# Patient Record
Sex: Female | Born: 1960 | Race: White | Hispanic: No | Marital: Married | Smoking: Former smoker
Health system: Southern US, Community
[De-identification: ages and names within clinical notes are randomized; demographics above are authoritative.]

## PROBLEM LIST (undated history)

## (undated) DIAGNOSIS — F329 Major depressive disorder, single episode, unspecified: Secondary | ICD-10-CM

## (undated) DIAGNOSIS — G43909 Migraine, unspecified, not intractable, without status migrainosus: Secondary | ICD-10-CM

## (undated) DIAGNOSIS — M1990 Unspecified osteoarthritis, unspecified site: Secondary | ICD-10-CM

## (undated) DIAGNOSIS — K219 Gastro-esophageal reflux disease without esophagitis: Secondary | ICD-10-CM

## (undated) DIAGNOSIS — R112 Nausea with vomiting, unspecified: Secondary | ICD-10-CM

## (undated) DIAGNOSIS — N329 Bladder disorder, unspecified: Secondary | ICD-10-CM

## (undated) DIAGNOSIS — R42 Dizziness and giddiness: Secondary | ICD-10-CM

## (undated) DIAGNOSIS — Z9889 Other specified postprocedural states: Secondary | ICD-10-CM

## (undated) DIAGNOSIS — M4802 Spinal stenosis, cervical region: Secondary | ICD-10-CM

## (undated) HISTORY — DX: Major depressive disorder, single episode, unspecified: F32.9

## (undated) HISTORY — DX: Unspecified osteoarthritis, unspecified site: M19.90

## (undated) HISTORY — DX: Gastro-esophageal reflux disease without esophagitis: K21.9

## (undated) HISTORY — PX: COLONOSCOPY: SHX174

## (undated) HISTORY — DX: Migraine, unspecified, not intractable, without status migrainosus: G43.909

---

## 1990-08-08 HISTORY — PX: OTHER SURGICAL HISTORY: SHX169

## 1997-08-08 HISTORY — PX: VAGINAL HYSTERECTOMY: SUR661

## 1998-05-14 ENCOUNTER — Inpatient Hospital Stay (HOSPITAL_COMMUNITY): Admission: RE | Admit: 1998-05-14 | Discharge: 1998-05-16 | Payer: Self-pay | Admitting: Obstetrics and Gynecology

## 1999-08-31 ENCOUNTER — Ambulatory Visit (HOSPITAL_COMMUNITY): Admission: RE | Admit: 1999-08-31 | Discharge: 1999-08-31 | Payer: Self-pay | Admitting: *Deleted

## 1999-08-31 ENCOUNTER — Encounter: Payer: Self-pay | Admitting: *Deleted

## 2000-12-26 ENCOUNTER — Encounter: Payer: Self-pay | Admitting: Family Medicine

## 2000-12-26 ENCOUNTER — Ambulatory Visit (HOSPITAL_COMMUNITY): Admission: RE | Admit: 2000-12-26 | Discharge: 2000-12-26 | Payer: Self-pay | Admitting: Family Medicine

## 2007-05-28 ENCOUNTER — Encounter: Payer: Self-pay | Admitting: Family Medicine

## 2008-06-02 ENCOUNTER — Encounter: Payer: Self-pay | Admitting: Family Medicine

## 2009-08-08 LAB — HM PAP SMEAR

## 2009-09-23 ENCOUNTER — Ambulatory Visit: Payer: Self-pay | Admitting: Family Medicine

## 2009-09-23 DIAGNOSIS — K219 Gastro-esophageal reflux disease without esophagitis: Secondary | ICD-10-CM

## 2009-09-23 DIAGNOSIS — F3289 Other specified depressive episodes: Secondary | ICD-10-CM | POA: Insufficient documentation

## 2009-09-23 DIAGNOSIS — F329 Major depressive disorder, single episode, unspecified: Secondary | ICD-10-CM

## 2009-09-23 LAB — CONVERTED CEMR LAB
Bilirubin Urine: NEGATIVE
Glucose, Urine, Semiquant: NEGATIVE
Ketones, urine, test strip: NEGATIVE
Nitrite: NEGATIVE
Protein, U semiquant: NEGATIVE
Specific Gravity, Urine: 1.025
Urobilinogen, UA: 0.2
pH: 7

## 2009-09-24 LAB — CONVERTED CEMR LAB
ALT: 17 units/L (ref 0–35)
AST: 21 units/L (ref 0–37)
Albumin: 4.3 g/dL (ref 3.5–5.2)
Alkaline Phosphatase: 63 units/L (ref 39–117)
BUN: 16 mg/dL (ref 6–23)
Basophils Absolute: 0 10*3/uL (ref 0.0–0.1)
Basophils Relative: 0.5 % (ref 0.0–3.0)
Bilirubin, Direct: 0 mg/dL (ref 0.0–0.3)
CO2: 33 meq/L — ABNORMAL HIGH (ref 19–32)
Calcium: 9.2 mg/dL (ref 8.4–10.5)
Chloride: 106 meq/L (ref 96–112)
Cholesterol: 202 mg/dL — ABNORMAL HIGH (ref 0–200)
Creatinine, Ser: 0.7 mg/dL (ref 0.4–1.2)
Direct LDL: 103.1 mg/dL
Eosinophils Absolute: 0.1 10*3/uL (ref 0.0–0.7)
Eosinophils Relative: 2.8 % (ref 0.0–5.0)
GFR calc non Af Amer: 95.34 mL/min (ref >60)
Glucose, Bld: 92 mg/dL (ref 70–99)
HCT: 37.3 % (ref 36.0–46.0)
HDL: 96.8 mg/dL (ref >39.00)
Hemoglobin: 12.6 g/dL (ref 12.0–15.0)
Lymphocytes Relative: 41.1 % (ref 12.0–46.0)
Lymphs Abs: 1.9 10*3/uL (ref 0.7–4.0)
MCHC: 33.8 g/dL (ref 30.0–36.0)
MCV: 90.3 fL (ref 78.0–100.0)
Monocytes Absolute: 0.3 10*3/uL (ref 0.1–1.0)
Monocytes Relative: 6.7 % (ref 3.0–12.0)
Neutro Abs: 2.4 10*3/uL (ref 1.4–7.7)
Neutrophils Relative %: 48.9 % (ref 43.0–77.0)
Platelets: 293 10*3/uL (ref 150.0–400.0)
Potassium: 4.2 meq/L (ref 3.5–5.1)
RBC: 4.14 M/uL (ref 3.87–5.11)
RDW: 13 % (ref 11.5–14.6)
Sodium: 143 meq/L (ref 135–145)
TSH: 0.42 microintl units/mL (ref 0.35–5.50)
Total Bilirubin: 0.3 mg/dL (ref 0.3–1.2)
Total CHOL/HDL Ratio: 2
Total Protein: 6.5 g/dL (ref 6.0–8.3)
Triglycerides: 49 mg/dL (ref 0.0–149.0)
VLDL: 9.8 mg/dL (ref 0.0–40.0)
WBC: 4.7 10*3/uL (ref 4.5–10.5)

## 2010-01-14 ENCOUNTER — Telehealth: Payer: Self-pay | Admitting: Family Medicine

## 2010-05-03 ENCOUNTER — Telehealth: Payer: Self-pay | Admitting: Family Medicine

## 2010-08-29 ENCOUNTER — Encounter: Payer: Self-pay | Admitting: Obstetrics and Gynecology

## 2010-09-09 NOTE — Assessment & Plan Note (Signed)
Summary: to be est/pt will come in fasting/njr   Vital Signs:  Patient profile:   50 year old female Menstrual status:  hysterectomy Height:      66.25 inches Weight:      151 pounds BMI:     24.28 Temp:     98.7 degrees F oral Pulse rate:   72 / minute Pulse rhythm:   regular Resp:     12 per minute BP sitting:   110 / 70  (left arm) Cuff size:   regular  Vitals Entered By: Sid Falcon LPN (September 23, 2009 10:22 AM) CC: New to establish, discuss meds     Menstrual Status hysterectomy   History of Present Illness: New patient here for complete physical.  Past medical history reviewed. History of migraine headaches with imipramine for prophylaxis. Recently ran out needs refills. Remote history of anemia. Rare episodes of GERD. Prior history of depressive symptoms but never treated for depression.  Partial hysterectomy 1999 with no history of cervix or uterine cancer. 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 Grandmother with congestive heart failure.  Patient married has 2 children. Works as a Veterinary surgeon. Quit smoking 2 years ago. Occasional alcohol use.  Recent problems with hot flashes. Previously on Premarin. Would like to consider going back on. Has tried black cohosh without much improvement. Occasional loss of hair and decreased sleep. No lab work in several years. No mammogram in 5-6 years.  Preventive Screening-Counseling & Management  Alcohol-Tobacco     Smoking Status: quit > 6 months     Year Quit: 2008  Caffeine-Diet-Exercise     Does Patient Exercise: no  Allergies (verified): 1)  Codeine Sulfate (Codeine Sulfate)  Past History:  Social History: Last updated: 09/23/2009 Occupation: Alcohol use-yes Regular exercise-no Pregnancies 2 Live births 2  Risk Factors: Exercise: no (09/23/2009)  Risk Factors: Smoking Status: quit > 6 months (09/23/2009)  Past Medical  History: Anemia Chicken pox Depression GERD Hay fever, allergies Migraines UTI  Past Surgical History: Left breast biopsy 1992 Partial Hyst 1999  Family History: Family History Hypertension Family History of Prostate CA 1st degree relative <50  Social History: Occupation: Alcohol use-yes Regular exercise-no Pregnancies 2 Live births 2 Smoking Status:  quit > 6 months Occupation:  employed Does Patient Exercise:  no  Review of Systems  The patient denies anorexia, fever, weight loss, weight gain, vision loss, chest pain, syncope, dyspnea on exertion, peripheral edema, prolonged cough, headaches, hemoptysis, abdominal pain, melena, hematochezia, severe indigestion/heartburn, hematuria, incontinence, genital sores, muscle weakness, suspicious skin lesions, depression, abnormal bleeding, enlarged lymph nodes, and breast masses.    Physical Exam  General:  Well-developed,well-nourished,in no acute distress; alert,appropriate and cooperative throughout examination Head:  Normocephalic and atraumatic without obvious abnormalities. No apparent alopecia or balding. Eyes:  No corneal or conjunctival inflammation noted. EOMI. Perrla. Funduscopic exam benign, without hemorrhages, exudates or papilledema. Vision grossly normal. Ears:  External ear exam shows no significant lesions or deformities.  Otoscopic examination reveals clear canals, tympanic membranes are intact bilaterally without bulging, retraction, inflammation or discharge. Hearing is grossly normal bilaterally. Mouth:  Oral mucosa and oropharynx without lesions or exudates.  Teeth in good repair. Neck:  No deformities, masses, or tenderness noted. Breasts:  No mass, nodules, thickening, tenderness, bulging, retraction, inflamation, nipple discharge or skin changes noted.   Lungs:  Normal respiratory effort, chest expands symmetrically. Lungs are clear to auscultation, no crackles or wheezes. Heart:  Normal rate  and regular  rhythm. S1 and S2 normal without gallop, murmur, click, rub or other extra sounds. Abdomen:  Bowel sounds positive,abdomen soft and non-tender without masses, organomegaly or hernias noted. Genitalia:  normal introitus, no external lesions, mucosa pink and moist, and no adnexal masses or tenderness.  No uterus. Msk:  full rom hips.  Minimal tender prox Sartorius near origin and exacerbated with hip flexion. Extremities:  No clubbing, cyanosis, edema, or deformity noted with normal full range of motion of all joints.   Neurologic:  alert & oriented X3, cranial nerves II-XII intact, and strength normal in all extremities.   Skin:  Intact without suspicious lesions or rashes Cervical Nodes:  No lymphadenopathy noted Psych:  normally interactive, good eye contact, not anxious appearing, and not depressed appearing.     Impression & Recommendations:  Problem # 1:  Preventive Health Care (ICD-V70.0) needs mammogram.  If normal will consider going back short term at least on Premarin.  Ca and Vit D supplement discussed. Screening labs ordered.   Exercise.  No indication for pap with prior hysterectomy.  Complete Medication List: 1)  Imipramine Hcl 50 Mg Tabs (Imipramine hcl) .... Once daily 2)  Fish Oil 1200 Mg Caps (Omega-3 fatty acids) .... Once daily 3)  Womens Multivitamin Plus Tabs (Multiple vitamins-minerals) .... Once daily 4)  Vitamin B-12 2500 Mcg Subl (Cyanocobalamin) .... Once daily 5)  [facility] Biotin 5000 Mcg Caps (Biotin) .... Once daily 6)  Calcium + D + K 750-500-40 Mg-unt-mcg Tabs (Calcium-vitamin d-vitamin k) .... Once daily  Other Orders: TLB-Lipid Panel (80061-LIPID) TLB-BMP (Basic Metabolic Panel-BMET) (80048-METABOL) TLB-CBC Platelet - w/Differential (85025-CBCD) TLB-Hepatic/Liver Function Pnl (80076-HEPATIC) TLB-TSH (Thyroid Stimulating Hormone) (84443-TSH) UA Dipstick w/o Micro (manual) (16109) Venipuncture (60454)  Patient Instructions: 1)  It is important that you  exercise reguarly at least 20 minutes 5 times a week. If you develop chest pain, have severe difficulty breathing, or feel very tired, stop exercising immediately and seek medical attention.  2)  Schedule your mammogram.  3)  Take calcium +vitamin D daily.  Prescriptions: IMIPRAMINE HCL 50 MG TABS (IMIPRAMINE HCL) once daily  #90 x 3   Entered and Authorized by:   Evelena Peat MD   Signed by:   Evelena Peat MD on 09/23/2009   Method used:   Electronically to        [facility] [phone]* (retail)       [address]       [place], [place]  [address]       Ph: [phone] or [phone]       Fax: [phone]   RxID:   [id number]   Preventive Care Screening  Last Tetanus Booster:    Date:  08/09/2003    Results:  Historical   Laboratory Results   Urine Tests  Date/Time Recieved: September 23, 2009 12:09 PM  Date/Time Reported: September 23, 2009 12:09 PM   Routine Urinalysis   Color: yellow Appearance: Clear Glucose: negative   (Normal Range: Negative) Bilirubin: negative   (Normal Range: Negative) Ketone: negative   (Normal Range: Negative) Spec. Gravity: 1.025   (Normal Range: 1.003-1.035) Blood: trace-lysed   (Normal Range: Negative) pH: 7.0   (Normal Range: 5.0-8.0) Protein: negative   (Normal Range: Negative) Urobilinogen: 0.2   (Normal Range: 0-1) Nitrite: negative   (Normal Range: Negative) Leukocyte Esterace: 1+   (Normal Range: Negative)    Comments: Wynona Canes, CMA  September 23, 2009 12:09 PM

## 2010-09-09 NOTE — Progress Notes (Signed)
Summary: MED REFILL   Phone Note Refill Request Message from:  Patient  Refills Requested: Medication #1:  PREMARIN 0.625 MG TABS once daily.  Medication #2:  IMIPRAMINE HCL 50 MG TABS once daily [facility] [phone]  Initial call taken by: Heron Sabins,  May 03, 2010 11:50 AM    Prescriptions: PREMARIN 0.625 MG TABS (ESTROGENS CONJUGATED) once daily  #30 Tablet x 5   Entered by:   Sid Falcon LPN   Authorized by:   Evelena Peat MD   Signed by:   Sid Falcon LPN on 11/91/4782   Method used:   Electronically to        [facility] [phone]* (retail)       [address]       [place], [place]  [address]       Ph: [phone] or [phone]       Fax: [phone]   RxID:   [id number] IMIPRAMINE HCL 50 MG TABS (IMIPRAMINE HCL) once daily  #90 x 3   Entered by:   Sid Falcon LPN   Authorized by:   Evelena Peat MD   Signed by:   Sid Falcon LPN on 33/29/5188   Method used:   Electronically to        [facility] [phone]* (retail)       [address]       [place], [place]  [address]       Ph: [phone] or [phone]       Fax: [phone]   RxID:   [id number]

## 2010-09-09 NOTE — Progress Notes (Signed)
Summary: Premarin Rx request  Phone Note Call from Patient   Caller: Patient Call For: Evelena Peat MD Summary of Call: Letter received from pt (on Dr Lucie Leather desk).  Pt had  mammogram at [facility] on 5/27 and they mailed her a letter with results her mammogram showed no breast abnormalities.  Premarin was discussed at our last OV.  If Dr Caryl Never agrees, pt would like Rx for Premarin to go to [facility].  Pt does not remember dose she was on, [facility]. Initial call taken by: Sid Falcon LPN,  January 14, 2129 5:09 PM  Follow-up for Phone Call        Premarin 0.625 mg once daily and office f/u to reassess in 1-2 months. Follow-up by: Evelena Peat MD,  January 14, 2010 5:59 PM  Additional Follow-up for Phone Call Additional follow up Details #1::        Pt informed on home VM Additional Follow-up by: Sid Falcon LPN,  January 15, 2010 9:13 AM    New/Updated Medications: PREMARIN 0.625 MG TABS (ESTROGENS CONJUGATED) once daily Prescriptions: PREMARIN 0.625 MG TABS (ESTROGENS CONJUGATED) once daily  #30 x 2   Entered by:   Sid Falcon LPN   Authorized by:   Evelena Peat MD   Signed by:   Sid Falcon LPN on 86/57/8469   Method used:   Electronically to        [facility] [phone]* (retail)       [address]       [place], [place]  [address]       Ph: [phone] or [phone]       Fax: [phone]   RxID:   [id number]

## 2010-09-14 ENCOUNTER — Telehealth: Payer: Self-pay | Admitting: *Deleted

## 2010-09-14 DIAGNOSIS — E28319 Asymptomatic premature menopause: Secondary | ICD-10-CM

## 2010-09-14 MED ORDER — ESTROGENS CONJUGATED 0.625 MG PO TABS: 0.6250 mg | ORAL_TABLET | Freq: Every day | ORAL | Status: DC

## 2010-09-14 NOTE — Telephone Encounter (Signed)
Pt insurance will no longer cover Premarin.  Will cover Estradiol, Estropipate, Cenestin, Enjuvia. [facility]

## 2010-09-14 NOTE — Telephone Encounter (Signed)
Addended by: Sid Falcon on: 09/14/2010 05:47 PM   Modules accepted: Orders

## 2010-09-14 NOTE — Telephone Encounter (Signed)
Pt informed on home VM needs return annual OV

## 2010-10-06 ENCOUNTER — Encounter: Payer: Self-pay | Admitting: Family Medicine

## 2010-10-06 ENCOUNTER — Ambulatory Visit (INDEPENDENT_AMBULATORY_CARE_PROVIDER_SITE_OTHER): Payer: 59 | Admitting: Family Medicine

## 2010-10-06 DIAGNOSIS — F329 Major depressive disorder, single episode, unspecified: Secondary | ICD-10-CM

## 2010-10-06 DIAGNOSIS — R232 Flushing: Secondary | ICD-10-CM

## 2010-10-06 DIAGNOSIS — N951 Menopausal and female climacteric states: Secondary | ICD-10-CM

## 2010-10-06 MED ORDER — IMIPRAMINE HCL 50 MG PO TABS: 50.0000 mg | ORAL_TABLET | Freq: Every day | ORAL | Status: DC

## 2010-10-06 MED ORDER — ESTRADIOL 1 MG PO TABS: 1.0000 mg | ORAL_TABLET | Freq: Every day | ORAL | Status: DC

## 2010-10-06 NOTE — Patient Instructions (Signed)
Consider complete physical at some point later this year  Recommend continue yearly mammograms

## 2010-10-06 NOTE — Progress Notes (Signed)
  Subjective:    Patient ID: Kelly Gilbert, female    DOB: [birth year], 50 y.o.   MRN: [id number]  HPI  Patient here to discuss some postmenopausal issues. Prior history of partial hysterectomy. Hot flashes for several years. Had been on Premarin but insurance is no longer covering. Would like to explore other sources of estrogen. Mammogram last year normal. No history of liver problems or any clotting disorder. Still smokes but very rarely. Lipids have been favorable the past. No diabetes or hypertension history.  Also history of migraine and takes Imipramine for prevention.  Needs refills.  No frequent migraines.  Review of Systems  Constitutional: Negative for fever, activity change, appetite change and fatigue.  Eyes: Negative for visual disturbance.  Respiratory: Negative for cough, shortness of breath and wheezing.   Cardiovascular: Negative for chest pain, palpitations and leg swelling.  Gastrointestinal: Negative for abdominal pain.  Genitourinary: Negative for dysuria and frequency.  Neurological: Negative for dizziness and syncope.  Hematological: Negative for adenopathy.       Objective:   Physical Exam  patient is alert and healthy in appearance  neck is supple no adenopathy or thyromegaly Chest clear to auscultation Heart regular rhythm and rate with no murmur Extremities no edema Neuro-no focal strength deficits.       Assessment & Plan:  #1  postmenopausal with ongoing hot flashes. Long discussion with patient regarding pros and cons of hormonal therapy. We've strongly recommend yearly mammogram. Changed to estradiol #2 migraine headaches.  Refill Imipramine.

## 2011-09-03 ENCOUNTER — Other Ambulatory Visit: Payer: Self-pay | Admitting: Family Medicine

## 2011-10-05 ENCOUNTER — Other Ambulatory Visit (INDEPENDENT_AMBULATORY_CARE_PROVIDER_SITE_OTHER): Payer: 59

## 2011-10-05 DIAGNOSIS — Z0000 Encounter for general adult medical examination without abnormal findings: Secondary | ICD-10-CM

## 2011-10-05 LAB — POCT URINALYSIS DIPSTICK
Ketones, UA: NEGATIVE
Leukocytes, UA: NEGATIVE
Nitrite, UA: NEGATIVE
Urobilinogen, UA: 0.2
pH, UA: 5.5

## 2011-10-05 LAB — CBC WITH DIFFERENTIAL/PLATELET
Basophils Absolute: 0 10*3/uL (ref 0.0–0.1)
Basophils Relative: 0.6 % (ref 0.0–3.0)
Eosinophils Absolute: 0.2 10*3/uL (ref 0.0–0.7)
HCT: 38.8 % (ref 36.0–46.0)
Hemoglobin: 12.8 g/dL (ref 12.0–15.0)
Lymphs Abs: 1.6 10*3/uL (ref 0.7–4.0)
MCHC: 33.1 g/dL (ref 30.0–36.0)
MCV: 89.7 fl (ref 78.0–100.0)
Neutro Abs: 3 10*3/uL (ref 1.4–7.7)
RBC: 4.32 Mil/uL (ref 3.87–5.11)
RDW: 14.5 % (ref 11.5–14.6)

## 2011-10-05 LAB — BASIC METABOLIC PANEL
CO2: 25 mEq/L (ref 19–32)
Chloride: 106 mEq/L (ref 96–112)
Glucose, Bld: 88 mg/dL (ref 70–99)
Potassium: 4.3 mEq/L (ref 3.5–5.1)
Sodium: 139 mEq/L (ref 135–145)

## 2011-10-05 LAB — TSH: TSH: 0.46 u[IU]/mL (ref 0.35–5.50)

## 2011-10-05 LAB — LIPID PANEL
Cholesterol: 212 mg/dL — ABNORMAL HIGH (ref 0–200)
VLDL: 15.6 mg/dL (ref 0.0–40.0)

## 2011-10-05 LAB — HEPATIC FUNCTION PANEL
ALT: 11 U/L (ref 0–35)
Albumin: 3.9 g/dL (ref 3.5–5.2)
Total Protein: 6.5 g/dL (ref 6.0–8.3)

## 2011-10-06 ENCOUNTER — Other Ambulatory Visit: Payer: Self-pay | Admitting: Family Medicine

## 2011-10-12 ENCOUNTER — Ambulatory Visit (INDEPENDENT_AMBULATORY_CARE_PROVIDER_SITE_OTHER): Payer: 59 | Admitting: Family Medicine

## 2011-10-12 ENCOUNTER — Encounter: Payer: Self-pay | Admitting: Family Medicine

## 2011-10-12 VITALS — BP 130/90 | HR 80 | Temp 97.6°F | Resp 12 | Ht 66.0 in | Wt 160.0 lb

## 2011-10-12 DIAGNOSIS — R319 Hematuria, unspecified: Secondary | ICD-10-CM

## 2011-10-12 DIAGNOSIS — Z8669 Personal history of other diseases of the nervous system and sense organs: Secondary | ICD-10-CM | POA: Insufficient documentation

## 2011-10-12 DIAGNOSIS — Z0000 Encounter for general adult medical examination without abnormal findings: Secondary | ICD-10-CM

## 2011-10-12 LAB — POCT URINALYSIS DIPSTICK
Bilirubin, UA: NEGATIVE
Leukocytes, UA: NEGATIVE
Nitrite, UA: NEGATIVE
Protein, UA: NEGATIVE
pH, UA: 6.5

## 2011-10-12 MED ORDER — IMIPRAMINE HCL 50 MG PO TABS: 50.0000 mg | ORAL_TABLET | Freq: Every day | ORAL | Status: DC

## 2011-10-12 MED ORDER — ESTRADIOL 1 MG PO TABS: 1.0000 mg | ORAL_TABLET | Freq: Every day | ORAL | Status: DC

## 2011-10-12 NOTE — Patient Instructions (Signed)

## 2011-10-12 NOTE — Progress Notes (Signed)
  Subjective:    Patient ID: Kelly Gilbert, female    DOB: [birth year], 51 y.o.   MRN: [id number]  HPI  Complete physical. Patient has history of hysterectomy for benign disease. She has been maintained on estrogen replacement for several years. Has occasional hot flashes. History of migraine headaches prevented with imipramine. Needs refills of these medications.  Tetanus 2005. She has recently resumed smoking. Only smokes a few cigarettes per day.  Issues of difficulty falling asleep. She does have a long history of several years of mild low-grade depression. Previously on Wellbutrin for smoking cessation but had side effects. Has some low motivation. No suicidal thoughts.  Past Medical History  Diagnosis Date  . DEPRESSION 09/23/2009  . GERD 09/23/2009  . Migraine headache    Past Surgical History  Procedure Date  . Breast surgery 1992    biopsy  . Abdominal hysterectomy 1999    partial     reports that she quit smoking about 5 years ago. Her smoking use included Cigarettes. She has a 15 pack-year smoking history. She does not have any smokeless tobacco history on file. Her alcohol and drug histories not on file. family history includes Cancer in her father; Cancer (age of onset:58) in her mother; and Hypertension in her father and maternal grandmother. Allergies  Allergen Reactions  . Codeine Sulfate     REACTION: nausea  . Sulfa Antibiotics     hives      Review of Systems  Constitutional: Negative for fever, activity change, appetite change, fatigue and unexpected weight change.  HENT: Negative for hearing loss, ear pain, sore throat and trouble swallowing.   Eyes: Negative for visual disturbance.  Respiratory: Negative for cough and shortness of breath.   Cardiovascular: Negative for chest pain and palpitations.  Gastrointestinal: Negative for abdominal pain, diarrhea, constipation and blood in stool.  Genitourinary: Negative for dysuria and hematuria.    Musculoskeletal: Negative for myalgias, back pain and arthralgias.  Skin: Negative for rash.  Neurological: Negative for dizziness, syncope and headaches.  Hematological: Negative for adenopathy.  Psychiatric/Behavioral: Positive for sleep disturbance and dysphoric mood. Negative for suicidal ideas and confusion. The patient is not nervous/anxious.        Objective:   Physical Exam  Constitutional: She is oriented to person, place, and time. She appears well-developed and well-nourished. No distress.  HENT:  Right Ear: External ear normal.  Left Ear: External ear normal.  Mouth/Throat: Oropharynx is clear and moist.  Neck: Neck supple. No thyromegaly present.  Cardiovascular: Normal rate and regular rhythm.   Pulmonary/Chest: Effort normal and breath sounds normal. No respiratory distress. She has no wheezes. She has no rales. She exhibits no tenderness.  Genitourinary:       Breasts symmetric with no mass  Lymphadenopathy:    She has no cervical adenopathy.  Neurological: She is alert and oriented to person, place, and time. No cranial nerve deficit.  Skin: No rash noted.  Psychiatric: She has a normal mood and affect. Her behavior is normal.          Assessment & Plan:  #1  CPE Pt needs mammogram.  Colonoscopy by next year.  Smoking cessation discussed #2 Hematuria on urine dipstick .  Repeat today again shows 1+ blood.  Urology referral #3 depressed mood.  ?dysthymia.  Several year hx of low grade depression. No clear indication for additional meds at this time.

## 2011-11-07 ENCOUNTER — Other Ambulatory Visit: Payer: Self-pay | Admitting: Urology

## 2011-12-05 ENCOUNTER — Other Ambulatory Visit: Payer: Self-pay | Admitting: Urology

## 2011-12-19 ENCOUNTER — Encounter (HOSPITAL_BASED_OUTPATIENT_CLINIC_OR_DEPARTMENT_OTHER): Admission: RE | Payer: Self-pay | Source: Ambulatory Visit

## 2011-12-19 ENCOUNTER — Ambulatory Visit (HOSPITAL_BASED_OUTPATIENT_CLINIC_OR_DEPARTMENT_OTHER): Admission: RE | Admit: 2011-12-19 | Payer: 59 | Source: Ambulatory Visit | Admitting: Urology

## 2011-12-19 SURGERY — CYSTOSCOPY, WITH BIOPSY
Anesthesia: General

## 2012-01-09 ENCOUNTER — Encounter (HOSPITAL_BASED_OUTPATIENT_CLINIC_OR_DEPARTMENT_OTHER): Payer: Self-pay | Admitting: *Deleted

## 2012-01-09 NOTE — Progress Notes (Signed)
NPO AFTER MN. ARRIVES AT 1115. NEEDS HG.

## 2012-01-16 ENCOUNTER — Encounter (HOSPITAL_BASED_OUTPATIENT_CLINIC_OR_DEPARTMENT_OTHER)
Admission: RE | Disposition: A | Discharge status: Home / Self Care | Payer: Self-pay | Source: Ambulatory Visit | Attending: Urology

## 2012-01-16 ENCOUNTER — Encounter (HOSPITAL_BASED_OUTPATIENT_CLINIC_OR_DEPARTMENT_OTHER): Payer: Self-pay | Admitting: Anesthesiology

## 2012-01-16 ENCOUNTER — Ambulatory Visit (HOSPITAL_BASED_OUTPATIENT_CLINIC_OR_DEPARTMENT_OTHER): Payer: BC Managed Care – PPO | Admitting: Anesthesiology

## 2012-01-16 ENCOUNTER — Encounter (HOSPITAL_BASED_OUTPATIENT_CLINIC_OR_DEPARTMENT_OTHER): Payer: Self-pay | Admitting: *Deleted

## 2012-01-16 ENCOUNTER — Ambulatory Visit (HOSPITAL_BASED_OUTPATIENT_CLINIC_OR_DEPARTMENT_OTHER)
Admission: RE | Admit: 2012-01-16 | Discharge: 2012-01-16 | Disposition: A | Discharge status: Home / Self Care | Payer: BC Managed Care – PPO | Source: Ambulatory Visit | Attending: Urology | Admitting: Urology

## 2012-01-16 DIAGNOSIS — N329 Bladder disorder, unspecified: Secondary | ICD-10-CM | POA: Insufficient documentation

## 2012-01-16 DIAGNOSIS — R3129 Other microscopic hematuria: Secondary | ICD-10-CM | POA: Insufficient documentation

## 2012-01-16 DIAGNOSIS — F17200 Nicotine dependence, unspecified, uncomplicated: Secondary | ICD-10-CM | POA: Insufficient documentation

## 2012-01-16 HISTORY — DX: Other specified postprocedural states: Z98.890

## 2012-01-16 HISTORY — DX: Bladder disorder, unspecified: N32.9

## 2012-01-16 HISTORY — DX: Other specified postprocedural states: R11.2

## 2012-01-16 HISTORY — PX: CYSTOSCOPY WITH BIOPSY: SHX5122

## 2012-01-16 SURGERY — CYSTOSCOPY, WITH BIOPSY
Anesthesia: General | Site: Bladder | Wound class: Clean Contaminated

## 2012-01-16 MED ORDER — ONDANSETRON HCL 4 MG/2ML IJ SOLN
INTRAMUSCULAR | Status: DC | PRN
  Administered 2012-01-16: 4 mg via INTRAVENOUS

## 2012-01-16 MED ORDER — OXYBUTYNIN CHLORIDE 5 MG PO TABS: 5.0000 mg | ORAL_TABLET | Freq: Four times a day (QID) | ORAL | Status: DC | PRN

## 2012-01-16 MED ORDER — SENNOSIDES-DOCUSATE SODIUM 8.6-50 MG PO TABS: 1.0000 | ORAL_TABLET | Freq: Two times a day (BID) | ORAL | Status: AC

## 2012-01-16 MED ORDER — KETOROLAC TROMETHAMINE 30 MG/ML IJ SOLN
INTRAMUSCULAR | Status: DC | PRN
  Administered 2012-01-16: 30 mg via INTRAVENOUS

## 2012-01-16 MED ORDER — HYOSCYAMINE SULFATE 0.125 MG PO TABS: 0.1250 mg | ORAL_TABLET | ORAL | Status: DC | PRN

## 2012-01-16 MED ORDER — CEPHALEXIN 500 MG PO CAPS: 500.0000 mg | ORAL_CAPSULE | Freq: Three times a day (TID) | ORAL | Status: AC

## 2012-01-16 MED ORDER — FENTANYL CITRATE 0.05 MG/ML IJ SOLN: 25.0000 ug | INTRAMUSCULAR | Status: DC | PRN

## 2012-01-16 MED ORDER — LIDOCAINE HCL (CARDIAC) 20 MG/ML IV SOLN
INTRAVENOUS | Status: DC | PRN
  Administered 2012-01-16: 50 mg via INTRAVENOUS

## 2012-01-16 MED ORDER — LACTATED RINGERS IV SOLN
INTRAVENOUS | Status: DC
  Administered 2012-01-16: 12:00:00 via INTRAVENOUS

## 2012-01-16 MED ORDER — MIDAZOLAM HCL 5 MG/5ML IJ SOLN
INTRAMUSCULAR | Status: DC | PRN
  Administered 2012-01-16: 2 mg via INTRAVENOUS

## 2012-01-16 MED ORDER — PHENAZOPYRIDINE HCL 100 MG PO TABS: 100.0000 mg | ORAL_TABLET | Freq: Three times a day (TID) | ORAL | Status: AC | PRN

## 2012-01-16 MED ORDER — SCOPOLAMINE 1 MG/3DAYS TD PT72
1.0000 | MEDICATED_PATCH | TRANSDERMAL | Status: DC
  Administered 2012-01-16: 1.5 mg via TRANSDERMAL

## 2012-01-16 MED ORDER — STERILE WATER FOR IRRIGATION IR SOLN
Status: DC | PRN
  Administered 2012-01-16: 3000 mL

## 2012-01-16 MED ORDER — HYDROCODONE-ACETAMINOPHEN 5-325 MG PO TABS: 1.0000 | ORAL_TABLET | ORAL | Status: AC | PRN

## 2012-01-16 MED ORDER — FENTANYL CITRATE 0.05 MG/ML IJ SOLN
INTRAMUSCULAR | Status: DC | PRN
  Administered 2012-01-16: 100 ug via INTRAVENOUS

## 2012-01-16 MED ORDER — BELLADONNA ALKALOIDS-OPIUM 16.2-60 MG RE SUPP
RECTAL | Status: DC | PRN
  Administered 2012-01-16: 1 via RECTAL

## 2012-01-16 MED ORDER — PROPOFOL 10 MG/ML IV EMUL
INTRAVENOUS | Status: DC | PRN
  Administered 2012-01-16: 200 mg via INTRAVENOUS

## 2012-01-16 MED ORDER — CEFAZOLIN SODIUM 1-5 GM-% IV SOLN
1.0000 g | INTRAVENOUS | Status: AC
  Administered 2012-01-16: 1 g via INTRAVENOUS

## 2012-01-16 MED ORDER — CEPHALEXIN 500 MG PO CAPS: 500.0000 mg | ORAL_CAPSULE | Freq: Three times a day (TID) | ORAL | Status: DC

## 2012-01-16 MED ORDER — DEXAMETHASONE SODIUM PHOSPHATE 4 MG/ML IJ SOLN
INTRAMUSCULAR | Status: DC | PRN
  Administered 2012-01-16: 10 mg via INTRAVENOUS

## 2012-01-16 SURGICAL SUPPLY — 18 items
BAG DRAIN URO-CYSTO SKYTR STRL (DRAIN) ×2 IMPLANT
BAG DRN ANRFLXCHMBR STRAP LEK (BAG) ×1
BAG DRN UROCATH (DRAIN) ×1
BAG URINE LEG 19OZ MD ST LTX (BAG) ×1 IMPLANT
CANISTER SUCT LVC 12 LTR MEDI- (MISCELLANEOUS) ×1 IMPLANT
CATH FOLEY 2WAY SLVR  5CC 18FR (CATHETERS) ×1
CATH FOLEY 2WAY SLVR 5CC 18FR (CATHETERS) IMPLANT
CLOTH BEACON ORANGE TIMEOUT ST (SAFETY) ×2 IMPLANT
DRAPE CAMERA CLOSED 9X96 (DRAPES) ×2 IMPLANT
ELECT REM PT RETURN 9FT ADLT (ELECTROSURGICAL) ×2
ELECTRODE REM PT RTRN 9FT ADLT (ELECTROSURGICAL) ×1 IMPLANT
GLOVE ECLIPSE 7.0 STRL STRAW (GLOVE) ×2 IMPLANT
GLOVE INDICATOR 7.5 STRL GRN (GLOVE) ×2 IMPLANT
GOWN STRL REIN XL XLG (GOWN DISPOSABLE) ×2 IMPLANT
NEEDLE HYPO 22GX1.5 SAFETY (NEEDLE) IMPLANT
NS IRRIG 500ML POUR BTL (IV SOLUTION) IMPLANT
PACK CYSTOSCOPY (CUSTOM PROCEDURE TRAY) ×2 IMPLANT
WATER STERILE IRR 3000ML UROMA (IV SOLUTION) ×2 IMPLANT

## 2012-01-16 NOTE — Anesthesia Postprocedure Evaluation (Signed)
  Anesthesia Post-op Note  Patient: Kelly Gilbert  Procedure(s) Performed: Procedure(s) (LRB): CYSTOSCOPY WITH BIOPSY (N/A)  Patient Location: PACU  Anesthesia Type: General  Level of Consciousness: awake and alert   Airway and Oxygen Therapy: Patient Spontanous Breathing  Post-op Pain: mild  Post-op Assessment: Post-op Vital signs reviewed, Patient's Cardiovascular Status Stable, Respiratory Function Stable, Patent Airway and No signs of Nausea or vomiting  Post-op Vital Signs: stable  Complications: No apparent anesthesia complications

## 2012-01-16 NOTE — Brief Op Note (Signed)
01/16/2012  1:04 PM  PATIENT:  Alexandria Lodge  51 y.o. female  PRE-OPERATIVE DIAGNOSIS:  Bladder Lesion  POST-OPERATIVE DIAGNOSIS:  Bladder Lesion  PROCEDURE:  Procedure(s) (LRB): CYSTOSCOPY  Bladder biopsy  SURGEON:  Surgeon(s) and Role:    * Milford Cage, MD - Primary  PHYSICIAN ASSISTANT:   ASSISTANTS: none   ANESTHESIA:   general  EBL: None  Total I/O In: 100 [I.V.:100] Out: -   BLOOD ADMINISTERED:none  DRAINS: Urinary Catheter (Foley)   LOCAL MEDICATIONS USED:  NONE  SPECIMEN:  Source of Specimen:  bladder.  DISPOSITION OF SPECIMEN:  PATHOLOGY  COUNTS:  YES  TOURNIQUET:  * No tourniquets in log *  DICTATION: .Other Dictation: Dictation Number [id number]  PLAN OF CARE: Discharge to home after PACU  PATIENT DISPOSITION:  PACU - hemodynamically stable.   Delay start of Pharmacological VTE agent (>24hrs) due to surgical blood loss or risk of bleeding: yes

## 2012-01-16 NOTE — Anesthesia Procedure Notes (Signed)
Procedure Name: LMA Insertion Date/Time: 01/16/2012 12:40 PM Performed by: Maris Berger T Pre-anesthesia Checklist: Patient identified, Emergency Drugs available, Suction available and Patient being monitored Patient Re-evaluated:Patient Re-evaluated prior to inductionOxygen Delivery Method: Circle System Utilized Preoxygenation: Pre-oxygenation with 100% oxygen Intubation Type: IV induction Ventilation: Mask ventilation without difficulty LMA: LMA inserted LMA Size: 4.0 Number of attempts: 1 Airway Equipment and Method: bite block Placement Confirmation: positive ETCO2 Tube secured with: Tape Dental Injury: Teeth and Oropharynx as per pre-operative assessment

## 2012-01-16 NOTE — Discharge Instructions (Signed)
DISCHARGE INSTRUCTIONS FOR TRANSURETHRAL SURGERY OF BLADDER ° °MEDICATIONS:  ° °1. DO NOT RESUME YOUR ANY BLOOD THINNERS FOR ONE WEEK. ° °2. Resume all your other meds from home. ° ° ° °ACTIVITY °1. No heavy lifting >10 pounds for 2 weeks °2. No sexual activity for 2 weeks °3. No strenuous activity for 2 weeks °4. No driving while on narcotic pain medications °5. Drink plenty of water °6. Continue to walk at home - you can still get blood clots when you are at  °home, so keep active, but don't over do it. °7. Your urine may have some blood in it - make sure you drink plenty of water,  °call or come to the ER immediately if your catheter stops draining ° °FOLEY CATHETER  (If you go home with a catheter in place.) °1. Make sure your catheter is attached to your leg at all times - do not let  °anything pull on it °2. If the urine is your tube starts looking dark red or if it stops draining,  °call us immediately or come to the ER °3. Drink plenty of water, if you do notice your urine looking darking, sit down,  °relax and drink lots of water °4. You will be given a leg bag as well as an overnight bag for your catheter -  °MAKE SURE ATTACHED TO YOUR LEG AT ALL TIMES WITH TAPE OR LEG STRAP ° °BATHING °1. You can shower.  You may take a bath unless you have a Foley catheter in place. ° °SIGNS/SYMPTOMS TO CALL: °1. Please call us if you have a fever greater than 101.5, uncontrolled  °nausea/vomiting, uncontrolled pain, dizziness, unable to urinate, chest pain, shortness of breath, leg swelling, leg pain, or any other concerns  °or questions. ° °You can reach us at [phone]. ° ° °Post Anesthesia Home Care Instructions ° °Activity: °Get plenty of rest for the remainder of the day. A responsible adult should stay with you for 24 hours following the procedure.  °For the next 24 hours, DO NOT: °-Drive a car °-Operate machinery °-Drink alcoholic beverages °-Take any medication unless instructed by your physician °-Make any  legal decisions or sign important papers. ° °Meals: °Start with liquid foods such as gelatin or soup. Progress to regular foods as tolerated. Avoid greasy, spicy, heavy foods. If nausea and/or vomiting occur, drink only clear liquids until the nausea and/or vomiting subsides. Call your physician if vomiting continues. ° °Special Instructions/Symptoms: °Your throat may feel dry or sore from the anesthesia or the breathing tube placed in your throat during surgery. If this causes discomfort, gargle with warm salt water. The discomfort should disappear within 24 hours. ° °

## 2012-01-16 NOTE — Anesthesia Preprocedure Evaluation (Signed)
Anesthesia Evaluation  Patient identified by MRN, date of birth, ID band Patient awake    Reviewed: Allergy & Precautions, H&P , NPO status , Patient's Chart, lab work & pertinent test results, reviewed documented beta blocker date and time   History of Anesthesia Complications (+) PONV  Airway Mallampati: II TM Distance: >3 FB Neck ROM: Full    Dental  (+) Dental Advisory Given   Pulmonary Current Smoker,  breath sounds clear to auscultation        Cardiovascular Rhythm:Regular Rate:Normal  Denies cardiac symptoms   Neuro/Psych negative neurological ROS  negative psych ROS   GI/Hepatic negative GI ROS, Neg liver ROS,   Endo/Other  negative endocrine ROS  Renal/GU negative Renal ROS   Bladder lesion    Musculoskeletal negative musculoskeletal ROS (+)   Abdominal   Peds negative pediatric ROS (+)  Hematology negative hematology ROS (+)   Anesthesia Other Findings Upper front veneers  Reproductive/Obstetrics negative OB ROS                           Anesthesia Physical Anesthesia Plan  ASA: II  Anesthesia Plan: General   Post-op Pain Management:    Induction: Intravenous  Airway Management Planned: LMA  Additional Equipment:   Intra-op Plan:   Post-operative Plan: Extubation in OR  Informed Consent: I have reviewed the patients History and Physical, chart, labs and discussed the procedure including the risks, benefits and alternatives for the proposed anesthesia with the patient or authorized representative who has indicated his/her understanding and acceptance.   Dental advisory given  Plan Discussed with: CRNA and Surgeon  Anesthesia Plan Comments:         Anesthesia Quick Evaluation

## 2012-01-16 NOTE — H&P (Signed)
Urology History and Physical Exam  CC: Bladder lesion, microscopic hematuria.  HPI:  51 year old female presents for cystoscopy and bladder biopsy. She was found to have an erythematous lesion in her bladder during workup for microscopic hematuria. She has a significant past medical history of smoking as well as dysuria. The erythematous lesion in her bladder was noted on the posterior wall of the bladder to an office cystoscopy. She had evaluation of her upper urinary tract with a CT of abdomen and pelvis, hematuria protocol, on November 03, 2011 which was negative for concerning findings other than some renal cysts.  We have reviewed the risks, benefits, alternatives, and likelihood of achieving goals.     PMH: Past Medical History  Diagnosis Date  . DEPRESSION 09/23/2009  . Migraine headache   . Lesion of bladder   . PONV (postoperative nausea and vomiting)     PSH: Past Surgical History  Procedure Date  . Benign left breaset bx 1992  . Vaginal hysterectomy 1999    EPIDURAL ANES.    Allergies: Allergies  Allergen Reactions  . Codeine Sulfate Nausea And Vomiting  . Sulfa Antibiotics Hives and Swelling    Medications: No prescriptions prior to admission     Social History: History   Social History  . Marital Status: Married    Spouse Name: N/A    Number of Children: N/A  . Years of Education: N/A   Occupational History  . Not on file.   Social History Main Topics  . Smoking status: Current Everyday Smoker -- 0.2 packs/day for 35 years    Types: Cigarettes  . Smokeless tobacco: Not on file   Comment: QUIT SMOKING 2008--  RECENTLY STARTED BACK 7-8 CIG. PER DAY  . Alcohol Use: 2.4 oz/week    4 Glasses of wine per week  . Drug Use: Not on file  . Sexually Active: Not on file   Other Topics Concern  . Not on file   Social History Narrative  . No narrative on file    Family History: Family History  Problem Relation Age of Onset  . Hypertension Father   .  Cancer Father     prostate  . Hypertension Maternal Grandmother   . Cancer Mother 68    renal cell cancer    Review of Systems: Positive: None. Negative: Chest pain, SOB, gross hematuria.  A further 10 point review of systems was negative except what is listed in the HPI.  Physical Exam:  General: No acute distress.  Awake. Head:  Normocephalic.  Atraumatic. ENT:  EOMI.  Mucous membranes moist Neck:  Supple.  No lymphadenopathy. CV:  S1 present. S2 present. Regular rate. Pulmonary: Equal effort bilaterally.  Clear to auscultation bilaterally. Abdomen: Soft.  Non- tender to palpation. Skin:  Normal turgor.  No visible rash. Extremity: No gross deformity of bilateral upper extremities.  No gross deformity of    bilateral lower extremities. Neurologic: Alert. Appropriate mood.   Studies:  No results found for this basename: HGB:2,WBC:2,PLT:2 in the last 72 hours  No results found for this basename: NA:2,K:2,CL:2,CO2:2,BUN:2,CREATININE:2,CALCIUM:2,MAGNESIUM:2,GFRNONAA:2,GFRAA:2 in the last 72 hours   No results found for this basename: PT:2,INR:2,APTT:2 in the last 72 hours   No components found with this basename: ABG:2    Assessment:  Bladder lesion; microscopic hematuria.  Plan: -To OR for cystoscopy and bladder biopsy.

## 2012-01-16 NOTE — Transfer of Care (Signed)
Immediate Anesthesia Transfer of Care Note  Patient: Kelly Gilbert  Procedure(s) Performed: Procedure(s) (LRB): CYSTOSCOPY WITH BIOPSY (N/A)  Patient Location: PACU  Anesthesia Type: General  Level of Consciousness: awake and oriented  Airway & Oxygen Therapy: Patient Spontanous Breathing and Patient connected to nasal cannula oxygen  Post-op Assessment: Report given to PACU RN  Post vital signs: Reviewed and stable  Complications: No apparent anesthesia complications

## 2012-01-17 ENCOUNTER — Encounter (HOSPITAL_BASED_OUTPATIENT_CLINIC_OR_DEPARTMENT_OTHER): Payer: Self-pay | Admitting: Urology

## 2012-01-17 NOTE — Op Note (Signed)
NAMEMarland Kitchen  Kelly, Gilbert NO.:  [id number]  MEDICAL RECORD NO.:  [id number]  LOCATION:                               FACILITY:  [facility]  PHYSICIAN:  Natalia Leatherwood, MD         DATE OF BIRTH:  DATE OF PROCEDURE:  01/16/2012 DATE OF DISCHARGE:  01/16/2012                              OPERATIVE REPORT   SURGEON:  Natalia Leatherwood, MD  ASSISTANT:  None.  PREOPERATIVE DIAGNOSES:  Microscopic hematuria and bladder lesion.  POSTOPERATIVE DIAGNOSES:  Microscopic hematuria and bladder lesion.  PROCEDURES PERFORMED:    1. Cystoscopy. 2. Panendoscopy. 3. Bladder biopsy.  FINDINGS:  Erythematous areas on posterior bladder wall and white area at the trigone.  ESTIMATED BLOOD LOSS:  None.  COMPLICATIONS:  None.  DRAINS:  Foley catheter.  SPECIMEN:  Bladder biopsy sent for permanent pathology.  HISTORY OF PRESENT ILLNESS:  This is a patient who presented with microscopic hematuria.  On workup for this, she had an erythematous bladder area in her posterior bladder on office cystoscopy.  Because of this, I recommended a bladder biopsy.  She had a CT scan of the abdomen and pelvis, which showed no upper tract areas that were concerning.  She presents today for that biopsy.  PROCEDURE:  Informed consent was obtained.  The patient was taken to the operating room.  She was placed in a supine position.  IV antibiotics were infused and general anesthesia was induced.  She had SCD's that were in place and turned on before induction of anesthesia.  As mentioned, she was placed in dorsal lithotomy position making sure to pad all pertinent neurovascular pressure points.  Her genitals were then prepped and draped in the usual sterile fashion.  A time-out was performed, which the correct patient, surgical site, and procedure were all identified and agreed upon by the team.  Following this, a cystoscope was advanced through the urethra and into the bladder.  The bladder was  evaluated in a systematic fashion with a 12 degree and 70- degree lens.  I was able to visualize the entirety of the bladder mucosa.  There is noted to be posterior midline areas of bladder erythema in 2 different spots as well as white area in her bladder trigone.  There was clear efflux coming from both ureters. Next, a cold cup biopsy forceps was used to biopsy the 2 different areas in the posterior bladder wall and in the bladder trigone.  Bugbee electrode was used to fulgurate these areas to maintain hemostasis.  It was noted that the bladder was very thin on the posterior bladder wall, and because of this, I elected to leave the catheter.  There was no bladder perforation noted, but I felt because her bladder be thinner, it would be best to leave the catheter in place.  After the 18-French Foley catheter was placed and 10 cc of sterile water were placed into the balloon, Belladonna and opioid suppository was placed into her rectum. This completed the procedure.  She was placed back into supine position. Anesthesia was reversed.  She was taken to the PACU in stable condition.  ______________________________ Natalia Leatherwood, MD     DW/MEDQ  D:  01/16/2012  T:  01/17/2012  Job:  [id number]

## 2012-04-04 ENCOUNTER — Encounter: Payer: Self-pay | Admitting: Family Medicine

## 2012-04-04 ENCOUNTER — Ambulatory Visit (INDEPENDENT_AMBULATORY_CARE_PROVIDER_SITE_OTHER): Payer: BC Managed Care – PPO | Admitting: Family Medicine

## 2012-04-04 VITALS — BP 130/80 | Temp 98.6°F | Wt 157.0 lb

## 2012-04-04 DIAGNOSIS — R1013 Epigastric pain: Secondary | ICD-10-CM

## 2012-04-04 MED ORDER — IMIPRAMINE HCL 50 MG PO TABS: 50.0000 mg | ORAL_TABLET | Freq: Every day | ORAL | Status: DC

## 2012-04-04 MED ORDER — PANTOPRAZOLE SODIUM 40 MG PO TBEC: 40.0000 mg | DELAYED_RELEASE_TABLET | Freq: Every day | ORAL | Status: DC

## 2012-04-04 NOTE — Progress Notes (Signed)
  Subjective:    Patient ID: Kelly Gilbert, female    DOB: September 27, 1960, 51 y.o.   MRN: [id number]  HPI  Patient is seen for followup from recent abdominal pain. About one week ago she developed some epigastric abdominal pain.  achy pain relatively constant in mid substernal area. Sore to touch. No rash. Went to urgent care yesterday. Ultrasound no gallstones or other abnormality. Pain is actually improved today. No appetite or weight change. No vomiting. No nausea. No exertional symptoms. Patient does take fairly frequent aspirin for intermittent headaches. No recent melena. No history of peptic disease. She's not tried any antacids. No recent stool changes.   Review of Systems  Respiratory: Negative for cough and shortness of breath.   Cardiovascular: Negative for chest pain.  Gastrointestinal: Positive for abdominal pain. Negative for nausea, vomiting, diarrhea and blood in stool.  Neurological: Negative for dizziness.       Objective:   Physical Exam  Constitutional: She appears well-developed and well-nourished.  Neck: Neck supple.  Cardiovascular: Normal rate and regular rhythm.   Pulmonary/Chest: Effort normal and breath sounds normal. No respiratory distress. She has no wheezes. She has no rales.  Abdominal: Soft. Bowel sounds are normal. She exhibits no distension and no mass. There is tenderness. There is no rebound and no guarding.       Mild tenderness epigastric region  Musculoskeletal: She exhibits no edema.          Assessment & Plan:  Epigastric abdominal pain. Recent ultrasound unremarkable. Question gastritis from aspirin. Hold all nonsteroidals. Protonix 40 mg once daily for 3-4 weeks. Touch base 2 weeks if symptoms not fully resolved

## 2012-04-04 NOTE — Patient Instructions (Addendum)
Try to avoid non steroidals for the next several days.

## 2012-09-13 ENCOUNTER — Other Ambulatory Visit (HOSPITAL_COMMUNITY): Payer: Self-pay | Admitting: Urology

## 2012-09-13 DIAGNOSIS — N281 Cyst of kidney, acquired: Secondary | ICD-10-CM

## 2012-10-04 ENCOUNTER — Ambulatory Visit (HOSPITAL_COMMUNITY): Admission: RE | Admit: 2012-10-04 | Payer: BC Managed Care – PPO | Source: Ambulatory Visit

## 2012-10-26 ENCOUNTER — Other Ambulatory Visit: Payer: Self-pay | Admitting: Family Medicine

## 2012-11-01 ENCOUNTER — Ambulatory Visit (HOSPITAL_COMMUNITY): Payer: BC Managed Care – PPO

## 2012-12-24 ENCOUNTER — Other Ambulatory Visit: Payer: Self-pay | Admitting: *Deleted

## 2012-12-24 MED ORDER — ESTRADIOL 1 MG PO TABS: ORAL_TABLET | ORAL | Status: DC

## 2013-02-04 ENCOUNTER — Ambulatory Visit (INDEPENDENT_AMBULATORY_CARE_PROVIDER_SITE_OTHER): Payer: BC Managed Care – PPO | Admitting: Family Medicine

## 2013-02-04 ENCOUNTER — Encounter: Payer: Self-pay | Admitting: Family Medicine

## 2013-02-04 VITALS — BP 120/80 | HR 72 | Temp 97.7°F | Ht 66.0 in | Wt 163.0 lb

## 2013-02-04 DIAGNOSIS — Z8669 Personal history of other diseases of the nervous system and sense organs: Secondary | ICD-10-CM

## 2013-02-04 DIAGNOSIS — Z780 Asymptomatic menopausal state: Secondary | ICD-10-CM

## 2013-02-04 DIAGNOSIS — K219 Gastro-esophageal reflux disease without esophagitis: Secondary | ICD-10-CM

## 2013-02-04 MED ORDER — IMIPRAMINE HCL 50 MG PO TABS: 50.0000 mg | ORAL_TABLET | Freq: Every day | ORAL | Status: DC

## 2013-02-04 MED ORDER — ESTRADIOL 1 MG PO TABS: ORAL_TABLET | ORAL | Status: DC

## 2013-02-04 NOTE — Patient Instructions (Addendum)
Set up mammogram and consider complete physical this year.

## 2013-02-04 NOTE — Progress Notes (Signed)
  Subjective:    Patient ID: Kelly Gilbert, female    DOB: 1961-01-18, 52 y.o.   MRN: [id number]  HPI Patient here for med check. She has history of migraine headaches and takes imipramine for prophylaxis She had hysterectomy for benign disease 1999. She has been on estrogen replacement since that time. She's tried tapering off several times but has had severe hot flushes. She is overdue for mammogram and plans to schedule physical soon.  She is still smoking about half a pack cigarettes per day. Low motivation to quit. No consistent exercise.  Migraines are very infrequent. She has another type of headache which she attributes to sinuses and occurs almost daily. Usually relieved with decongestants.  Past Medical History  Diagnosis Date  . DEPRESSION 09/23/2009  . Migraine headache   . Lesion of bladder   . PONV (postoperative nausea and vomiting)    Past Surgical History  Procedure Laterality Date  . Benign left breaset bx  1992  . Vaginal hysterectomy  1999    EPIDURAL ANES.  Marland Kitchen Cystoscopy with biopsy  01/16/2012    Procedure: CYSTOSCOPY WITH BIOPSY;  Surgeon: Milford Cage, MD;  Location: [facility];  Service: Urology;  Laterality: N/A;  1 hour requested for this case  [facility]      reports that she has been smoking Cigarettes.  She has a 8.75 pack-year smoking history. She does not have any smokeless tobacco history on file. She reports that she drinks about 2.4 ounces of alcohol per week. Her drug history is not on file. family history includes Cancer in her father; Cancer (age of onset: 50) in her mother; and Hypertension in her father and maternal grandmother. Allergies  Allergen Reactions  . Codeine Sulfate Nausea And Vomiting  . Sulfa Antibiotics Hives and Swelling      Review of Systems  Constitutional: Negative for appetite change, fatigue and unexpected weight change.  Eyes: Negative for visual disturbance.  Respiratory: Negative  for cough, chest tightness, shortness of breath and wheezing.   Cardiovascular: Negative for chest pain, palpitations and leg swelling.  Neurological: Negative for dizziness, seizures, syncope, weakness, light-headedness and headaches.       Objective:   Physical Exam  Constitutional: She appears well-developed and well-nourished.  HENT:  Mouth/Throat: Oropharynx is clear and moist.  Neck: Neck supple. No thyromegaly present.  Cardiovascular: Normal rate and regular rhythm.   No murmur heard. Pulmonary/Chest: Effort normal and breath sounds normal. No respiratory distress. She has no wheezes. She has no rales.  Musculoskeletal: She exhibits no edema.          Assessment & Plan:  #1 history of migraine headaches. Very infrequent. Refill imipramine #2 history of GERD stable -currently not on any regular medication #3 postmenopausal. Refill Estrace. She'll consider tapering in near future. We strongly advocating discontinuing smoking. She'll schedule complete physical. She needs repeat mammogram and also needs colonoscopy

## 2013-03-12 ENCOUNTER — Other Ambulatory Visit (INDEPENDENT_AMBULATORY_CARE_PROVIDER_SITE_OTHER): Payer: BC Managed Care – PPO

## 2013-03-12 DIAGNOSIS — R7989 Other specified abnormal findings of blood chemistry: Secondary | ICD-10-CM

## 2013-03-12 DIAGNOSIS — Z0000 Encounter for general adult medical examination without abnormal findings: Secondary | ICD-10-CM

## 2013-03-12 LAB — CBC WITH DIFFERENTIAL/PLATELET
Basophils Absolute: 0 10*3/uL (ref 0.0–0.1)
Basophils Relative: 0.4 % (ref 0.0–3.0)
Eosinophils Absolute: 0.2 10*3/uL (ref 0.0–0.7)
Eosinophils Relative: 3.5 % (ref 0.0–5.0)
HCT: 37 % (ref 36.0–46.0)
Hemoglobin: 12.4 g/dL (ref 12.0–15.0)
Lymphocytes Relative: 33.2 % (ref 12.0–46.0)
Lymphs Abs: 1.8 10*3/uL (ref 0.7–4.0)
MCHC: 33.4 g/dL (ref 30.0–36.0)
MCV: 89.3 fl (ref 78.0–100.0)
Monocytes Absolute: 0.4 10*3/uL (ref 0.1–1.0)
Monocytes Relative: 7.9 % (ref 3.0–12.0)
Neutro Abs: 3 10*3/uL (ref 1.4–7.7)
Neutrophils Relative %: 55 % (ref 43.0–77.0)
Platelets: 298 10*3/uL (ref 150.0–400.0)
RBC: 4.14 Mil/uL (ref 3.87–5.11)
RDW: 14.1 % (ref 11.5–14.6)
WBC: 5.4 10*3/uL (ref 4.5–10.5)

## 2013-03-12 LAB — HEPATIC FUNCTION PANEL
ALT: 13 U/L (ref 0–35)
AST: 20 U/L (ref 0–37)
Albumin: 4.2 g/dL (ref 3.5–5.2)
Alkaline Phosphatase: 50 U/L (ref 39–117)
Bilirubin, Direct: 0.1 mg/dL (ref 0.0–0.3)
Total Bilirubin: 0.6 mg/dL (ref 0.3–1.2)
Total Protein: 6.8 g/dL (ref 6.0–8.3)

## 2013-03-12 LAB — POCT URINALYSIS DIPSTICK
Protein, UA: NEGATIVE
Spec Grav, UA: >=1.03
Urobilinogen, UA: 0.2

## 2013-03-12 LAB — BASIC METABOLIC PANEL
CO2: 26 mEq/L (ref 19–32)
Calcium: 9.5 mg/dL (ref 8.4–10.5)
Sodium: 145 mEq/L (ref 135–145)

## 2013-03-12 LAB — LIPID PANEL
Total CHOL/HDL Ratio: 3
VLDL: 11.4 mg/dL (ref 0.0–40.0)

## 2013-03-12 LAB — TSH: TSH: 0.77 u[IU]/mL (ref 0.35–5.50)

## 2013-03-13 LAB — LDL CHOLESTEROL, DIRECT: Direct LDL: 146.3 mg/dL

## 2013-03-18 ENCOUNTER — Encounter: Payer: Self-pay | Admitting: Family Medicine

## 2013-03-18 ENCOUNTER — Ambulatory Visit (INDEPENDENT_AMBULATORY_CARE_PROVIDER_SITE_OTHER): Payer: BC Managed Care – PPO | Admitting: Family Medicine

## 2013-03-18 VITALS — BP 128/80 | HR 74 | Temp 97.7°F | Ht 66.0 in | Wt 162.0 lb

## 2013-03-18 DIAGNOSIS — R319 Hematuria, unspecified: Secondary | ICD-10-CM | POA: Insufficient documentation

## 2013-03-18 DIAGNOSIS — F988 Other specified behavioral and emotional disorders with onset usually occurring in childhood and adolescence: Secondary | ICD-10-CM | POA: Insufficient documentation

## 2013-03-18 DIAGNOSIS — Z0000 Encounter for general adult medical examination without abnormal findings: Secondary | ICD-10-CM

## 2013-03-18 MED ORDER — AMPHETAMINE-DEXTROAMPHETAMINE 10 MG PO TABS: 10.0000 mg | ORAL_TABLET | Freq: Two times a day (BID) | ORAL | Status: DC

## 2013-03-18 NOTE — Progress Notes (Signed)
Subjective:    Patient ID: Kelly Gilbert, female    DOB: May 02, 1961, 52 y.o.   MRN: [id number]  HPI Patient here for complete physical She has history of GERD and migraine headaches. Headaches are stable. Does have history of mild depression currently stable. Ongoing nicotine use less than one half pack cigarettes per day. She's had previous hysterectomy for benign disease. No mammogram a couple of years. Just turned 52 and no history of screening colonoscopy  She has concern for possible attention deficit disorder. Both of her daughters have been diagnosed. Patient has a friend who is a Warden/ranger who recently administered Manson Passey ADD scale and this was highly suggestive of her having attention deficit disorder. Patient states she's had these traits since early childhood. She struggled through school at times. She has problems with disorganization mostly and sometimes completing tasks. Easy distractibility. This has caused great distress at times at work. Other coworkers have noted as well. She has noticed easy distraction in multiple environments  Past Medical History  Diagnosis Date  . DEPRESSION 09/23/2009  . Migraine headache   . Lesion of bladder   . PONV (postoperative nausea and vomiting)    Past Surgical History  Procedure Laterality Date  . Benign left breaset bx  1992  . Vaginal hysterectomy  1999    EPIDURAL ANES.  Marland Kitchen Cystoscopy with biopsy  01/16/2012    Procedure: CYSTOSCOPY WITH BIOPSY;  Surgeon: Milford Cage, MD;  Location: [facility];  Service: Urology;  Laterality: N/A;  1 hour requested for this case  C[facility]      reports that she has been smoking Cigarettes.  She has a 8.75 pack-year smoking history. She does not have any smokeless tobacco history on file. She reports that she drinks about 2.4 ounces of alcohol per week. Her drug history is not on file. family history includes Cancer in her father; Cancer (age of onset: 47) in her  mother; and Hypertension in her father and maternal grandmother. Allergies  Allergen Reactions  . Codeine Sulfate Nausea And Vomiting  . Sulfa Antibiotics Hives and Swelling      Review of Systems  Constitutional: Negative for fever, activity change, appetite change, fatigue and unexpected weight change.  HENT: Negative for hearing loss, ear pain, sore throat and trouble swallowing.   Eyes: Negative for visual disturbance.  Respiratory: Negative for cough and shortness of breath.   Cardiovascular: Negative for chest pain and palpitations.  Gastrointestinal: Negative for abdominal pain, diarrhea, constipation and blood in stool.  Endocrine: Negative for polydipsia and polyuria.  Genitourinary: Negative for dysuria and hematuria.  Musculoskeletal: Negative for myalgias, back pain and arthralgias.  Skin: Negative for rash.  Neurological: Negative for dizziness, syncope and headaches.  Hematological: Negative for adenopathy.  Psychiatric/Behavioral: Positive for decreased concentration. Negative for confusion, dysphoric mood and agitation.       Objective:   Physical Exam  Constitutional: She is oriented to person, place, and time. She appears well-developed and well-nourished.  HENT:  Head: Normocephalic and atraumatic.  Eyes: EOM are normal. Pupils are equal, round, and reactive to light.  Neck: Normal range of motion. Neck supple. No thyromegaly present.  Cardiovascular: Normal rate, regular rhythm and normal heart sounds.   No murmur heard. Pulmonary/Chest: Breath sounds normal. No respiratory distress. She has no wheezes. She has no rales.  Abdominal: Soft. Bowel sounds are normal. She exhibits no distension and no mass. There is no tenderness. There is no rebound and no guarding.  Genitourinary:  Breasts are symmetric with no mass. Pelvic is deferred as she has had hysterectomy for benign disease  Musculoskeletal: Normal range of motion. She exhibits no edema.   Lymphadenopathy:    She has no cervical adenopathy.  Neurological: She is alert and oriented to person, place, and time. She displays normal reflexes. No cranial nerve deficit.  Skin: No rash noted.  Psychiatric: She has a normal mood and affect. Her behavior is normal. Judgment and thought content normal.           Assessment & Plan:  #1 health maintenance. She we'll schedule mammography. Referral for colonoscopy. Smoking cessation discussed and motivation is fairly low. Labs reviewed with patient #2 probable attention deficit disorder. Recent evaluation as above highly suggestive. Trial Adderall 10 mg twice a day as needed. Return in one to 2 months to reassess #3 history of hematuria. Previous workup per urology. She again has 2+ blood on dipstick today. No recent gross hematuria. Repeat urine dipstick at followup and send urine micro at that time if still positive

## 2013-03-18 NOTE — Patient Instructions (Addendum)
Schedule mammogram. We will call you regarding screening colonoscopy  Smoking Cessation Quitting smoking is important to your health and has many advantages. However, it is not always easy to quit since nicotine is a very addictive drug. Often times, people try 3 times or more before being able to quit. This document explains the best ways for you to prepare to quit smoking. Quitting takes hard work and a lot of effort, but you can do it. ADVANTAGES OF QUITTING SMOKING  You will live longer, feel better, and live better.  Your body will feel the impact of quitting smoking almost immediately.  Within 20 minutes, blood pressure decreases. Your pulse returns to its normal level.  After 8 hours, carbon monoxide levels in the blood return to normal. Your oxygen level increases.  After 24 hours, the chance of having a heart attack starts to decrease. Your breath, hair, and body stop smelling like smoke.  After 48 hours, damaged nerve endings begin to recover. Your sense of taste and smell improve.  After 72 hours, the body is virtually free of nicotine. Your bronchial tubes relax and breathing becomes easier.  After 2 to 12 weeks, lungs can hold more air. Exercise becomes easier and circulation improves.  The risk of having a heart attack, stroke, cancer, or lung disease is greatly reduced.  After 1 year, the risk of coronary heart disease is cut in half.  After 5 years, the risk of stroke falls to the same as a nonsmoker.  After 10 years, the risk of lung cancer is cut in half and the risk of other cancers decreases significantly.  After 15 years, the risk of coronary heart disease drops, usually to the level of a nonsmoker.  If you are pregnant, quitting smoking will improve your chances of having a healthy baby.  The people you live with, especially any children, will be healthier.  You will have extra money to spend on things other than cigarettes. QUESTIONS TO THINK ABOUT BEFORE  ATTEMPTING TO QUIT You may want to talk about your answers with your caregiver.  Why do you want to quit?  If you tried to quit in the past, what helped and what did not?  What will be the most difficult situations for you after you quit? How will you plan to handle them?  Who can help you through the tough times? Your family? Friends? A caregiver?  What pleasures do you get from smoking? What ways can you still get pleasure if you quit? Here are some questions to ask your caregiver:  How can you help me to be successful at quitting?  What medicine do you think would be best for me and how should I take it?  What should I do if I need more help?  What is smoking withdrawal like? How can I get information on withdrawal? GET READY  Set a quit date.  Change your environment by getting rid of all cigarettes, ashtrays, matches, and lighters in your home, car, or work. Do not let people smoke in your home.  Review your past attempts to quit. Think about what worked and what did not. GET SUPPORT AND ENCOURAGEMENT You have a better chance of being successful if you have help. You can get support in many ways.  Tell your family, friends, and co-workers that you are going to quit and need their support. Ask them not to smoke around you.  Get individual, group, or telephone counseling and support. Programs are available at local  hospitals and health centers. Call your local health department for information about programs in your area.  Spiritual beliefs and practices may help some smokers quit.  Download a "quit meter" on your computer to keep track of quit statistics, such as how long you have gone without smoking, cigarettes not smoked, and money saved.  Get a self-help book about quitting smoking and staying off of tobacco. [place] yourself from urges to smoke. Talk to someone, go for a walk, or occupy your time with a task.  Change your normal  routine. Take a different route to work. Drink tea instead of coffee. Eat breakfast in a different place.  Reduce your stress. Take a hot bath, exercise, or read a book.  Plan something enjoyable to do every day. Reward yourself for not smoking.  Explore interactive web-based programs that specialize in helping you quit. GET MEDICINE AND USE IT CORRECTLY Medicines can help you stop smoking and decrease the urge to smoke. Combining medicine with the above behavioral methods and support can greatly increase your chances of successfully quitting smoking.  Nicotine replacement therapy helps deliver nicotine to your body without the negative effects and risks of smoking. Nicotine replacement therapy includes nicotine gum, lozenges, inhalers, nasal sprays, and skin patches. Some may be available over-the-counter and others require a prescription.  Antidepressant medicine helps people abstain from smoking, but how this works is unknown. This medicine is available by prescription.  Nicotinic receptor partial agonist medicine simulates the effect of nicotine in your brain. This medicine is available by prescription. Ask your caregiver for advice about which medicines to use and how to use them based on your health history. Your caregiver will tell you what side effects to look out for if you choose to be on a medicine or therapy. Carefully read the information on the package. Do not use any other product containing nicotine while using a nicotine replacement product.  RELAPSE OR DIFFICULT SITUATIONS Most relapses occur within the first 3 months after quitting. Do not be discouraged if you start smoking again. Remember, most people try several times before finally quitting. You may have symptoms of withdrawal because your body is used to nicotine. You may crave cigarettes, be irritable, feel very hungry, cough often, get headaches, or have difficulty concentrating. The withdrawal symptoms are only temporary.  They are strongest when you first quit, but they will go away within 10 14 days. To reduce the chances of relapse, try to:  Avoid drinking alcohol. Drinking lowers your chances of successfully quitting.  Reduce the amount of caffeine you consume. Once you quit smoking, the amount of caffeine in your body increases and can give you symptoms, such as a rapid heartbeat, sweating, and anxiety.  Avoid smokers because they can make you want to smoke.  Do not let weight gain distract you. Many smokers will gain weight when they quit, usually less than 10 pounds. Eat a healthy diet and stay active. You can always lose the weight gained after you quit.  Find ways to improve your mood other than smoking. FOR MORE INFORMATION  www.smokefree.gov  Document Released: 07/19/2001 Document Revised: 01/24/2012 Document Reviewed: 11/03/2011 [facility] Patient Information 2014 [place], [place].

## 2013-03-27 ENCOUNTER — Telehealth: Payer: Self-pay | Admitting: Family Medicine

## 2013-03-27 ENCOUNTER — Encounter: Payer: Self-pay | Admitting: Family Medicine

## 2013-03-27 ENCOUNTER — Encounter: Payer: Self-pay | Admitting: Internal Medicine

## 2013-03-27 ENCOUNTER — Ambulatory Visit (INDEPENDENT_AMBULATORY_CARE_PROVIDER_SITE_OTHER): Payer: BC Managed Care – PPO | Admitting: Family Medicine

## 2013-03-27 VITALS — BP 126/74 | HR 75 | Temp 97.9°F | Wt 161.0 lb

## 2013-03-27 DIAGNOSIS — S335XXA Sprain of ligaments of lumbar spine, initial encounter: Secondary | ICD-10-CM

## 2013-03-27 DIAGNOSIS — M549 Dorsalgia, unspecified: Secondary | ICD-10-CM

## 2013-03-27 DIAGNOSIS — S39012A Strain of muscle, fascia and tendon of lower back, initial encounter: Secondary | ICD-10-CM

## 2013-03-27 LAB — POCT URINALYSIS DIPSTICK
Glucose, UA: NEGATIVE
Nitrite, UA: NEGATIVE
Protein, UA: NEGATIVE
Urobilinogen, UA: 0.2

## 2013-03-27 MED ORDER — CYCLOBENZAPRINE HCL 10 MG PO TABS: 10.0000 mg | ORAL_TABLET | Freq: Three times a day (TID) | ORAL | Status: DC | PRN

## 2013-03-27 NOTE — Telephone Encounter (Signed)
Patient Information:  Caller Name: Kelly Gilbert  Phone: [phone]  Patient: Kelly Gilbert, Kelly Gilbert  Gender: Female  DOB: 1960/10/29  Age: 52 Years  PCP: Evelena Peat [facility])  Pregnant: No  Office Follow Up:  Does the office need to follow up with this patient?: No  Instructions For The Office: N/A  RN Note:  Hysterectomy.  Constant low back pain since 03/23/13 that now is more in right flank area. Current pain rated 6-7/10. Took 800 mg Ibuprofen at 1330. Spasms of pain at times when sitting still.  Occasionally pain radiates into groin, but not present now.  Hematuria noted  at 03/12/13 visit. Urine appears dark yellow. Instructed to decrease Ibuprofen to 600 mg every  8-12 hours unless MD orders it differently.  Symptoms  Reason For Call & Symptoms: History of hematuria at last office visit 03/18/13; called concerning new onset of  low back pain that now is right flank pain.  Reviewed Health History In EMR: Yes  Reviewed Medications In EMR: Yes  Reviewed Allergies In EMR: Yes  Reviewed Surgeries / Procedures: Yes  Date of Onset of Symptoms: 03/23/2013  Treatments Tried: 800 mg Ibuprofen q 6 hours, ice pack, otc topical pain relief  Treatments Tried Worked: No OB / GYN:  LMP: Unknown  Guideline(s) Used:  Flank Pain  Disposition Per Guideline:   See Today in Office  Reason For Disposition Reached:   Moderate pain (e.g., interferes with normal activities or awakens from sleep)  Advice Given:  Cold or Heat:  Heat Pack: If pain lasts over 2 days, apply heat to the sore area. Use a heat pack, heating pad, or warm wet washcloth. Do this for 10 minutes, then as needed.  Pain Medicines:  Ibuprofen (e.g., Motrin, Advil):  Another choice is to take 600 mg (three 200 mg pills) by mouth every 8 hours.  The most you should take each day is 1,200 mg (six 200 mg pills), unless your doctor has told you to take more.  Call Back If:  Fever over 100.5 F (38.1 C)  Burning with urination  or blood in urine  You become worse.  Patient Will Follow Care Advice:  YES  Appointment Scheduled:  03/27/2013 15:45:00 Appointment Scheduled Provider:  Evelena Peat [facility])

## 2013-03-27 NOTE — Progress Notes (Signed)
  Subjective:    Patient ID: Kelly Gilbert, female    DOB: [birth year], 52 y.o.   MRN: [id number]  HPI Acute visit for low back pain. Onset of this past Saturday while shopping. No specific injury. Initially was midline back and now more right lumbar area. She describes sharp pain which occasionally radiates toward right anterior hip. Worse with movement such as bending or twisting. 6/10 severity. She's taking ibuprofen up to 800 mg per dose with minimal improvement. No urinary symptoms. No fevers or chills. No radiculopathy symptoms. No numbness or weakness. No history of back difficulties. She has had some sleep difficulties secondary to pain  She has history of chronic microscopic hematuria and has had full urologic workup in the past. No recent gross hematuria. No dysuria.  Past Medical History  Diagnosis Date  . DEPRESSION 09/23/2009  . Migraine headache   . Lesion of bladder   . PONV (postoperative nausea and vomiting)    Past Surgical History  Procedure Laterality Date  . Benign left breaset bx  1992  . Vaginal hysterectomy  1999    EPIDURAL ANES.  Marland Kitchen Cystoscopy with biopsy  01/16/2012    Procedure: CYSTOSCOPY WITH BIOPSY;  Surgeon: Milford Cage, MD;  Location: [facility];  Service: Urology;  Laterality: N/A;  1 hour requested for this case  [facility]      reports that she has been smoking Cigarettes.  She has a 8.75 pack-year smoking history. She does not have any smokeless tobacco history on file. She reports that she drinks about 2.4 ounces of alcohol per week. Her drug history is not on file. family history includes Cancer in her father; Cancer (age of onset: 71) in her mother; Hypertension in her father and maternal grandmother. Allergies  Allergen Reactions  . Codeine Sulfate Nausea And Vomiting  . Sulfa Antibiotics Hives and Swelling      Review of Systems  Constitutional: Negative for fever, chills, appetite change and unexpected  weight change.  Gastrointestinal: Negative for abdominal pain.  Genitourinary: Negative for dysuria.  Musculoskeletal: Positive for back pain.  Neurological: Negative for weakness and numbness.       Objective:   Physical Exam  Constitutional: She appears well-developed and well-nourished.  Cardiovascular: Normal rate and regular rhythm.   Pulmonary/Chest: Effort normal and breath sounds normal. No respiratory distress. She has no wheezes. She has no rales.  Musculoskeletal:  No peripheral edema. Straight leg raise is negative. She has some minimal tenderness right lower lumbar region.  Neurological:  Full-strength lower extremities with symmetric reflexes.          Assessment & Plan:  Acute right lumbar back strain. Continue ibuprofen. Stretches reviewed. Flexeril 10 mg each bedtime. Consider physical therapy if not improving next couple weeks

## 2013-03-27 NOTE — Patient Instructions (Addendum)

## 2013-04-15 ENCOUNTER — Telehealth: Payer: Self-pay | Admitting: Family Medicine

## 2013-04-15 DIAGNOSIS — M545 Low back pain: Secondary | ICD-10-CM

## 2013-04-15 NOTE — Telephone Encounter (Signed)
Pt called and stated that she is still experiencing pain in her legs. She would like a referral to see a physical therapist, because this is what the chiropractor recommended. Please assist.

## 2013-04-15 NOTE — Telephone Encounter (Signed)
Let pt know I have made referral 

## 2013-04-16 NOTE — Telephone Encounter (Signed)
Pt informed

## 2013-04-18 ENCOUNTER — Ambulatory Visit (INDEPENDENT_AMBULATORY_CARE_PROVIDER_SITE_OTHER): Payer: BC Managed Care – PPO | Admitting: Family Medicine

## 2013-04-18 ENCOUNTER — Encounter: Payer: Self-pay | Admitting: Family Medicine

## 2013-04-18 VITALS — BP 128/82 | HR 83 | Temp 97.5°F | Ht 66.0 in | Wt 159.0 lb

## 2013-04-18 DIAGNOSIS — F988 Other specified behavioral and emotional disorders with onset usually occurring in childhood and adolescence: Secondary | ICD-10-CM

## 2013-04-18 DIAGNOSIS — Z23 Encounter for immunization: Secondary | ICD-10-CM

## 2013-04-18 DIAGNOSIS — Z5189 Encounter for other specified aftercare: Secondary | ICD-10-CM

## 2013-04-18 DIAGNOSIS — S39012D Strain of muscle, fascia and tendon of lower back, subsequent encounter: Secondary | ICD-10-CM

## 2013-04-18 MED ORDER — AMPHETAMINE-DEXTROAMPHETAMINE 15 MG PO TABS: 15.0000 mg | ORAL_TABLET | Freq: Two times a day (BID) | ORAL | Status: DC

## 2013-04-18 NOTE — Progress Notes (Signed)
  Subjective:    Patient ID: Kelly Gilbert, female    DOB: [birth year], 52 y.o.   MRN: [id number]  HPI Patient seen regarding followup attention deficit disorder Started Adderall 10 mg twice a day and she is seeing great improvement in terms of her ability to focus. She's not any headaches. No major appetite suppression. She seemed to be losing some effect over the past few days and titrated this up to 15 mg on her own which seemed to help. No side effects.  Recent low back pain unchanged. She went to chiropractor had a few treatments which did not seem to help. They suggested physical therapy and she is in process of getting set up for this.  Past Medical History  Diagnosis Date  . DEPRESSION 09/23/2009  . Migraine headache   . Lesion of bladder   . PONV (postoperative nausea and vomiting)    Past Surgical History  Procedure Laterality Date  . Benign left breaset bx  1992  . Vaginal hysterectomy  1999    EPIDURAL ANES.  Marland Kitchen Cystoscopy with biopsy  01/16/2012    Procedure: CYSTOSCOPY WITH BIOPSY;  Surgeon: Milford Cage, MD;  Location: [facility];  Service: Urology;  Laterality: N/A;  1 hour requested for this case  [facility]      reports that she has been smoking Cigarettes.  She has a 8.75 pack-year smoking history. She does not have any smokeless tobacco history on file. She reports that she drinks about 2.4 ounces of alcohol per week. Her drug history is not on file. family history includes Cancer in her father; Cancer (age of onset: 39) in her mother; Hypertension in her father and maternal grandmother. Allergies  Allergen Reactions  . Codeine Sulfate Nausea And Vomiting  . Sulfa Antibiotics Hives and Swelling     Review of Systems  Constitutional: Negative for fatigue.  Eyes: Negative for visual disturbance.  Respiratory: Negative for cough, chest tightness, shortness of breath and wheezing.   Cardiovascular: Negative for chest pain,  palpitations and leg swelling.  Neurological: Negative for dizziness, seizures, syncope, weakness, light-headedness and headaches.       Objective:   Physical Exam  Constitutional: She appears well-developed and well-nourished.  Cardiovascular: Normal rate and regular rhythm.   Pulmonary/Chest: Effort normal and breath sounds normal. No respiratory distress. She has no wheezes. She has no rales.  Musculoskeletal: She exhibits no edema.          Assessment & Plan:  Attention deficit disorder. Improved. Titrate Adderall to 15 mg twice daily as needed. Prescription written

## 2013-06-03 ENCOUNTER — Other Ambulatory Visit: Payer: Self-pay | Admitting: Family Medicine

## 2013-06-04 ENCOUNTER — Other Ambulatory Visit: Payer: Self-pay

## 2013-06-04 MED ORDER — AMPHETAMINE-DEXTROAMPHETAMINE 15 MG PO TABS: 15.0000 mg | ORAL_TABLET | Freq: Two times a day (BID) | ORAL | Status: DC

## 2013-06-10 ENCOUNTER — Ambulatory Visit (AMBULATORY_SURGERY_CENTER): Payer: BC Managed Care – PPO

## 2013-06-10 VITALS — Ht 66.5 in | Wt 154.0 lb

## 2013-06-10 DIAGNOSIS — Z1211 Encounter for screening for malignant neoplasm of colon: Secondary | ICD-10-CM

## 2013-06-10 MED ORDER — MOVIPREP 100 G PO SOLR: 1.0000 | Freq: Once | ORAL | Status: DC

## 2013-06-13 ENCOUNTER — Encounter: Payer: Self-pay | Admitting: Internal Medicine

## 2013-06-20 ENCOUNTER — Telehealth: Payer: Self-pay | Admitting: *Deleted

## 2013-06-20 NOTE — Telephone Encounter (Signed)
Patients daughter was just informed that she will be inducted into the honor society 06/24/13. Pt scheduled for procedure that day and she is worried about not being able to attend. Reassured patient that she would be able to attend induction if she had a driver to take her but she was still uneasy. Offered to reschedule and patient accepted, rescheduled to 06/27/13 1100. Reviewed prep instructions and times with patient stating full understanding.

## 2013-06-24 ENCOUNTER — Encounter: Payer: BC Managed Care – PPO | Admitting: Internal Medicine

## 2013-06-27 ENCOUNTER — Ambulatory Visit (AMBULATORY_SURGERY_CENTER): Payer: BC Managed Care – PPO | Admitting: Internal Medicine

## 2013-06-27 ENCOUNTER — Encounter: Payer: Self-pay | Admitting: Internal Medicine

## 2013-06-27 VITALS — BP 137/82 | HR 68 | Temp 96.6°F | Resp 17 | Ht 66.5 in | Wt 154.0 lb

## 2013-06-27 DIAGNOSIS — K635 Polyp of colon: Secondary | ICD-10-CM

## 2013-06-27 DIAGNOSIS — Z1211 Encounter for screening for malignant neoplasm of colon: Secondary | ICD-10-CM

## 2013-06-27 DIAGNOSIS — D126 Benign neoplasm of colon, unspecified: Secondary | ICD-10-CM

## 2013-06-27 MED ORDER — SODIUM CHLORIDE 0.9 % IV SOLN: 500.0000 mL | INTRAVENOUS | Status: DC

## 2013-06-27 NOTE — Progress Notes (Signed)
Called to room to assist during endoscopic procedure.  Patient ID and intended procedure confirmed with present staff. Received instructions for my participation in the procedure from the performing physician.  

## 2013-06-27 NOTE — Progress Notes (Signed)
Patient did not experience any of the following events: a burn prior to discharge; a fall within the facility; wrong site/side/patient/procedure/implant event; or a hospital transfer or hospital admission upon discharge from the facility. (G8907) Patient did not have preoperative order for IV antibiotic SSI prophylaxis. (G8918)  

## 2013-06-27 NOTE — Progress Notes (Signed)
A/ox3 pleased with MAC, report to Robbin RN 

## 2013-06-27 NOTE — Patient Instructions (Signed)
Colon polyps removed today, see handout. Hold aspirin,aspirin products, and anti-inflammatory medications for 2 weeks. Call us with any questions or concerns. Thank you!!  YOU HAD AN ENDOSCOPIC PROCEDURE TODAY AT THE [facility]: Refer to the procedure report that was given to you for any specific questions about what was found during the examination.  If the procedure report does not answer your questions, please call your gastroenterologist to clarify.  If you requested that your care partner not be given the details of your procedure findings, then the procedure report has been included in a sealed envelope for you to review at your convenience later.  YOU SHOULD EXPECT: Some feelings of bloating in the abdomen. Passage of more gas than usual.  Walking can help get rid of the air that was put into your GI tract during the procedure and reduce the bloating. If you had a lower endoscopy (such as a colonoscopy or flexible sigmoidoscopy) you may notice spotting of blood in your stool or on the toilet paper. If you underwent a bowel prep for your procedure, then you may not have a normal bowel movement for a few days.  DIET: Your first meal following the procedure should be a light meal and then it is ok to progress to your normal diet.  A half-sandwich or bowl of soup is an example of a good first meal.  Heavy or fried foods are harder to digest and may make you feel nauseous or bloated.  Likewise meals heavy in dairy and vegetables can cause extra gas to form and this can also increase the bloating.  Drink plenty of fluids but you should avoid alcoholic beverages for 24 hours.  ACTIVITY: Your care partner should take you home directly after the procedure.  You should plan to take it easy, moving slowly for the rest of the day.  You can resume normal activity the day after the procedure however you should NOT DRIVE or use heavy machinery for 24 hours (because of the sedation medicines used  during the test).    SYMPTOMS TO REPORT IMMEDIATELY: A gastroenterologist can be reached at any hour.  During normal business hours, 8:30 AM to 5:00 PM Monday through Friday, call [phone].  After hours and on weekends, please call the GI answering service at [phone] who will take a message and have the physician on call contact you.   Following lower endoscopy (colonoscopy or flexible sigmoidoscopy):  Excessive amounts of blood in the stool  Significant tenderness or worsening of abdominal pains  Swelling of the abdomen that is new, acute  Fever of 100F or higher  Following upper endoscopy (EGD)  Vomiting of blood or coffee ground material  New chest pain or pain under the shoulder blades  Painful or persistently difficult swallowing  New shortness of breath  Fever of 100F or higher  Black, tarry-looking stools  FOLLOW UP: If any biopsies were taken you will be contacted by phone or by letter within the next 1-3 weeks.  Call your gastroenterologist if you have not heard about the biopsies in 3 weeks.  Our staff will call the home number listed on your records the next business day following your procedure to check on you and address any questions or concerns that you may have at that time regarding the information given to you following your procedure. This is a courtesy call and so if there is no answer at the home number and we have not heard from you through  the emergency physician on call, we will assume that you have returned to your regular daily activities without incident.  SIGNATURES/CONFIDENTIALITY: You and/or your care partner have signed paperwork which will be entered into your electronic medical record.  These signatures attest to the fact that that the information above on your After Visit Summary has been reviewed and is understood.  Full responsibility of the confidentiality of this discharge information lies with you and/or your care-partner.

## 2013-06-27 NOTE — Op Note (Signed)
[facility] [address] [place], [address]   COLONOSCOPY PROCEDURE REPORT  PATIENT: Kelly Gilbert, Kelly Gilbert  MR#: [id number] BIRTHDATE: 1961-04-28 , 52  yrs. old GENDER: Female ENDOSCOPIST: Beverley Fiedler, MD REFERRED TK:ZSWFU Caryl Never, M.D. PROCEDURE DATE:  06/27/2013 PROCEDURE:   Colonoscopy with snare polypectomy First Screening Colonoscopy - Avg.  risk and is 50 yrs.  old or older Yes.  Prior Negative Screening - Now for repeat screening. N/A  History of Adenoma - Now for follow-up colonoscopy & has been > or = to 3 yrs.  N/A  Polyps Removed Today? Yes. ASA CLASS:   Class II INDICATIONS:average risk screening and first colonoscopy. MEDICATIONS: MAC sedation, administered by CRNA and propofol (Diprivan) 300mg  IV  DESCRIPTION OF PROCEDURE:   After the risks benefits and alternatives of the procedure were thoroughly explained, informed consent was obtained.  A digital rectal exam revealed no rectal mass.   The LB [id number]  endoscope was introduced through the anus and advanced to the terminal ileum which was intubated for a short distance. No adverse events experienced.   The quality of the prep was good, using MoviPrep  The instrument was then slowly withdrawn as the colon was fully examined.   COLON FINDINGS: The mucosa appeared normal in the terminal ileum. Three sessile polyps ranging between 3-53mm in size were found at the splenic flexure (1) and in the descending colon (2). Polypectomy was performed using cold snare.  All resections were complete and all polyp tissue was completely retrieved.   A sessile polyp measuring 7 mm in size was found in the descending colon.  A polypectomy was performed using snare cautery.  The resection was complete and the polyp tissue was completely retrieved. Retroflexed views revealed internal hemorrhoids. The time to cecum=3 minutes 30 seconds.  Withdrawal time=16 minutes 01 seconds. The scope was withdrawn and the  procedure completed.  COMPLICATIONS: There were no complications.    ENDOSCOPIC IMPRESSION: 1.   Normal mucosa in the terminal ileum 2.   Three sessile polyps ranging between 3-43mm in size were found at the splenic flexure and in the descending colon; Polypectomy was performed using cold snare 3.   Sessile polyp measuring 7 mm in size was found in the descending colon; polypectomy was performed using snare cautery  RECOMMENDATIONS: 1.  Await pathology results 2.  Hold aspirin, aspirin products, and anti-inflammatory medication for 2 weeks. 3.  Timing of repeat colonoscopy will be determined by pathology findings. 4.  You will receive a letter within 1-2 weeks with the results of your biopsy as well as final recommendations.  Please call my office if you have not received a letter after 3 weeks.   eSigned:  Beverley Fiedler, MD 06/27/2013 11:11 AM   cc: The Patient and Evelena Peat, MD   PATIENT NAME:  Kelly Gilbert, Kelly Gilbert MR#: [id number]

## 2013-06-28 ENCOUNTER — Telehealth: Payer: Self-pay

## 2013-06-28 NOTE — Telephone Encounter (Signed)
  Follow up Call-  Call back number 06/27/2013  Post procedure Call Back phone  # [phone]  Permission to leave phone message Yes     Patient questions:  Do you have a fever, pain , or abdominal swelling? no Pain Score  0 *  Have you tolerated food without any problems? yes  Have you been able to return to your normal activities? yes  Do you have any questions about your discharge instructions: Diet   no Medications  no Follow up visit  no  Do you have questions or concerns about your Care? no  Actions: * If pain score is 4 or above: No action needed, pain <4.

## 2013-07-01 ENCOUNTER — Telehealth: Payer: Self-pay | Admitting: Internal Medicine

## 2013-07-01 ENCOUNTER — Ambulatory Visit (INDEPENDENT_AMBULATORY_CARE_PROVIDER_SITE_OTHER): Payer: BC Managed Care – PPO | Admitting: Nurse Practitioner

## 2013-07-01 ENCOUNTER — Encounter: Payer: Self-pay | Admitting: Nurse Practitioner

## 2013-07-01 VITALS — BP 100/60 | HR 82 | Ht 66.5 in | Wt 152.0 lb

## 2013-07-01 DIAGNOSIS — R202 Paresthesia of skin: Secondary | ICD-10-CM

## 2013-07-01 DIAGNOSIS — R209 Unspecified disturbances of skin sensation: Secondary | ICD-10-CM

## 2013-07-01 NOTE — Telephone Encounter (Signed)
Patient states yesterday, her right hand was numb along her thumb where the IV site was during her procedure.. Stayed numb about 3 hours. Hand was slightly swollen also. It is better today. Scheduled OV with Willette Cluster, NP today at 3:30 PM to evaluate IV site.

## 2013-07-01 NOTE — Patient Instructions (Signed)
Call back if your symptoms get worse.

## 2013-07-02 ENCOUNTER — Encounter: Payer: Self-pay | Admitting: Nurse Practitioner

## 2013-07-02 DIAGNOSIS — R202 Paresthesia of skin: Secondary | ICD-10-CM | POA: Insufficient documentation

## 2013-07-02 NOTE — Progress Notes (Addendum)
     History of Present Illness:   Patient is a 52 year old female who underwent colonoscopy with polypectomy by Dr. Rhea Belton 06/27/13. She is worked in today for evaluation of swelling at IV site. Patient didn't notice any problems until fingers "fell asleep" yesterday. She then noticed swelling at previous IV site near right wrist. Swelling better today, numbness in fingers has resolved.   Current Medications, Allergies, Past Medical History, Past Surgical History, Family History and Social History were reviewed in [employer] record.  Physical Exam: General: Pleasant, well developed , white female in no acute distress Right Hand: just below base of thumb and extending into wrist there is brown discoloration of skin, possibly resolving bruise. No significant swelling of hand or wrist. Good ROM of thumb and wrist. All five left fingers with good sensation to light touch.     Assessment and Recommendations:  69.  52 year old female with swelling / numbness at previous  IV site on right hand ( ?cephalic vein). Today numbness has resolved and there is no obvious swelling. Exam doesn't suggest phlebitis or nerve damage. She has no pain in the area. No intervention needed unless she has recurrent symptoms.   2. colon polyps. Pathology still pending.    Addendum: Reviewed and agree with initial management. Beverley Fiedler, MD

## 2013-07-03 ENCOUNTER — Encounter: Payer: Self-pay | Admitting: Internal Medicine

## 2013-07-09 ENCOUNTER — Telehealth: Payer: Self-pay | Admitting: Internal Medicine

## 2013-07-10 NOTE — Telephone Encounter (Signed)
No new recommendations, she should call if she has any worsening pain near the IV site

## 2013-07-10 NOTE — Telephone Encounter (Signed)
Pt has been informed to call for worsening at IV site.

## 2013-07-10 NOTE — Telephone Encounter (Signed)
Informed pt of path results and read her the path letter which was mailed yesterday. She stated understanding with the letter and we discussed her IV site. She states her right hand, IV site hurts occasionally, but denies fever or redness at the site. Informed her I would inform Dr Rhea Belton and call her if he has any add'l suggestions. Any orders? Thanks.

## 2013-08-16 ENCOUNTER — Telehealth: Payer: Self-pay | Admitting: Family Medicine

## 2013-08-16 NOTE — Telephone Encounter (Signed)
Called to clarify with patient on the lost script. But pt was given script on 06/04/13 #60 0 refill

## 2013-08-16 NOTE — Telephone Encounter (Signed)
Patient is requesting another prescription. She believes she lost the one that she was given foramphetamine-dextroamphetamine (ADDERALL) 15 MG tablet. A quantity of 60 (2 per day), but she wants a 90 day supply. Please call her when it's ready. Thanks!!

## 2013-08-19 MED ORDER — AMPHETAMINE-DEXTROAMPHETAMINE 15 MG PO TABS: 15.0000 mg | ORAL_TABLET | Freq: Two times a day (BID) | ORAL | Status: DC

## 2013-08-19 NOTE — Telephone Encounter (Signed)
OK to provide 3 months supply

## 2013-08-19 NOTE — Telephone Encounter (Signed)
Left message that RX is ready for pick up.

## 2014-01-15 ENCOUNTER — Other Ambulatory Visit: Payer: Self-pay | Admitting: Family Medicine

## 2014-03-27 ENCOUNTER — Telehealth: Payer: Self-pay | Admitting: Family Medicine

## 2014-03-27 NOTE — Telephone Encounter (Signed)
Pt needs new rx generic adderall 15 mg °

## 2014-03-27 NOTE — Telephone Encounter (Signed)
Last visit 04/18/13 Last refill 08/19/13 #60 2 refills

## 2014-03-28 ENCOUNTER — Other Ambulatory Visit (INDEPENDENT_AMBULATORY_CARE_PROVIDER_SITE_OTHER): Payer: BC Managed Care – PPO

## 2014-03-28 DIAGNOSIS — Z0000 Encounter for general adult medical examination without abnormal findings: Secondary | ICD-10-CM

## 2014-03-28 LAB — POCT URINALYSIS DIPSTICK
BILIRUBIN UA: NEGATIVE
Glucose, UA: NEGATIVE
KETONES UA: NEGATIVE
LEUKOCYTES UA: NEGATIVE
Nitrite, UA: NEGATIVE
PROTEIN UA: NEGATIVE
Urobilinogen, UA: 0.2
pH, UA: 5

## 2014-03-28 MED ORDER — AMPHETAMINE-DEXTROAMPHETAMINE 15 MG PO TABS: 15.0000 mg | ORAL_TABLET | Freq: Two times a day (BID) | ORAL | Status: DC

## 2014-03-28 NOTE — Telephone Encounter (Signed)
Needs office follow up by September.  Refill once.

## 2014-03-28 NOTE — Telephone Encounter (Signed)
Pt is aware that RX is ready for pick up. 

## 2014-03-29 LAB — CBC WITH DIFFERENTIAL/PLATELET
Basophils Absolute: 0 10*3/uL (ref 0.0–0.1)
Basophils Relative: 0.5 % (ref 0.0–3.0)
EOS PCT: 2.1 % (ref 0.0–5.0)
Eosinophils Absolute: 0.1 10*3/uL (ref 0.0–0.7)
HEMATOCRIT: 39.3 % (ref 36.0–46.0)
Hemoglobin: 12.9 g/dL (ref 12.0–15.0)
Lymphocytes Relative: 29.4 % (ref 12.0–46.0)
Lymphs Abs: 1.6 10*3/uL (ref 0.7–4.0)
MCHC: 32.9 g/dL (ref 30.0–36.0)
MCV: 89.5 fl (ref 78.0–100.0)
MONOS PCT: 5.9 % (ref 3.0–12.0)
Monocytes Absolute: 0.3 10*3/uL (ref 0.1–1.0)
NEUTROS PCT: 62.1 % (ref 43.0–77.0)
Neutro Abs: 3.3 10*3/uL (ref 1.4–7.7)
PLATELETS: 285 10*3/uL (ref 150.0–400.0)
RBC: 4.38 Mil/uL (ref 3.87–5.11)
RDW: 14.5 % (ref 11.5–15.5)
WBC: 5.3 10*3/uL (ref 4.0–10.5)

## 2014-03-29 LAB — BASIC METABOLIC PANEL
BUN: 16 mg/dL (ref 6–23)
CHLORIDE: 103 meq/L (ref 96–112)
CO2: 28 mEq/L (ref 19–32)
CREATININE: 0.7 mg/dL (ref 0.4–1.2)
Calcium: 9.1 mg/dL (ref 8.4–10.5)
GFR: 93.58 mL/min (ref >60.00)
Glucose, Bld: 83 mg/dL (ref 70–99)
POTASSIUM: 4.7 meq/L (ref 3.5–5.1)
Sodium: 139 mEq/L (ref 135–145)

## 2014-03-29 LAB — LIPID PANEL
CHOL/HDL RATIO: 2
Cholesterol: 223 mg/dL — ABNORMAL HIGH (ref 0–200)
HDL: 91.1 mg/dL (ref >39.00)
LDL CALC: 120 mg/dL — AB (ref 0–99)
NONHDL: 131.9
Triglycerides: 60 mg/dL (ref 0.0–149.0)
VLDL: 12 mg/dL (ref 0.0–40.0)

## 2014-03-29 LAB — TSH: TSH: 0.69 u[IU]/mL (ref 0.35–4.50)

## 2014-03-29 LAB — HEPATIC FUNCTION PANEL
ALT: 16 U/L (ref 0–35)
AST: 22 U/L (ref 0–37)
Albumin: 4.1 g/dL (ref 3.5–5.2)
Alkaline Phosphatase: 48 U/L (ref 39–117)
BILIRUBIN DIRECT: 0 mg/dL (ref 0.0–0.3)
BILIRUBIN TOTAL: 0.5 mg/dL (ref 0.2–1.2)
Total Protein: 6.6 g/dL (ref 6.0–8.3)

## 2014-04-04 ENCOUNTER — Encounter: Payer: Self-pay | Admitting: Family Medicine

## 2014-04-04 ENCOUNTER — Ambulatory Visit (INDEPENDENT_AMBULATORY_CARE_PROVIDER_SITE_OTHER): Payer: BC Managed Care – PPO | Admitting: Family Medicine

## 2014-04-04 VITALS — BP 126/80 | HR 96 | Temp 98.3°F | Ht 66.0 in | Wt 159.0 lb

## 2014-04-04 DIAGNOSIS — Z0000 Encounter for general adult medical examination without abnormal findings: Secondary | ICD-10-CM

## 2014-04-04 DIAGNOSIS — Z23 Encounter for immunization: Secondary | ICD-10-CM

## 2014-04-04 DIAGNOSIS — R319 Hematuria, unspecified: Secondary | ICD-10-CM

## 2014-04-04 LAB — POCT URINALYSIS DIPSTICK
BILIRUBIN UA: NEGATIVE
Glucose, UA: NEGATIVE
Ketones, UA: NEGATIVE
Leukocytes, UA: NEGATIVE
NITRITE UA: NEGATIVE
PH UA: 5.5
PROTEIN UA: NEGATIVE
Spec Grav, UA: <=1.005
Urobilinogen, UA: 0.2

## 2014-04-04 LAB — URINALYSIS, MICROSCOPIC ONLY: WBC, UA: NONE SEEN (ref 0–?)

## 2014-04-04 MED ORDER — VARENICLINE TARTRATE 1 MG PO TABS: 1.0000 mg | ORAL_TABLET | Freq: Two times a day (BID) | ORAL | Status: DC

## 2014-04-04 MED ORDER — ESTRADIOL 1 MG PO TABS: 1.0000 mg | ORAL_TABLET | Freq: Every day | ORAL | Status: DC

## 2014-04-04 MED ORDER — IMIPRAMINE HCL 50 MG PO TABS: 50.0000 mg | ORAL_TABLET | Freq: Every day | ORAL | Status: DC

## 2014-04-04 MED ORDER — VARENICLINE TARTRATE 0.5 MG X 11 & 1 MG X 42 PO MISC: ORAL | Status: DC

## 2014-04-04 NOTE — Progress Notes (Signed)
Pre visit review using our clinic review tool, if applicable. No additional management support is needed unless otherwise documented below in the visit note. 

## 2014-04-04 NOTE — Patient Instructions (Signed)
Smoking Cessation Quitting smoking is important to your health and has many advantages. However, it is not always easy to quit since nicotine is a very addictive drug. Oftentimes, people try 3 times or more before being able to quit. This document explains the best ways for you to prepare to quit smoking. Quitting takes hard work and a lot of effort, but you can do it. ADVANTAGES OF QUITTING SMOKING  You will live longer, feel better, and live better.  Your body will feel the impact of quitting smoking almost immediately.  Within 20 minutes, blood pressure decreases. Your pulse returns to its normal level.  After 8 hours, carbon monoxide levels in the blood return to normal. Your oxygen level increases.  After 24 hours, the chance of having a heart attack starts to decrease. Your breath, hair, and body stop smelling like smoke.  After 48 hours, damaged nerve endings begin to recover. Your sense of taste and smell improve.  After 72 hours, the body is virtually free of nicotine. Your bronchial tubes relax and breathing becomes easier.  After 2 to 12 weeks, lungs can hold more air. Exercise becomes easier and circulation improves.  The risk of having a heart attack, stroke, cancer, or lung disease is greatly reduced.  After 1 year, the risk of coronary heart disease is cut in half.  After 5 years, the risk of stroke falls to the same as a nonsmoker.  After 10 years, the risk of lung cancer is cut in half and the risk of other cancers decreases significantly.  After 15 years, the risk of coronary heart disease drops, usually to the level of a nonsmoker.  If you are pregnant, quitting smoking will improve your chances of having a healthy baby.  The people you live with, especially any children, will be healthier.  You will have extra money to spend on things other than cigarettes. QUESTIONS TO THINK ABOUT BEFORE ATTEMPTING TO QUIT You may want to talk about your answers with your  health care provider.  Why do you want to quit?  If you tried to quit in the past, what helped and what did not?  What will be the most difficult situations for you after you quit? How will you plan to handle them?  Who can help you through the tough times? Your family? Friends? A health care provider?  What pleasures do you get from smoking? What ways can you still get pleasure if you quit? Here are some questions to ask your health care provider:  How can you help me to be successful at quitting?  What medicine do you think would be best for me and how should I take it?  What should I do if I need more help?  What is smoking withdrawal like? How can I get information on withdrawal? GET READY  Set a quit date.  Change your environment by getting rid of all cigarettes, ashtrays, matches, and lighters in your home, car, or work. Do not let people smoke in your home.  Review your past attempts to quit. Think about what worked and what did not. GET SUPPORT AND ENCOURAGEMENT You have a better chance of being successful if you have help. You can get support in many ways.  Tell your family, friends, and coworkers that you are going to quit and need their support. Ask them not to smoke around you.  Get individual, group, or telephone counseling and support. Programs are available at local hospitals and health centers. Call   your local health department for information about programs in your area.  Spiritual beliefs and practices may help some smokers quit.  Download a "quit meter" on your computer to keep track of quit statistics, such as how long you have gone without smoking, cigarettes not smoked, and money saved.  Get a self-help book about quitting smoking and staying off tobacco. [place]upora yourself from urges to smoke. Talk to someone, go for a walk, or occupy your time with a task.  Change your normal routine. Take a different route to work.  Drink tea instead of coffee. Eat breakfast in a different place.  Reduce your stress. Take a hot bath, exercise, or read a book.  Plan something enjoyable to do every day. Reward yourself for not smoking.  Explore interactive web-based programs that specialize in helping you quit. GET MEDICINE AND USE IT CORRECTLY Medicines can help you stop smoking and decrease the urge to smoke. Combining medicine with the above behavioral methods and support can greatly increase your chances of successfully quitting smoking.  Nicotine replacement therapy helps deliver nicotine to your body without the negative effects and risks of smoking. Nicotine replacement therapy includes nicotine gum, lozenges, inhalers, nasal sprays, and skin patches. Some may be available over-the-counter and others require a prescription.  Antidepressant medicine helps people abstain from smoking, but how this works is unknown. This medicine is available by prescription.  Nicotinic receptor partial agonist medicine simulates the effect of nicotine in your brain. This medicine is available by prescription. Ask your health care provider for advice about which medicines to use and how to use them based on your health history. Your health care provider will tell you what side effects to look out for if you choose to be on a medicine or therapy. Carefully read the information on the package. Do not use any other product containing nicotine while using a nicotine replacement product.  RELAPSE OR DIFFICULT SITUATIONS Most relapses occur within the first 3 months after quitting. Do not be discouraged if you start smoking again. Remember, most people try several times before finally quitting. You may have symptoms of withdrawal because your body is used to nicotine. You may crave cigarettes, be irritable, feel very hungry, cough often, get headaches, or have difficulty concentrating. The withdrawal symptoms are only temporary. They are strongest  when you first quit, but they will go away within 10-14 days. To reduce the chances of relapse, try to:  Avoid drinking alcohol. Drinking lowers your chances of successfully quitting.  Reduce the amount of caffeine you consume. Once you quit smoking, the amount of caffeine in your body increases and can give you symptoms, such as a rapid heartbeat, sweating, and anxiety.  Avoid smokers because they can make you want to smoke.  Do not let weight gain distract you. Many smokers will gain weight when they quit, usually less than 10 pounds. Eat a healthy diet and stay active. You can always lose the weight gained after you quit.  Find ways to improve your mood other than smoking. FOR MORE INFORMATION  www.smokefree.gov  Document Released: 07/19/2001 Document Revised: 12/09/2013 Document Reviewed: 11/03/2011 [facility] Patient Information 2015 [place], [place]. This information is not intended to replace advice given to you by your health care provider. Make sure you discuss any questions you have with your health care provider.  Set up repeat mammogram Calcium 1200 mg daily and vitamin D [phone] international units daily

## 2014-04-04 NOTE — Progress Notes (Signed)
Subjective:    Patient ID: Kelly Gilbert, female    DOB: 15-May-1961, 53 y.o.   MRN: [id number]  HPI  Patient seen for complete physical.  Had colonoscopy last year. She is overdue for mammogram. She's had previous hysterectomy. She is still smoking and desires to try Chantix which worked for her once before. Generally feels well. She takes imipramine for migraine prophylaxis. Requesting refills.  Past Medical History  Diagnosis Date  . DEPRESSION 09/23/2009  . Migraine headache   . Lesion of bladder   . PONV (postoperative nausea and vomiting)    Past Surgical History  Procedure Laterality Date  . Benign left breaset bx  1992  . Cystoscopy with biopsy  01/16/2012    Procedure: CYSTOSCOPY WITH BIOPSY;  Surgeon: Molli Hazard, MD;  Location: [facility];  Service: Urology;  Laterality: N/A;  1 hour requested for this case  [facility]    . Vaginal hysterectomy  1999    EPIDURAL ANES.    reports that she has been smoking Cigarettes.  She has a 8.75 pack-year smoking history. She has never used smokeless tobacco. She reports that she drinks about 1.2 ounces of alcohol per week. Her drug history is not on file. family history includes Cancer in her father; Cancer (age of onset: 61) in her mother; Hypertension in her father and maternal grandmother. There is no history of Colon cancer, Pancreatic cancer, or Stomach cancer. Allergies  Allergen Reactions  . Codeine Sulfate Nausea And Vomiting  . Sulfa Antibiotics Hives and Swelling      Review of Systems  Constitutional: Negative for fever, activity change, appetite change, fatigue and unexpected weight change.  HENT: Negative for ear pain, hearing loss, sore throat and trouble swallowing.   Eyes: Negative for visual disturbance.  Respiratory: Negative for cough and shortness of breath.   Cardiovascular: Negative for chest pain and palpitations.  Gastrointestinal: Negative for abdominal pain, diarrhea,  constipation and blood in stool.  Genitourinary: Negative for dysuria and hematuria.  Musculoskeletal: Negative for arthralgias, back pain and myalgias.  Skin: Negative for rash.  Neurological: Negative for dizziness, syncope and headaches.  Hematological: Negative for adenopathy.  Psychiatric/Behavioral: Negative for confusion and dysphoric mood.       Objective:   Physical Exam  Constitutional: She is oriented to person, place, and time. She appears well-developed and well-nourished.  HENT:  Head: Normocephalic and atraumatic.  Eyes: EOM are normal. Pupils are equal, round, and reactive to light.  Neck: Normal range of motion. Neck supple. No thyromegaly present.  Cardiovascular: Normal rate, regular rhythm and normal heart sounds.   No murmur heard. Pulmonary/Chest: Breath sounds normal. No respiratory distress. She has no wheezes. She has no rales.  Abdominal: Soft. Bowel sounds are normal. She exhibits no distension and no mass. There is no tenderness. There is no rebound and no guarding.  Musculoskeletal: Normal range of motion. She exhibits no edema.  Lymphadenopathy:    She has no cervical adenopathy.  Neurological: She is alert and oriented to person, place, and time. She displays normal reflexes. No cranial nerve deficit.  Skin: No rash noted.  Psychiatric: She has a normal mood and affect. Her behavior is normal. Judgment and thought content normal.          Assessment & Plan:  Complete physical. We've recommended regular calcium and vitamin D consumption. Set up mammogram. Tetanus and flu vaccines given. Schedule mammogram. No Pap smear since she's had hysterectomy for benign disease.  Colonoscopy is up-to-date  Smoking cessation discussed. She desires to start Chantix and prescriptions written.  > 3 minutes and < 10 minutes spent discussing smoking cessation and possible meds.

## 2014-04-07 ENCOUNTER — Other Ambulatory Visit: Payer: Self-pay

## 2014-04-30 ENCOUNTER — Telehealth: Payer: Self-pay | Admitting: Family Medicine

## 2014-04-30 NOTE — Telephone Encounter (Signed)
Pt request refill amphetamine-dextroamphetamine (ADDERALL) 15 MG tablet 90 day

## 2014-04-30 NOTE — Telephone Encounter (Signed)
Pt was given on #60 03-28-14 no refills Last visit 04/04/14

## 2014-05-01 MED ORDER — AMPHETAMINE-DEXTROAMPHETAMINE 15 MG PO TABS: 15.0000 mg | ORAL_TABLET | Freq: Two times a day (BID) | ORAL | Status: DC

## 2014-05-01 NOTE — Telephone Encounter (Signed)
Refill OK for 3 months. 

## 2014-05-01 NOTE — Telephone Encounter (Signed)
Pt aware that RX is ready for pick up  

## 2014-05-02 ENCOUNTER — Other Ambulatory Visit: Payer: Self-pay

## 2014-05-02 DIAGNOSIS — Z1231 Encounter for screening mammogram for malignant neoplasm of breast: Secondary | ICD-10-CM

## 2014-05-13 ENCOUNTER — Ambulatory Visit
Admission: RE | Admit: 2014-05-13 | Discharge: 2014-05-13 | Disposition: A | Discharge status: Home / Self Care | Payer: 59 | Source: Ambulatory Visit

## 2014-05-13 DIAGNOSIS — Z1231 Encounter for screening mammogram for malignant neoplasm of breast: Secondary | ICD-10-CM

## 2014-05-13 IMAGING — MG MM DIGITAL SCREENING BILAT
4 series · 4 of 4 positions shown · non-contrast
Comparison: Previous exam(s).

CLINICAL DATA: Screening.

EXAM:
DIGITAL SCREENING BILATERAL MAMMOGRAM WITH CAD

[R MLO]
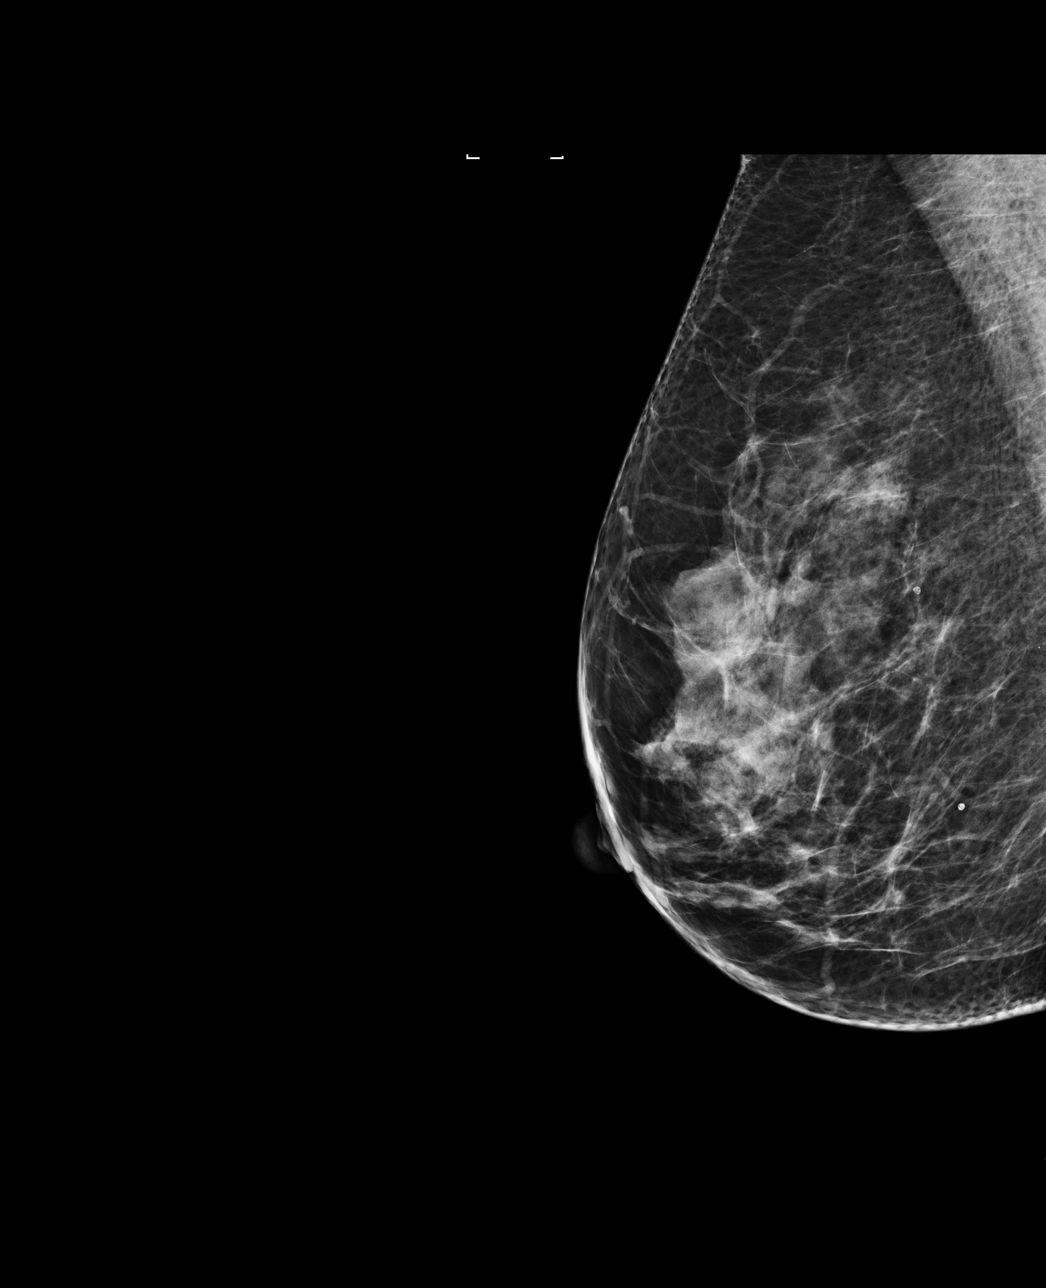

[R CC]
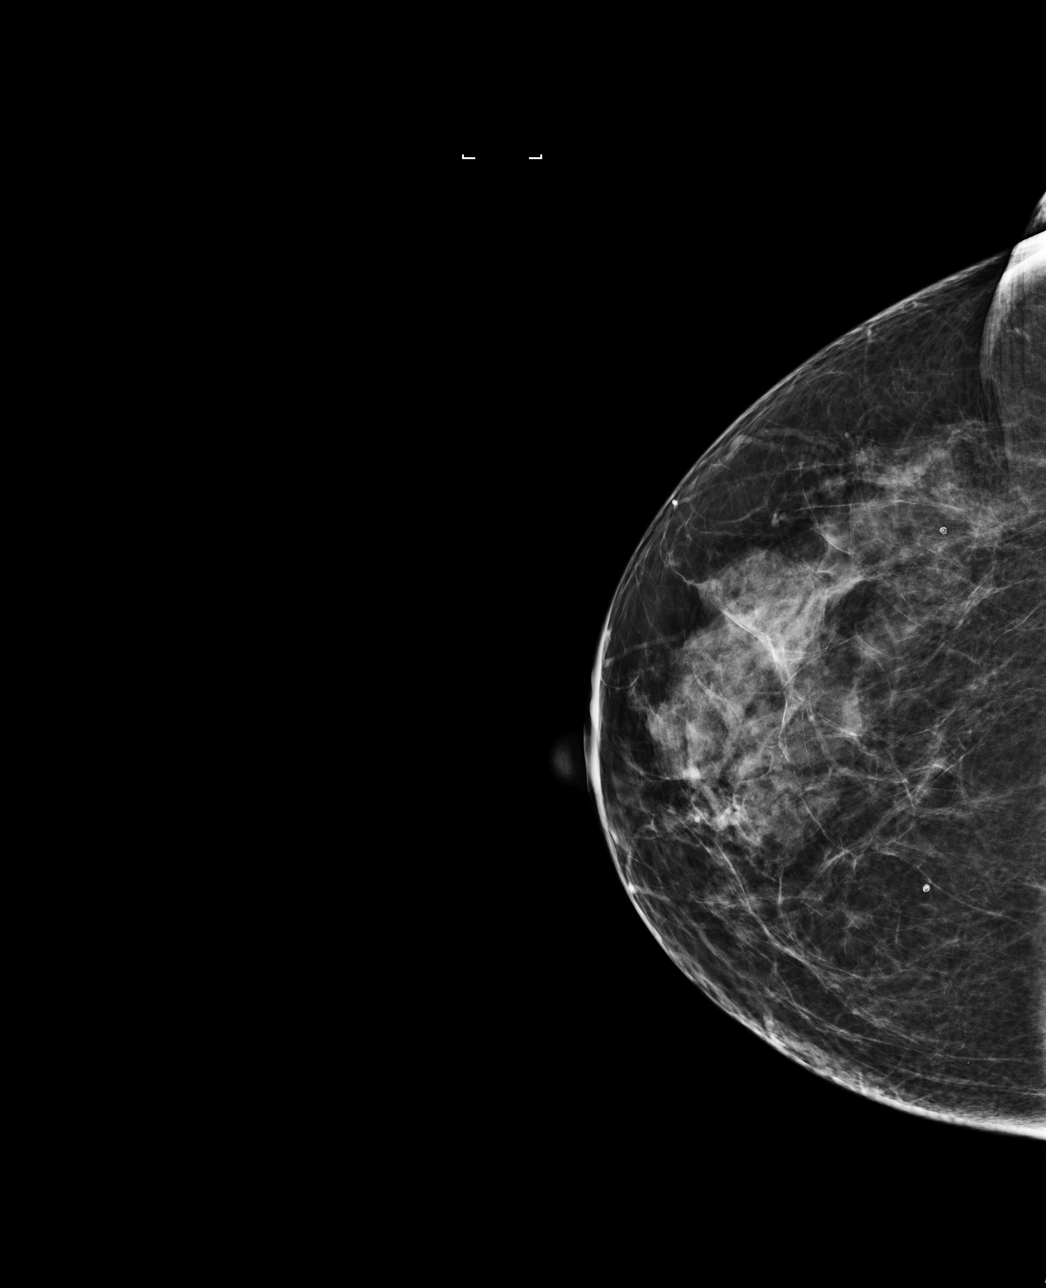

[L MLO]
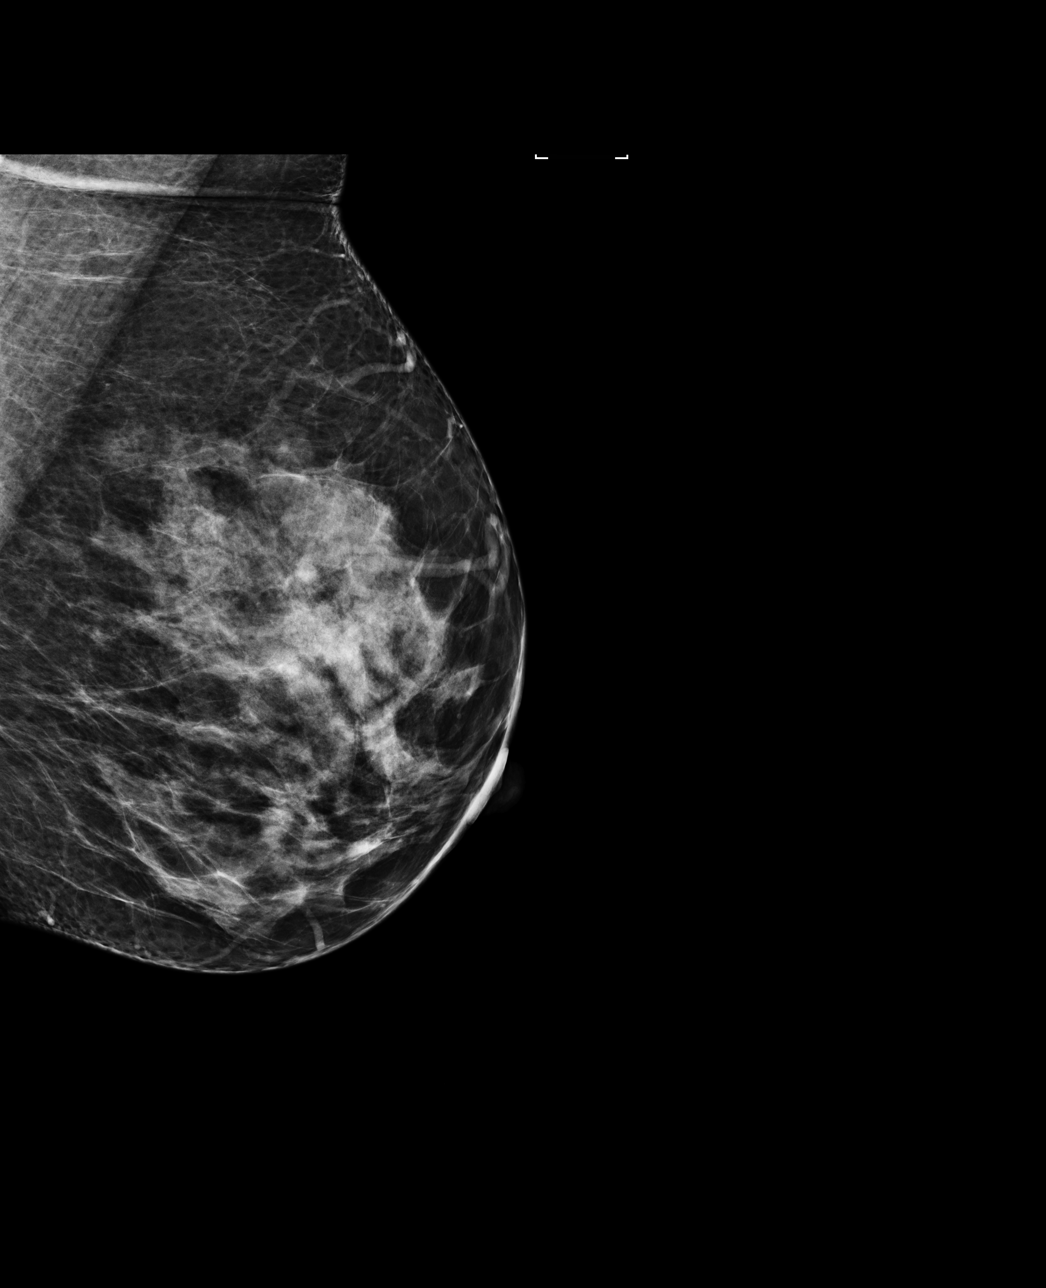

[L CC]
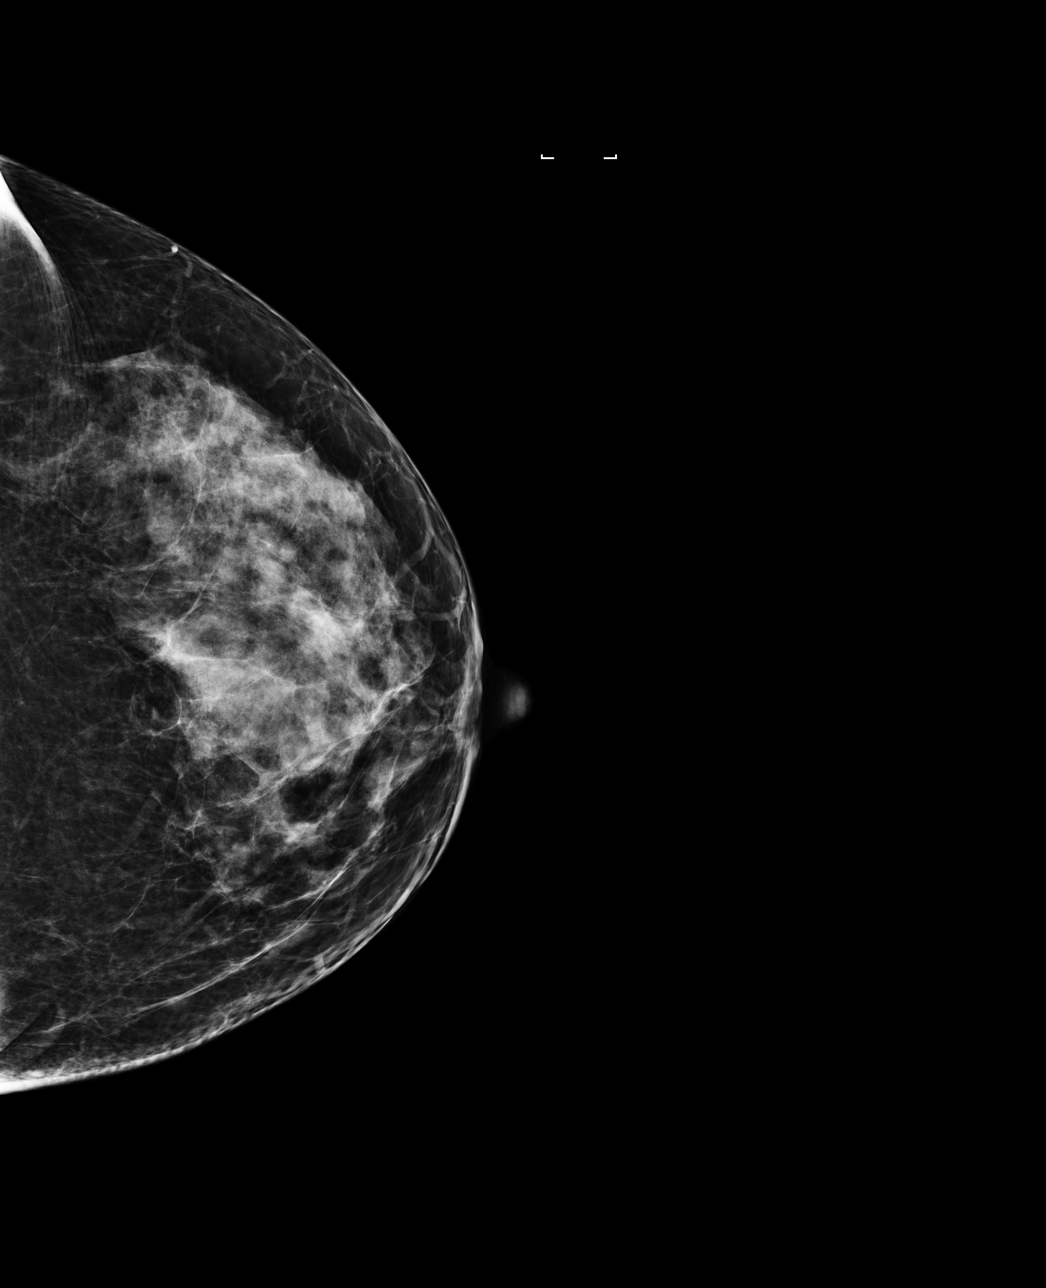

[4 of 4 positions shown; findings below may reference images not displayed]

ACR Breast Density Category c: The breast tissue is heterogeneously
dense, which may obscure small masses.
FINDINGS: There are no findings suspicious for malignancy. Images were
processed with CAD.
IMPRESSION: No mammographic evidence of malignancy. A result letter of this
screening mammogram will be mailed directly to the patient.

RECOMMENDATION:
Screening mammogram in one year. (Code:[0J])

BI-RADS CATEGORY  1: Negative.

## 2014-06-04 ENCOUNTER — Telehealth: Payer: Self-pay | Admitting: Family Medicine

## 2014-06-04 NOTE — Telephone Encounter (Signed)
Let patient know.

## 2014-06-04 NOTE — Telephone Encounter (Signed)
[employer] denied [place] request for Chantix.  All of the following criteria must be met:  - pt has received smoking cessation counseling - pt has a history of failure, contraindication, or intolerance to one of the following:   Habitrol OTC, Nicoderm CQ OTC, Nicorette gum OTC, Nicorette lozenge OTC, Nicorette mini-lozenge OTC, Thrive gum OTC, Thrive lozenge OTC - pt is not currently taking nicotine replacement therapy - pt is not currently on bupropion

## 2014-06-04 NOTE — Telephone Encounter (Signed)
Pt informed

## 2015-03-09 ENCOUNTER — Telehealth: Payer: Self-pay | Admitting: Family Medicine

## 2015-03-09 NOTE — Telephone Encounter (Signed)
See below

## 2015-03-09 NOTE — Telephone Encounter (Signed)
Noted. Pt has appointment 03/10/15

## 2015-03-09 NOTE — Telephone Encounter (Signed)
[facility] - Client [place] [facility]     Patient Name: Kelly Gilbert Initial Comment Caller states she was walking some property and when she got home she found nine ticks on her. She is not sure any of them got imbedded. She has had a headache and an upset stomach.   DOB: 05-20-1961      Nurse Assessment  Nurse: Luther Parody, RN, Malachy Mood Date/Time (Eastern Time): 03/09/2015 3:57:58 PM  Confirm and document reason for call. If symptomatic, describe symptoms. ---Caller states that she has had a h/a since last Friday along with nausea. She was concerned because approx 3-4 wks ago she removed 8 ticks from her body after being in the woods. State that 8 of the ticks were alive and only one very tiny on had embedded but she was able to remove it. She is concerned that she should have a blood test for lyme's disease.  Has the patient traveled out of the country within the last 30 days? ---Not Applicable  Does the patient require triage? ---Yes  Related visit to physician within the last 2 weeks? ---No  Does the PT have any chronic conditions? (i.e. diabetes, asthma, etc.) ---Yes  List chronic conditions. ---hot flashes, migraine  Did the patient indicate they were pregnant? ---No    Guidelines     Guideline Title Affirmed Question Affirmed Notes   Tick Bite Tick bite with no complications (all triage questions negative)    Headache [1] MODERATE headache (e.g., interferes with normal activities) AND [2] present > 24 hours AND [3] unexplained (Exceptions: analgesics not tried, typical migraine, or headache part of viral illness)    Final Disposition User   See Physician within [place], RN, [employer]     Referrals   REFERRED TO PCP OFFICE   Disagree/Comply: Comply      Disagree/Comply: Comply

## 2015-03-10 ENCOUNTER — Encounter: Payer: Self-pay | Admitting: Family Medicine

## 2015-03-10 ENCOUNTER — Ambulatory Visit (INDEPENDENT_AMBULATORY_CARE_PROVIDER_SITE_OTHER): Payer: BLUE CROSS/BLUE SHIELD | Admitting: Family Medicine

## 2015-03-10 VITALS — BP 130/80 | HR 79 | Temp 97.8°F | Wt 171.0 lb

## 2015-03-10 DIAGNOSIS — T148 Other injury of unspecified body region: Secondary | ICD-10-CM

## 2015-03-10 DIAGNOSIS — W57XXXA Bitten or stung by nonvenomous insect and other nonvenomous arthropods, initial encounter: Secondary | ICD-10-CM

## 2015-03-10 MED ORDER — DOXYCYCLINE HYCLATE 100 MG PO CAPS: 100.0000 mg | ORAL_CAPSULE | Freq: Two times a day (BID) | ORAL | Status: DC

## 2015-03-10 NOTE — Progress Notes (Signed)
Pre visit review using our clinic review tool, if applicable. No additional management support is needed unless otherwise documented below in the visit note. 

## 2015-03-10 NOTE — Progress Notes (Signed)
   Subjective:    Patient ID: Kelly Gilbert, female    DOB: [birth year], 54 y.o.   MRN: [id number]  HPI Patient relates about 3-4 weeks ago she pulled off 9 very small deer ticks and most were crawling but a couple had bitten in. Over last weekend she developed some nonspecific malaise and headache. No known fevers or chills. She had some neck soreness increased above baseline but no other arthralgias. No skin rash. Someone who been walking with her few weeks ago was diagnosed with [facility] spotted fever. Patient is not had any petechial rash. She was concerned about possible Lyme disease.  Past Medical History  Diagnosis Date  . DEPRESSION 09/23/2009  . Migraine headache   . Lesion of bladder   . PONV (postoperative nausea and vomiting)    Past Surgical History  Procedure Laterality Date  . Benign left breaset bx  1992  . Cystoscopy with biopsy  01/16/2012    Procedure: CYSTOSCOPY WITH BIOPSY;  Surgeon: Molli Hazard, MD;  Location: [facility];  Service: Urology;  Laterality: N/A;  1 hour requested for this case  [facility]    . Vaginal hysterectomy  1999    EPIDURAL ANES.    reports that she has been smoking Cigarettes.  She has a 8.75 pack-year smoking history. She has never used smokeless tobacco. She reports that she drinks about 1.2 oz of alcohol per week. Her drug history is not on file. family history includes Cancer in her father; Cancer (age of onset: 73) in her mother; Hypertension in her father and maternal grandmother. There is no history of Colon cancer, Pancreatic cancer, or Stomach cancer. Allergies  Allergen Reactions  . Codeine Sulfate Nausea And Vomiting  . Sulfa Antibiotics Hives and Swelling      Review of Systems  Constitutional: Positive for fatigue. Negative for fever and chills.  Respiratory: Negative for shortness of breath.   Gastrointestinal: Positive for nausea. Negative for vomiting and abdominal pain.    Musculoskeletal: Positive for neck pain. Negative for arthralgias.  Skin: Negative for rash.  Neurological: Positive for headaches.       Objective:   Physical Exam  Constitutional: She appears well-developed and well-nourished.  HENT:  Mouth/Throat: Oropharynx is clear and moist.  Neck: Neck supple.  Cardiovascular: Normal rate and regular rhythm.   Pulmonary/Chest: Effort normal and breath sounds normal. No respiratory distress. She has no wheezes. She has no rales.  Lymphadenopathy:    She has no cervical adenopathy.  Skin: No rash noted.          Assessment & Plan:  Recent multiple tick bites. She does not have any rash to suggest erythema migrans. Suspect clinically fairly low risk of Lyme disease. She is several weeks out and we discussed limitations of antibody testing for Lyme disease. Patient would like to go ahead and test nevertheless discussing pros and cons. If she has any fever or progressive arthralgias consider coverage with doxycycline 100 mg twice daily pending results above

## 2015-03-10 NOTE — Patient Instructions (Signed)
Tick Bite Information Ticks are insects that attach themselves to the skin and draw blood for food. There are various types of ticks. Common types include wood ticks and deer ticks. Most ticks live in shrubs and grassy areas. Ticks can climb onto your body when you make contact with leaves or grass where the tick is waiting. The most common places on the body for ticks to attach themselves are the scalp, neck, armpits, waist, and groin. Most tick bites are harmless, but sometimes ticks carry germs that cause diseases. These germs can be spread to a person during the tick's feeding process. The chance of a disease spreading through a tick bite depends on:   The type of tick.  Time of year.   How long the tick is attached.   Geographic location.  HOW CAN YOU PREVENT TICK BITES? Take these steps to help prevent tick bites when you are outdoors:  Wear protective clothing. Long sleeves and long pants are best.   Wear white clothes so you can see ticks more easily.  Tuck your pant legs into your socks.   If walking on a trail, stay in the middle of the trail to avoid brushing against bushes.  Avoid walking through areas with long grass.  Put insect repellent on all exposed skin and along boot tops, pant legs, and sleeve cuffs.   Check clothing, hair, and skin repeatedly and before going inside.   Brush off any ticks that are not attached.  Take a shower or bath as soon as possible after being outdoors.  WHAT IS THE PROPER WAY TO REMOVE A TICK? Ticks should be removed as soon as possible to help prevent diseases caused by tick bites. 1. If latex gloves are available, put them on before trying to remove a tick.  2. Using fine-point tweezers, grasp the tick as close to the skin as possible. You may also use curved forceps or a tick removal tool. Grasp the tick as close to its head as possible. Avoid grasping the tick on its body. 3. Pull gently with steady upward pressure until  the tick lets go. Do not twist the tick or jerk it suddenly. This may break off the tick's head or mouth parts. 4. Do not squeeze or crush the tick's body. This could force disease-carrying fluids from the tick into your body.  5. After the tick is removed, wash the bite area and your hands with soap and water or other disinfectant such as alcohol. 6. Apply a small amount of antiseptic cream or ointment to the bite site.  7. Wash and disinfect any instruments that were used.  Do not try to remove a tick by applying a hot match, petroleum jelly, or fingernail polish to the tick. These methods do not work and may increase the chances of disease being spread from the tick bite.  WHEN SHOULD YOU SEEK MEDICAL CARE? Contact your health care provider if you are unable to remove a tick from your skin or if a part of the tick breaks off and is stuck in the skin.  After a tick bite, you need to be aware of signs and symptoms that could be related to diseases spread by ticks. Contact your health care provider if you develop any of the following in the days or weeks after the tick bite:  Unexplained fever.  Rash. A circular rash that appears days or weeks after the tick bite may indicate the possibility of Lyme disease. The rash may resemble   a target with a bull's-eye and may occur at a different part of your body than the tick bite.  Redness and swelling in the area of the tick bite.   Tender, swollen lymph glands.   Diarrhea.   Weight loss.   Cough.   Fatigue.   Muscle, joint, or bone pain.   Abdominal pain.   Headache.   Lethargy or a change in your level of consciousness.  Difficulty walking or moving your legs.   Numbness in the legs.   Paralysis.  Shortness of breath.   Confusion.   Repeated vomiting.  Document Released: 07/22/2000 Document Revised: 05/15/2013 Document Reviewed: 01/02/2013 [employer] Patient Information 2015 [employer]. This information is  not intended to replace advice given to you by your health care provider. Make sure you discuss any questions you have with your health care provider.  

## 2015-03-11 LAB — B. BURGDORFI ANTIBODIES: B burgdorferi Ab IgG+IgM: 0.52 {ISR}

## 2015-04-05 ENCOUNTER — Other Ambulatory Visit: Payer: Self-pay | Admitting: Family Medicine

## 2015-07-29 ENCOUNTER — Telehealth: Payer: Self-pay | Admitting: Family Medicine

## 2015-07-29 NOTE — Telephone Encounter (Signed)
Pt has been sch

## 2015-07-29 NOTE — Telephone Encounter (Signed)
Please schedule appointment per Dr. Erick Blinks recommendation

## 2015-07-29 NOTE — Telephone Encounter (Signed)
Needs an appt for antibiotics. Thanks.

## 2015-07-29 NOTE — Telephone Encounter (Signed)
Really needs to be assessed. Also, Zithromax is showing increased resistance from H influenza

## 2015-07-29 NOTE — Telephone Encounter (Signed)
Pt is 99% sure she has sinus infection. Pt is having cough, brownish phlegm,ha and congestion. Pt would like zpak call into [facility]. Pt does not have time to come in

## 2015-07-29 NOTE — Telephone Encounter (Signed)
Pt would like [place] to ask anyway if she can get antibiotic w/o being seen?

## 2015-07-30 ENCOUNTER — Ambulatory Visit (INDEPENDENT_AMBULATORY_CARE_PROVIDER_SITE_OTHER): Payer: BLUE CROSS/BLUE SHIELD | Admitting: Family Medicine

## 2015-07-30 VITALS — BP 90/70 | HR 90 | Temp 98.2°F | Ht 66.0 in | Wt 174.9 lb

## 2015-07-30 DIAGNOSIS — J019 Acute sinusitis, unspecified: Secondary | ICD-10-CM

## 2015-07-30 MED ORDER — AMOXICILLIN-POT CLAVULANATE 875-125 MG PO TABS: 1.0000 | ORAL_TABLET | Freq: Two times a day (BID) | ORAL | Status: DC

## 2015-07-30 NOTE — Progress Notes (Signed)
Pre visit review using our clinic review tool, if applicable. No additional management support is needed unless otherwise documented below in the visit note. 

## 2015-07-30 NOTE — Progress Notes (Signed)
   Subjective:    Patient ID: Kelly Gilbert, female    DOB: January 19, 1961, 54 y.o.   MRN: [id number]  HPI Acute visit Patient seen with several week history of sinus congestion. She takes antihistamines regularly. She has almost daily headaches, pansinusitis pressure, yellow-green nasal discharge, increased malaise. Denies fever or chills. Rare cough. Occasional sore throat. No laryngitis symptoms.  Past Medical History  Diagnosis Date  . DEPRESSION 09/23/2009  . Migraine headache   . Lesion of bladder   . PONV (postoperative nausea and vomiting)    Past Surgical History  Procedure Laterality Date  . Benign left breaset bx  1992  . Cystoscopy with biopsy  01/16/2012    Procedure: CYSTOSCOPY WITH BIOPSY;  Surgeon: Molli Hazard, MD;  Location: [facility];  Service: Urology;  Laterality: N/A;  1 hour requested for this case  [facility]    . Vaginal hysterectomy  1999    EPIDURAL ANES.    reports that she has been smoking Cigarettes.  She has a 8.75 pack-year smoking history. She has never used smokeless tobacco. She reports that she drinks about 1.2 oz of alcohol per week. Her drug history is not on file. family history includes Cancer in her father; Cancer (age of onset: 20) in her mother; Hypertension in her father and maternal grandmother. There is no history of Colon cancer, Pancreatic cancer, or Stomach cancer. Allergies  Allergen Reactions  . Codeine Sulfate Nausea And Vomiting  . Sulfa Antibiotics Hives and Swelling      Review of Systems  Constitutional: Negative for fever and chills.  HENT: Positive for congestion and sinus pressure.   Respiratory: Positive for cough.   Neurological: Positive for headaches.       Objective:   Physical Exam  Constitutional: She appears well-developed and well-nourished.  HENT:  Right Ear: External ear normal.  Left Ear: External ear normal.  Mouth/Throat: Oropharynx is clear and moist.  Neck: Neck  supple.  Cardiovascular: Normal rate and regular rhythm.   Pulmonary/Chest: Effort normal and breath sounds normal. No respiratory distress. She has no wheezes. She has no rales.  Lymphadenopathy:    She has no cervical adenopathy.          Assessment & Plan:  Probable acute pansinusitis. Given duration of symptoms start Augmentin 875 mg twice daily for 10 days

## 2015-07-30 NOTE — Patient Instructions (Signed)

## 2015-09-09 ENCOUNTER — Telehealth: Payer: Self-pay | Admitting: Family Medicine

## 2015-09-09 MED ORDER — AMPHETAMINE-DEXTROAMPHETAMINE 15 MG PO TABS: 15.0000 mg | ORAL_TABLET | Freq: Two times a day (BID) | ORAL | Status: DC

## 2015-09-09 NOTE — Telephone Encounter (Signed)
Refill once 

## 2015-09-09 NOTE — Telephone Encounter (Signed)
Printed for signature

## 2015-09-09 NOTE — Telephone Encounter (Signed)
Patient would like a refill on her ADDERALL medication.

## 2015-09-09 NOTE — Telephone Encounter (Signed)
Pt recently seen for an acute visit on 07/30/15 No pending appt scheduled Last refill x3 was 05/01/2014* Does not seem like she takes this regularly okay for one Rx?

## 2015-09-10 NOTE — Telephone Encounter (Signed)
Medication is up front for pick up and pt is aware.

## 2015-11-03 ENCOUNTER — Other Ambulatory Visit: Payer: Self-pay | Admitting: Family Medicine

## 2015-11-03 MED ORDER — IMIPRAMINE HCL 50 MG PO TABS: ORAL_TABLET | ORAL | Status: DC

## 2015-12-18 ENCOUNTER — Other Ambulatory Visit (INDEPENDENT_AMBULATORY_CARE_PROVIDER_SITE_OTHER): Payer: BLUE CROSS/BLUE SHIELD

## 2015-12-18 DIAGNOSIS — Z0000 Encounter for general adult medical examination without abnormal findings: Secondary | ICD-10-CM | POA: Diagnosis not present

## 2015-12-18 LAB — HEPATIC FUNCTION PANEL
ALT: 11 U/L (ref 0–35)
AST: 12 U/L (ref 0–37)
Albumin: 4.2 g/dL (ref 3.5–5.2)
Alkaline Phosphatase: 60 U/L (ref 39–117)
BILIRUBIN DIRECT: 0.1 mg/dL (ref 0.0–0.3)
BILIRUBIN TOTAL: 0.2 mg/dL (ref 0.2–1.2)
Total Protein: 6.4 g/dL (ref 6.0–8.3)

## 2015-12-18 LAB — CBC WITH DIFFERENTIAL/PLATELET
BASOS PCT: 0.7 % (ref 0.0–3.0)
Basophils Absolute: 0 10*3/uL (ref 0.0–0.1)
EOS ABS: 0.1 10*3/uL (ref 0.0–0.7)
EOS PCT: 1.6 % (ref 0.0–5.0)
HCT: 36.9 % (ref 36.0–46.0)
HEMOGLOBIN: 12.3 g/dL (ref 12.0–15.0)
LYMPHS ABS: 2 10*3/uL (ref 0.7–4.0)
Lymphocytes Relative: 32.4 % (ref 12.0–46.0)
MCHC: 33.2 g/dL (ref 30.0–36.0)
MCV: 87.8 fl (ref 78.0–100.0)
MONO ABS: 0.4 10*3/uL (ref 0.1–1.0)
Monocytes Relative: 6.3 % (ref 3.0–12.0)
NEUTROS ABS: 3.7 10*3/uL (ref 1.4–7.7)
NEUTROS PCT: 59 % (ref 43.0–77.0)
PLATELETS: 302 10*3/uL (ref 150.0–400.0)
RBC: 4.2 Mil/uL (ref 3.87–5.11)
RDW: 15 % (ref 11.5–15.5)
WBC: 6.3 10*3/uL (ref 4.0–10.5)

## 2015-12-18 LAB — BASIC METABOLIC PANEL
BUN: 16 mg/dL (ref 6–23)
CO2: 28 meq/L (ref 19–32)
CREATININE: 0.69 mg/dL (ref 0.40–1.20)
Calcium: 8.8 mg/dL (ref 8.4–10.5)
Chloride: 104 mEq/L (ref 96–112)
GFR: 94.52 mL/min (ref >60.00)
Glucose, Bld: 96 mg/dL (ref 70–99)
Potassium: 3.7 mEq/L (ref 3.5–5.1)
SODIUM: 139 meq/L (ref 135–145)

## 2015-12-18 LAB — LIPID PANEL
CHOLESTEROL: 206 mg/dL — AB (ref 0–200)
HDL: 62.3 mg/dL (ref >39.00)
LDL CALC: 125 mg/dL — AB (ref 0–99)
NonHDL: 143.55
TRIGLYCERIDES: 94 mg/dL (ref 0.0–149.0)
Total CHOL/HDL Ratio: 3
VLDL: 18.8 mg/dL (ref 0.0–40.0)

## 2015-12-18 LAB — TSH: TSH: 0.93 u[IU]/mL (ref 0.35–4.50)

## 2015-12-25 ENCOUNTER — Ambulatory Visit (INDEPENDENT_AMBULATORY_CARE_PROVIDER_SITE_OTHER): Payer: BLUE CROSS/BLUE SHIELD | Admitting: Family Medicine

## 2015-12-25 VITALS — BP 120/80 | HR 90 | Temp 97.9°F | Ht 66.0 in | Wt 172.1 lb

## 2015-12-25 DIAGNOSIS — M719 Bursopathy, unspecified: Secondary | ICD-10-CM

## 2015-12-25 DIAGNOSIS — Z0000 Encounter for general adult medical examination without abnormal findings: Secondary | ICD-10-CM | POA: Diagnosis not present

## 2015-12-25 MED ORDER — DIPHENHYDRAMINE HCL (SLEEP) 25 MG PO TABS: 25.0000 mg | ORAL_TABLET | Freq: Every evening | ORAL | Status: DC | PRN

## 2015-12-25 MED ORDER — ESTRADIOL 1 MG PO TABS: ORAL_TABLET | ORAL | Status: DC

## 2015-12-25 MED ORDER — METHYLPREDNISOLONE ACETATE 80 MG/ML IJ SUSP
80.0000 mg | Freq: Once | INTRAMUSCULAR | Status: AC
  Administered 2015-12-25: 80 mg via INTRA_ARTICULAR

## 2015-12-25 NOTE — Progress Notes (Signed)
Subjective:    Patient ID: Kelly Gilbert, female    DOB: [birth year], 55 y.o.   MRN: [id number]  HPI Patient seen for physical exam. She's had prior hysterectomy so no indication for Pap smear. Last mammogram was October 2015. She plans to set up repeat and has couple months  Still smokes less than half pack cigarettes per day. Had some recent mild weight gain. No chronic cough. No hemoptysis. Immunizations up-to-date.  Complains of bilateral hip pain right greater than left. Present for several years but worsening recently. Denies stiffness. She has soreness lateral hip which is worse after prolonged periods of sitting. This improves somewhat with movement but never goes fully away. Denies any lumbar back pain. No radiculopathy symptoms. No lower extremity weakness or numbness. Advil helps. Has not tried any icing.  Past Medical History  Diagnosis Date  . DEPRESSION 09/23/2009  . Migraine headache   . Lesion of bladder   . PONV (postoperative nausea and vomiting)    Past Surgical History  Procedure Laterality Date  . Benign left breaset bx  1992  . Cystoscopy with biopsy  01/16/2012    Procedure: CYSTOSCOPY WITH BIOPSY;  Surgeon: Molli Hazard, MD;  Location: [facility];  Service: Urology;  Laterality: N/A;  1 hour requested for this case  [facility]    . Vaginal hysterectomy  1999    EPIDURAL ANES.    reports that she has been smoking Cigarettes.  She has a 8.75 pack-year smoking history. She has never used smokeless tobacco. She reports that she drinks about 1.2 oz of alcohol per week. Her drug history is not on file. family history includes Cancer in her father; Cancer (age of onset: 86) in her mother; Hypertension in her father and maternal grandmother. There is no history of Colon cancer, Pancreatic cancer, or Stomach cancer. Allergies  Allergen Reactions  . Codeine Sulfate Nausea And Vomiting  . Sulfa Antibiotics Hives and Swelling       Review of Systems  Constitutional: Negative for fever, activity change, appetite change, fatigue and unexpected weight change.  HENT: Negative for ear pain, hearing loss, sore throat and trouble swallowing.   Eyes: Negative for visual disturbance.  Respiratory: Negative for cough and shortness of breath.   Cardiovascular: Negative for chest pain and palpitations.  Gastrointestinal: Negative for abdominal pain, diarrhea, constipation and blood in stool.  Genitourinary: Negative for dysuria and hematuria.  Musculoskeletal: Negative for myalgias and back pain.  Skin: Negative for rash.  Neurological: Negative for dizziness, syncope and headaches.  Hematological: Negative for adenopathy.  Psychiatric/Behavioral: Negative for confusion and dysphoric mood.       Objective:   Physical Exam  Constitutional: She is oriented to person, place, and time. She appears well-developed and well-nourished.  HENT:  Head: Normocephalic and atraumatic.  Eyes: EOM are normal. Pupils are equal, round, and reactive to light.  Neck: Normal range of motion. Neck supple. No thyromegaly present.  Cardiovascular: Normal rate, regular rhythm and normal heart sounds.   No murmur heard. Pulmonary/Chest: Breath sounds normal. No respiratory distress. She has no wheezes. She has no rales.  Abdominal: Soft. Bowel sounds are normal. She exhibits no distension and no mass. There is no tenderness. There is no rebound and no guarding.  Genitourinary:  Breasts are symmetric with no mass palpated.  Musculoskeletal: Normal range of motion. She exhibits no edema.  Full range of motion both hips. She has some tenderness over the greater trochanteric bursa bilaterally.  No visible swelling or warmth. Straight leg raise negative  Lymphadenopathy:    She has no cervical adenopathy.  Neurological: She is alert and oriented to person, place, and time. She displays normal reflexes. No cranial nerve deficit.  Skin: No rash  noted.  Psychiatric: She has a normal mood and affect. Her behavior is normal. Judgment and thought content normal.          Assessment & Plan:  #1 physical exam. Reviewed labs. No major concerns. Patient will schedule repeat mammogram. No indication for Pap smear. Colonoscopy up-to-date. We've recommended smoking cessation. Information from [place] quit line given. Establish more consistent exercise  #2 bilateral hip pain right greater than left. Suspect greater trochanteric bursitis. We have recommended some icing. Discussed risk and benefits of steroid injection patient consented. Right lateral hip area tenderness over bursa located. Prepped with Betadine. Using 25-gauge 1.5 cm needle injected 1 mL of Depo-Medrol and 1 mL of plain Xylocaine. Patient tolerated well. Touch base in 2 weeks if no improvement  Eulas Post MD [facility]

## 2015-12-25 NOTE — Progress Notes (Signed)
Pre visit review using our clinic review tool, if applicable. No additional management support is needed unless otherwise documented below in the visit note. 

## 2015-12-25 NOTE — Patient Instructions (Signed)
Hip Bursitis Bursitis is a swelling and soreness (inflammation) of a fluid-filled sac (bursa). This sac overlies and protects the joints.  CAUSES   Injury.  Overuse of the muscles surrounding the joint.  Arthritis.  Gout.  Infection.  Cold weather.  Inadequate warm-up and conditioning prior to activities. The cause may not be known.  SYMPTOMS   Mild to severe irritation.  Tenderness and swelling over the outside of the hip.  Pain with motion of the hip.  If the bursa becomes infected, a fever may be present. Redness, tenderness, and warmth will develop over the hip. Symptoms usually lessen in 3 to 4 weeks with treatment, but can come back. TREATMENT If conservative treatment does not work, your caregiver may advise draining the bursa and injecting cortisone into the area. This may speed up the healing process. This may also be used as an initial treatment of choice. HOME CARE INSTRUCTIONS   Apply ice to the affected area for 15-20 minutes every 3 to 4 hours while awake for the first 2 days. Put the ice in a plastic bag and place a towel between the bag of ice and your skin.  Rest the painful joint as much as possible, but continue to put the joint through a normal range of motion at least 4 times per day. When the pain lessens, begin normal, slow movements and usual activities to help prevent stiffness of the hip.  Only take over-the-counter or prescription medicines for pain, discomfort, or fever as directed by your caregiver.  Use crutches to limit weight bearing on the hip joint, if advised.  Elevate your painful hip to reduce swelling. Use pillows for propping and cushioning your legs and hips.  Gentle massage may provide comfort and decrease swelling. SEEK IMMEDIATE MEDICAL CARE IF:   Your pain increases even during treatment, or you are not improving.  You have a fever.  You have heat and inflammation over the involved bursa.  You have any other questions or  concerns. MAKE SURE YOU:   Understand these instructions.  Will watch your condition.  Will get help right away if you are not doing well or get worse.  Smoking Cessation, Tips for Success If you are ready to quit smoking, congratulations! You have chosen to help yourself be healthier. Cigarettes bring nicotine, tar, carbon monoxide, and other irritants into your body. Your lungs, heart, and blood vessels will be able to work better without these poisons. There are many different ways to quit smoking. Nicotine gum, nicotine patches, a nicotine inhaler, or nicotine nasal spray can help with physical craving. Hypnosis, support groups, and medicines help break the habit of smoking. WHAT THINGS CAN I DO TO MAKE QUITTING EASIER?  Here are some tips to help you quit for good:  Pick a date when you will quit smoking completely. Tell all of your friends and family about your plan to quit on that date.  Do not try to slowly cut down on the number of cigarettes you are smoking. Pick a quit date and quit smoking completely starting on that day.  Throw away all cigarettes.   Clean and remove all ashtrays from your home, work, and car.  On a card, write down your reasons for quitting. Carry the card with you and read it when you get the urge to smoke.  Cleanse your body of nicotine. Drink enough water and fluids to keep your urine clear or pale yellow. Do this after quitting to flush the nicotine from your  body.  Learn to predict your moods. Do not let a bad situation be your excuse to have a cigarette. Some situations in your life might tempt you into wanting a cigarette.  Never have "just one" cigarette. It leads to wanting another and another. Remind yourself of your decision to quit.  Change habits associated with smoking. If you smoked while driving or when feeling stressed, try other activities to replace smoking. Stand up when drinking your coffee. Brush your teeth after eating. Sit in a  different chair when you read the paper. Avoid alcohol while trying to quit, and try to drink fewer caffeinated beverages. Alcohol and caffeine may urge you to smoke.  Avoid foods and drinks that can trigger a desire to smoke, such as sugary or spicy foods and alcohol.  Ask people who smoke not to smoke around you.  Have something planned to do right after eating or having a cup of coffee. For example, plan to take a walk or exercise.  Try a relaxation exercise to calm you down and decrease your stress. Remember, you may be tense and nervous for the first 2 weeks after you quit, but this will pass.  Find new activities to keep your hands busy. Play with a pen, coin, or rubber band. Doodle or draw things on paper.  Brush your teeth right after eating. This will help cut down on the craving for the taste of tobacco after meals. You can also try mouthwash.   Use oral substitutes in place of cigarettes. Try using lemon drops, carrots, cinnamon sticks, or chewing gum. Keep them handy so they are available when you have the urge to smoke.  When you have the urge to smoke, try deep breathing.  Designate your home as a nonsmoking area.  If you are a heavy smoker, ask your health care provider about a prescription for nicotine chewing gum. It can ease your withdrawal from nicotine.  Reward yourself. Set aside the cigarette money you save and buy yourself something nice.  Look for support from others. Join a support group or smoking cessation program. Ask someone at home or at work to help you with your plan to quit smoking.  Always ask yourself, "Do I need this cigarette or is this just a reflex?" Tell yourself, "Today, I choose not to smoke," or "I do not want to smoke." You are reminding yourself of your decision to quit.  Do not replace cigarette smoking with electronic cigarettes (commonly called e-cigarettes). The safety of e-cigarettes is unknown, and some may contain harmful  chemicals.  If you relapse, do not give up! Plan ahead and think about what you will do the next time you get the urge to smoke. HOW WILL I FEEL WHEN I QUIT SMOKING? You may have symptoms of withdrawal because your body is used to nicotine (the addictive substance in cigarettes). You may crave cigarettes, be irritable, feel very hungry, cough often, get headaches, or have difficulty concentrating. The withdrawal symptoms are only temporary. They are strongest when you first quit but will go away within 10-14 days. When withdrawal symptoms occur, stay in control. Think about your reasons for quitting. Remind yourself that these are signs that your body is healing and getting used to being without cigarettes. Remember that withdrawal symptoms are easier to treat than the major diseases that smoking can cause.  Even after the withdrawal is over, expect periodic urges to smoke. However, these cravings are generally short lived and will go away whether you  smoke or not. Do not smoke! WHAT RESOURCES ARE AVAILABLE TO HELP ME QUIT SMOKING? Your health care provider can direct you to community resources or hospitals for support, which may include:  Group support.  Education.  Hypnosis.  Therapy.   This information is not intended to replace advice given to you by your health care provider. Make sure you discuss any questions you have with your health care provider.   Document Released: 04/22/2004 Document Revised: 08/15/2014 Document Reviewed: 01/10/2013 Elsevier Interactive Patient Education [employer].  This information is not intended to replace advice given to you by your health care provider. Make sure you discuss any questions you have with your health care provider.   Document Released: 01/14/2002 Document Revised: 10/17/2011 Document Reviewed: 02/24/2015 Elsevier Interactive Patient Education [employer].

## 2015-12-29 ENCOUNTER — Telehealth: Payer: Self-pay | Admitting: Family Medicine

## 2015-12-29 MED ORDER — AMPHETAMINE-DEXTROAMPHETAMINE 15 MG PO TABS: 15.0000 mg | ORAL_TABLET | Freq: Two times a day (BID) | ORAL | Status: DC

## 2015-12-29 NOTE — Telephone Encounter (Signed)
Pt is aware of results. 

## 2015-12-29 NOTE — Telephone Encounter (Signed)
Refill OK

## 2015-12-29 NOTE — Telephone Encounter (Signed)
Printed for signature

## 2015-12-29 NOTE — Telephone Encounter (Signed)
Wrong message noted prior. She is aware that RX is up front for pick up.

## 2015-12-29 NOTE — Telephone Encounter (Signed)
Last OV 12/25/15 Last refill 09/09/2015 #60 Please advise

## 2015-12-29 NOTE — Telephone Encounter (Signed)
Pt need new Rx for Adderall °

## 2016-02-10 ENCOUNTER — Encounter: Payer: Self-pay | Admitting: Family Medicine

## 2016-02-10 ENCOUNTER — Ambulatory Visit (INDEPENDENT_AMBULATORY_CARE_PROVIDER_SITE_OTHER): Payer: BLUE CROSS/BLUE SHIELD | Admitting: Family Medicine

## 2016-02-10 VITALS — BP 110/88 | HR 94 | Temp 98.6°F | Ht 66.0 in | Wt 176.1 lb

## 2016-02-10 DIAGNOSIS — M545 Low back pain, unspecified: Secondary | ICD-10-CM

## 2016-02-10 MED ORDER — AMPHETAMINE-DEXTROAMPHETAMINE 15 MG PO TABS: 15.0000 mg | ORAL_TABLET | Freq: Two times a day (BID) | ORAL | Status: DC

## 2016-02-10 MED ORDER — CYCLOBENZAPRINE HCL 10 MG PO TABS: 10.0000 mg | ORAL_TABLET | Freq: Every day | ORAL | Status: DC

## 2016-02-10 NOTE — Patient Instructions (Signed)
Low Back Sprain With Rehab A sprain is an injury in which a ligament is torn. The ligaments of the lower back are vulnerable to sprains. However, they are strong and require great force to be injured. These ligaments are important for stabilizing the spinal column. Sprains are classified into three categories. Grade 1 sprains cause pain, but the tendon is not lengthened. Grade 2 sprains include a lengthened ligament, due to the ligament being stretched or partially ruptured. With grade 2 sprains there is still function, although the function may be decreased. Grade 3 sprains involve a complete tear of the tendon or muscle, and function is usually impaired. SYMPTOMS   Severe pain in the lower back.  Sometimes, a feeling of a "pop," "snap," or tear, at the time of injury.  Tenderness and sometimes swelling at the injury site.  Uncommonly, bruising (contusion) within 48 hours of injury.  Muscle spasms in the back. CAUSES  Low back sprains occur when a force is placed on the ligaments that is greater than they can handle. Common causes of injury include:  Performing a stressful act while off-balance.  Repetitive stressful activities that involve movement of the lower back.  Direct hit (trauma) to the lower back. RISK INCREASES WITH:  Contact sports (football, wrestling).  Collisions (major skiing accidents).  Sports that require throwing or lifting (baseball, weightlifting).  Sports involving twisting of the spine (gymnastics, diving, tennis, golf).  Poor strength and flexibility.  Inadequate protection.  Previous back injury or surgery (especially fusion). PREVENTION  Wear properly fitted and padded protective equipment.  Warm up and stretch properly before activity.  Allow for adequate recovery between workouts.  Maintain physical fitness:  Strength, flexibility, and endurance.  Cardiovascular fitness.  Maintain a healthy body weight. PROGNOSIS  If treated properly,  low back sprains usually heal with non-surgical treatment. The length of time for healing depends on the severity of the injury.  RELATED COMPLICATIONS   Recurring symptoms, resulting in a chronic problem.  Chronic inflammation and pain in the low back.  Delayed healing or resolution of symptoms, especially if activity is resumed too soon.  Prolonged impairment.  Unstable or arthritic joints of the low back. TREATMENT  Treatment first involves the use of ice and medicine, to reduce pain and inflammation. The use of strengthening and stretching exercises may help reduce pain with activity. These exercises may be performed at home or with a therapist. Severe injuries may require referral to a therapist for further evaluation and treatment, such as ultrasound. Your caregiver may advise that you wear a back brace or corset, to help reduce pain and discomfort. Often, prolonged bed rest results in greater harm then benefit. Corticosteroid injections may be recommended. However, these should be reserved for the most serious cases. It is important to avoid using your back when lifting objects. At night, sleep on your back on a firm mattress, with a pillow placed under your knees. If non-surgical treatment is unsuccessful, surgery may be needed.  MEDICATION   If pain medicine is needed, nonsteroidal anti-inflammatory medicines (aspirin and ibuprofen), or other minor pain relievers (acetaminophen), are often advised.  Do not take pain medicine for 7 days before surgery.  Prescription pain relievers may be given, if your caregiver thinks they are needed. Use only as directed and only as much as you need.  Ointments applied to the skin may be helpful.  Corticosteroid injections may be given by your caregiver. These injections should be reserved for the most serious cases, because   they may only be given a certain number of times. HEAT AND COLD  Cold treatment (icing) should be applied for 10 to 15  minutes every 2 to 3 hours for inflammation and pain, and immediately after activity that aggravates your symptoms. Use ice packs or an ice massage.  Heat treatment may be used before performing stretching and strengthening activities prescribed by your caregiver, physical therapist, or athletic trainer. Use a heat pack or a warm water soak. SEEK MEDICAL CARE IF:   Symptoms get worse or do not improve in 2 to 4 weeks, despite treatment.  You develop numbness or weakness in either leg.  You lose bowel or bladder function.  Any of the following occur after surgery: fever, increased pain, swelling, redness, drainage of fluids, or bleeding in the affected area.  New, unexplained symptoms develop. (Drugs used in treatment may produce side effects.) EXERCISES  RANGE OF MOTION (ROM) AND STRETCHING EXERCISES - Low Back Sprain Most people with lower back pain will find that their symptoms get worse with excessive bending forward (flexion) or arching at the lower back (extension). The exercises that will help resolve your symptoms will focus on the opposite motion.  Your physician, physical therapist or athletic trainer will help you determine which exercises will be most helpful to resolve your lower back pain. Do not complete any exercises without first consulting with your caregiver. Discontinue any exercises which make your symptoms worse, until you speak to your caregiver. If you have pain, numbness or tingling which travels down into your buttocks, leg or foot, the goal of the therapy is for these symptoms to move closer to your back and eventually resolve. Sometimes, these leg symptoms will get better, but your lower back pain may worsen. This is often an indication of progress in your rehabilitation. Be very alert to any changes in your symptoms and the activities in which you participated in the 24 hours prior to the change. Sharing this information with your caregiver will allow him or her to most  efficiently treat your condition. These exercises may help you when beginning to rehabilitate your injury. Your symptoms may resolve with or without further involvement from your physician, physical therapist or athletic trainer. While completing these exercises, remember:   Restoring tissue flexibility helps normal motion to return to the joints. This allows healthier, less painful movement and activity.  An effective stretch should be held for at least 30 seconds.  A stretch should never be painful. You should only feel a gentle lengthening or release in the stretched tissue. FLEXION RANGE OF MOTION AND STRETCHING EXERCISES: STRETCH - Flexion, Single Knee to Chest   Lie on a firm bed or floor with both legs extended in front of you.  Keeping one leg in contact with the floor, bring your opposite knee to your chest. Hold your leg in place by either grabbing behind your thigh or at your knee.  Pull until you feel a gentle stretch in your low back. Hold __________ seconds.  Slowly release your grasp and repeat the exercise with the opposite side. Repeat __________ times. Complete this exercise __________ times per day.  STRETCH - Flexion, Double Knee to Chest  Lie on a firm bed or floor with both legs extended in front of you.  Keeping one leg in contact with the floor, bring your opposite knee to your chest.  Tense your stomach muscles to support your back and then lift your other knee to your chest. Hold your legs in   place by either grabbing behind your thighs or at your knees.  Pull both knees toward your chest until you feel a gentle stretch in your low back. Hold __________ seconds.  Tense your stomach muscles and slowly return one leg at a time to the floor. Repeat __________ times. Complete this exercise __________ times per day.  STRETCH - Low Trunk Rotation  Lie on a firm bed or floor. Keeping your legs in front of you, bend your knees so they are both pointed toward the  ceiling and your feet are flat on the floor.  Extend your arms out to the side. This will stabilize your upper body by keeping your shoulders in contact with the floor.  Gently and slowly drop both knees together to one side until you feel a gentle stretch in your low back. Hold for __________ seconds.  Tense your stomach muscles to support your lower back as you bring your knees back to the starting position. Repeat the exercise to the other side. Repeat __________ times. Complete this exercise __________ times per day  EXTENSION RANGE OF MOTION AND FLEXIBILITY EXERCISES: STRETCH - Extension, Prone on Elbows   Lie on your stomach on the floor, a bed will be too soft. Place your palms about shoulder width apart and at the height of your head.  Place your elbows under your shoulders. If this is too painful, stack pillows under your chest.  Allow your body to relax so that your hips drop lower and make contact more completely with the floor.  Hold this position for __________ seconds.  Slowly return to lying flat on the floor. Repeat __________ times. Complete this exercise __________ times per day.  RANGE OF MOTION - Extension, Prone Press Ups  Lie on your stomach on the floor, a bed will be too soft. Place your palms about shoulder width apart and at the height of your head.  Keeping your back as relaxed as possible, slowly straighten your elbows while keeping your hips on the floor. You may adjust the placement of your hands to maximize your comfort. As you gain motion, your hands will come more underneath your shoulders.  Hold this position __________ seconds.  Slowly return to lying flat on the floor. Repeat __________ times. Complete this exercise __________ times per day.  RANGE OF MOTION- Quadruped, Neutral Spine   Assume a hands and knees position on a firm surface. Keep your hands under your shoulders and your knees under your hips. You may place padding under your knees for  comfort.  Drop your head and point your tailbone toward the ground below you. This will round out your lower back like an angry cat. Hold this position for __________ seconds.  Slowly lift your head and release your tail bone so that your back sags into a large arch, like an old horse.  Hold this position for __________ seconds.  Repeat this until you feel limber in your low back.  Now, find your "sweet spot." This will be the most comfortable position somewhere between the two previous positions. This is your neutral spine. Once you have found this position, tense your stomach muscles to support your low back.  Hold this position for __________ seconds. Repeat __________ times. Complete this exercise __________ times per day.  STRENGTHENING EXERCISES - Low Back Sprain These exercises may help you when beginning to rehabilitate your injury. These exercises should be done near your "sweet spot." This is the neutral, low-back arch, somewhere between fully rounded and   fully arched, that is your least painful position. When performed in this safe range of motion, these exercises can be used for people who have either a flexion or extension based injury. These exercises may resolve your symptoms with or without further involvement from your physician, physical therapist or athletic trainer. While completing these exercises, remember:   Muscles can gain both the endurance and the strength needed for everyday activities through controlled exercises.  Complete these exercises as instructed by your physician, physical therapist or athletic trainer. Increase the resistance and repetitions only as guided.  You may experience muscle soreness or fatigue, but the pain or discomfort you are trying to eliminate should never worsen during these exercises. If this pain does worsen, stop and make certain you are following the directions exactly. If the pain is still present after adjustments, discontinue the  exercise until you can discuss the trouble with your caregiver. STRENGTHENING - Deep Abdominals, Pelvic Tilt   Lie on a firm bed or floor. Keeping your legs in front of you, bend your knees so they are both pointed toward the ceiling and your feet are flat on the floor.  Tense your lower abdominal muscles to press your low back into the floor. This motion will rotate your pelvis so that your tail bone is scooping upwards rather than pointing at your feet or into the floor. With a gentle tension and even breathing, hold this position for __________ seconds. Repeat __________ times. Complete this exercise __________ times per day.  STRENGTHENING - Abdominals, Crunches   Lie on a firm bed or floor. Keeping your legs in front of you, bend your knees so they are both pointed toward the ceiling and your feet are flat on the floor. Cross your arms over your chest.  Slightly tip your chin down without bending your neck.  Tense your abdominals and slowly lift your trunk high enough to just clear your shoulder blades. Lifting higher can put excessive stress on the lower back and does not further strengthen your abdominal muscles.  Control your return to the starting position. Repeat __________ times. Complete this exercise __________ times per day.  STRENGTHENING - Quadruped, Opposite UE/LE Lift   Assume a hands and knees position on a firm surface. Keep your hands under your shoulders and your knees under your hips. You may place padding under your knees for comfort.  Find your neutral spine and gently tense your abdominal muscles so that you can maintain this position. Your shoulders and hips should form a rectangle that is parallel with the floor and is not twisted.  Keeping your trunk steady, lift your right hand no higher than your shoulder and then your left leg no higher than your hip. Make sure you are not holding your breath. Hold this position for __________ seconds.  Continuing to keep  your abdominal muscles tense and your back steady, slowly return to your starting position. Repeat with the opposite arm and leg. Repeat __________ times. Complete this exercise __________ times per day.  STRENGTHENING - Abdominals and Quadriceps, Straight Leg Raise   Lie on a firm bed or floor with both legs extended in front of you.  Keeping one leg in contact with the floor, bend the other knee so that your foot can rest flat on the floor.  Find your neutral spine, and tense your abdominal muscles to maintain your spinal position throughout the exercise.  Slowly lift your straight leg off the floor about 6 inches for a count of   15, making sure to not hold your breath.  Still keeping your neutral spine, slowly lower your leg all the way to the floor. Repeat this exercise with each leg __________ times. Complete this exercise __________ times per day. POSTURE AND BODY MECHANICS CONSIDERATIONS - Low Back Sprain Keeping correct posture when sitting, standing or completing your activities will reduce the stress put on different body tissues, allowing injured tissues a chance to heal and limiting painful experiences. The following are general guidelines for improved posture. Your physician or physical therapist will provide you with any instructions specific to your needs. While reading these guidelines, remember:  The exercises prescribed by your provider will help you have the flexibility and strength to maintain correct postures.  The correct posture provides the best environment for your joints to work. All of your joints have less wear and tear when properly supported by a spine with good posture. This means you will experience a healthier, less painful body.  Correct posture must be practiced with all of your activities, especially prolonged sitting and standing. Correct posture is as important when doing repetitive low-stress activities (typing) as it is when doing a single heavy-load  activity (lifting). RESTING POSITIONS Consider which positions are most painful for you when choosing a resting position. If you have pain with flexion-based activities (sitting, bending, stooping, squatting), choose a position that allows you to rest in a less flexed posture. You would want to avoid curling into a fetal position on your side. If your pain worsens with extension-based activities (prolonged standing, working overhead), avoid resting in an extended position such as sleeping on your stomach. Most people will find more comfort when they rest with their spine in a more neutral position, neither too rounded nor too arched. Lying on a non-sagging bed on your side with a pillow between your knees, or on your back with a pillow under your knees will often provide some relief. Keep in mind, being in any one position for a prolonged period of time, no matter how correct your posture, can still lead to stiffness. PROPER SITTING POSTURE In order to minimize stress and discomfort on your spine, you must sit with correct posture. Sitting with good posture should be effortless for a healthy body. Returning to good posture is a gradual process. Many people can work toward this most comfortably by using various supports until they have the flexibility and strength to maintain this posture on their own. When sitting with proper posture, your ears will fall over your shoulders and your shoulders will fall over your hips. You should use the back of the chair to support your upper back. Your lower back will be in a neutral position, just slightly arched. You may place a small pillow or folded towel at the base of your lower back for  support.  When working at a desk, create an environment that supports good, upright posture. Without extra support, muscles tire, which leads to excessive strain on joints and other tissues. Keep these recommendations in mind: CHAIR:  A chair should be able to slide under your desk  when your back makes contact with the back of the chair. This allows you to work closely.  The chair's height should allow your eyes to be level with the upper part of your monitor and your hands to be slightly lower than your elbows. BODY POSITION  Your feet should make contact with the floor. If this is not possible, use a foot rest.  Keep your ears   over your shoulders. This will reduce stress on your neck and low back. INCORRECT SITTING POSTURES  If you are feeling tired and unable to assume a healthy sitting posture, do not slouch or slump. This puts excessive strain on your back tissues, causing more damage and pain. Healthier options include:  Using more support, like a lumbar pillow.  Switching tasks to something that requires you to be upright or walking.  Talking a brief walk.  Lying down to rest in a neutral-spine position. PROLONGED STANDING WHILE SLIGHTLY LEANING FORWARD  When completing a task that requires you to lean forward while standing in one place for a long time, place either foot up on a stationary 2-4 inch high object to help maintain the best posture. When both feet are on the ground, the lower back tends to lose its slight inward curve. If this curve flattens (or becomes too large), then the back and your other joints will experience too much stress, tire more quickly, and can cause pain. CORRECT STANDING POSTURES Proper standing posture should be assumed with all daily activities, even if they only take a few moments, like when brushing your teeth. As in sitting, your ears should fall over your shoulders and your shoulders should fall over your hips. You should keep a slight tension in your abdominal muscles to brace your spine. Your tailbone should point down to the ground, not behind your body, resulting in an over-extended swayback posture.  INCORRECT STANDING POSTURES  Common incorrect standing postures include a forward head, locked knees and/or an excessive  swayback. WALKING Walk with an upright posture. Your ears, shoulders and hips should all line-up. PROLONGED ACTIVITY IN A FLEXED POSITION When completing a task that requires you to bend forward at your waist or lean over a low surface, try to find a way to stabilize 3 out of 4 of your limbs. You can place a hand or elbow on your thigh or rest a knee on the surface you are reaching across. This will provide you more stability, so that your muscles do not tire as quickly. By keeping your knees relaxed, or slightly bent, you will also reduce stress across your lower back. CORRECT LIFTING TECHNIQUES DO :  Assume a wide stance. This will provide you more stability and the opportunity to get as close as possible to the object which you are lifting.  Tense your abdominals to brace your spine. Bend at the knees and hips. Keeping your back locked in a neutral-spine position, lift using your leg muscles. Lift with your legs, keeping your back straight.  Test the weight of unknown objects before attempting to lift them.  Try to keep your elbows locked down at your sides in order get the best strength from your shoulders when carrying an object.  Always ask for help when lifting heavy or awkward objects. INCORRECT LIFTING TECHNIQUES DO NOT:   Lock your knees when lifting, even if it is a small object.  Bend and twist. Pivot at your feet or move your feet when needing to change directions.  Assume that you can safely pick up even a paperclip without proper posture.   This information is not intended to replace advice given to you by your health care provider. Make sure you discuss any questions you have with your health care provider.   Document Released: 07/25/2005 Document Revised: 08/15/2014 Document Reviewed: 11/06/2008 [employer] Patient Education 2016 [employer]  

## 2016-02-10 NOTE — Progress Notes (Signed)
   Subjective:    Patient ID: Kelly Gilbert, female    DOB: 1961/06/15, 55 y.o.   MRN: [id number]  HPI Patient seen with low back pain. Onset 5 days ago. She was twisting/turning in a chair and noticed onset. Location is left lower lumbar. No radiculopathy symptoms. Worse with change of positions. 8 out of 10 severity at its worst Increased night pain past few nights. Ibuprofen helps. Icing did not help. History of similar back pain in the past. She actually has already scheduled follow-up with orthopedist later this month to assess. Pain also exacerbated with flexion. No weakness or numbness. No urine or stool incontinence.  Past Medical History  Diagnosis Date  . DEPRESSION 09/23/2009  . Migraine headache   . Lesion of bladder   . PONV (postoperative nausea and vomiting)    Past Surgical History  Procedure Laterality Date  . Benign left breaset bx  1992  . Cystoscopy with biopsy  01/16/2012    Procedure: CYSTOSCOPY WITH BIOPSY;  Surgeon: Molli Hazard, MD;  Location: [facility];  Service: Urology;  Laterality: N/A;  1 hour requested for this case  C-ARM CAMERA    . Vaginal hysterectomy  1999    EPIDURAL ANES.    reports that she has been smoking Cigarettes.  She has a 8.75 pack-year smoking history. She has never used smokeless tobacco. She reports that she drinks about 1.2 oz of alcohol per week. Her drug history is not on file. family history includes Cancer in her father; Cancer (age of onset: 37) in her mother; Hypertension in her father and maternal grandmother. There is no history of Colon cancer, Pancreatic cancer, or Stomach cancer. Allergies  Allergen Reactions  . Codeine Sulfate Nausea And Vomiting  . Sulfa Antibiotics Hives and Swelling      Review of Systems  Constitutional: Negative for fever, chills, appetite change and unexpected weight change.  Respiratory: Negative for shortness of breath.   Cardiovascular: Negative for chest  pain.  Gastrointestinal: Negative for abdominal pain.  Genitourinary: Negative for dysuria.  Musculoskeletal: Positive for back pain.  Neurological: Negative for weakness and numbness.       Objective:   Physical Exam  Constitutional: She appears well-developed and well-nourished. No distress.  Cardiovascular: Normal rate and regular rhythm.   Pulmonary/Chest: Effort normal and breath sounds normal. No respiratory distress. She has no wheezes. She has no rales.  Musculoskeletal: She exhibits no edema.  Straight leg raise are negative bilaterally. No lower extremity edema. Patient has pain with flexing her back more than about 30 No spinal tenderness  Neurological:  2+ reflexes knee and ankle bilaterally. Full-strength throughout lower extremity.          Assessment & Plan:  Acute left lumbar back pain. No radiculopathy symptoms. Suspect muscular. Try heat for symptomatic relief. Recommend stretches with handout given. Short-term trial of Flexeril 10 mg daily at bedtime. Continue ibuprofen as needed. She already has appointment scheduled with orthopedics for later this month and will keep that if pain persists  Eulas Post MD [facility]

## 2016-02-10 NOTE — Progress Notes (Signed)
Pre visit review using our clinic review tool, if applicable. No additional management support is needed unless otherwise documented below in the visit note. 

## 2016-07-11 ENCOUNTER — Ambulatory Visit (INDEPENDENT_AMBULATORY_CARE_PROVIDER_SITE_OTHER): Payer: 59 | Admitting: Family Medicine

## 2016-07-11 VITALS — BP 110/70 | HR 88 | Temp 98.2°F | Ht 66.0 in | Wt 176.0 lb

## 2016-07-11 DIAGNOSIS — Z23 Encounter for immunization: Secondary | ICD-10-CM

## 2016-07-11 DIAGNOSIS — R42 Dizziness and giddiness: Secondary | ICD-10-CM

## 2016-07-11 NOTE — Patient Instructions (Signed)
Vertigo Vertigo is the feeling that you or your surroundings are moving when they are not. Vertigo can be dangerous if it occurs while you are doing something that could endanger you or others, such as driving. What are the causes? This condition is caused by a disturbance in the signals that are sent by your body's sensory systems to your brain. Different causes of a disturbance can lead to vertigo, including:  Infections, especially in the inner ear.  A bad reaction to a drug, or misuse of alcohol and medicines.  Withdrawal from drugs or alcohol.  Quickly changing positions, as when lying down or rolling over in bed.  Migraine headaches.  Decreased blood flow to the brain.  Decreased blood pressure.  Increased pressure in the brain from a head or neck injury, stroke, infection, tumor, or bleeding.  Central nervous system disorders.  What are the signs or symptoms? Symptoms of this condition usually occur when you move your head or your eyes in different directions. Symptoms may start suddenly, and they usually last for less than a minute. Symptoms may include:  Loss of balance and falling.  Feeling like you are spinning or moving.  Feeling like your surroundings are spinning or moving.  Nausea and vomiting.  Blurred vision or double vision.  Difficulty hearing.  Slurred speech.  Dizziness.  Involuntary eye movement (nystagmus).  Symptoms can be mild and cause only slight annoyance, or they can be severe and interfere with daily life. Episodes of vertigo may return (recur) over time, and they are often triggered by certain movements. Symptoms may improve over time. How is this diagnosed? This condition may be diagnosed based on medical history and the quality of your nystagmus. Your health care provider may test your eye movements by asking you to quickly change positions to trigger the nystagmus. This may be called the Dix-Hallpike test, head thrust test, or roll test.  You may be referred to a health care provider who specializes in ear, nose, and throat (ENT) problems (otolaryngologist) or a provider who specializes in disorders of the central nervous system (neurologist). You may have additional testing, including:  A physical exam.  Blood tests.  MRI.  A CT scan.  An electrocardiogram (ECG). This records electrical activity in your heart.  An electroencephalogram (EEG). This records electrical activity in your brain.  Hearing tests.  How is this treated? Treatment for this condition depends on the cause and the severity of the symptoms. Treatment options include:  Medicines to treat nausea or vertigo. These are usually used for severe cases. Some medicines that are used to treat other conditions may also reduce or eliminate vertigo symptoms. These include: ? Medicines that control allergies (antihistamines). ? Medicines that control seizures (anticonvulsants). ? Medicines that relieve depression (antidepressants). ? Medicines that relieve anxiety (sedatives).  Head movements to adjust your inner ear back to normal. If your vertigo is caused by an ear problem, your health care provider may recommend certain movements to correct the problem.  Surgery. This is rare.  Follow these instructions at home: Safety  Move slowly.Avoid sudden body or head movements.  Avoid driving.  Avoid operating heavy machinery.  Avoid doing any tasks that would cause danger to you or others if you would have a vertigo episode during the task.  If you have trouble walking or keeping your balance, try using a cane for stability. If you feel dizzy or unstable, sit down right away.  Return to your normal activities as told by your   health care provider. Ask your health care provider what activities are safe for you. General instructions  Take over-the-counter and prescription medicines only as told by your health care provider.  Avoid certain positions or  movements as told by your health care provider.  Drink enough fluid to keep your urine clear or pale yellow.  Keep all follow-up visits as told by your health care provider. This is important. Contact a health care provider if:  Your medicines do not relieve your vertigo or they make it worse.  You have a fever.  Your condition gets worse or you develop new symptoms.  Your family or friends notice any behavioral changes.  Your nausea or vomiting gets worse.  You have numbness or a "pins and needles" sensation in part of your body. Get help right away if:  You have difficulty moving or speaking.  You are always dizzy.  You faint.  You develop severe headaches.  You have weakness in your hands, arms, or legs.  You have changes in your hearing or vision.  You develop a stiff neck.  You develop sensitivity to light. This information is not intended to replace advice given to you by your health care provider. Make sure you discuss any questions you have with your health care provider. Document Released: 05/04/2005 Document Revised: 01/06/2016 Document Reviewed: 11/17/2014 Elsevier Interactive Patient Education  2017 [employer]  

## 2016-07-11 NOTE — Progress Notes (Signed)
Pre visit review using our clinic review tool, if applicable. No additional management support is needed unless otherwise documented below in the visit note. 

## 2016-07-11 NOTE — Progress Notes (Signed)
Subjective:     Patient ID: Kelly Gilbert, female   DOB: 20-Oct-1960, 55 y.o.   MRN: [id number]  HPI Patient seen for acute visit. This past Monday one week ago today she had transient vertigo which occurred while she was in the shower. Her symptoms only lasted apparently minutes. She did not have any severe headache,  visual changes, slurred speech, dysphagia, focal weakness, or any confusion. She's had no episodes since then. She denies any prior history of vertigo. She did recall some nausea during the episode but no vomiting. She was not aware of any clear triggers other than movement in general.  She's been battling some low back pain and has seen multiple orthopedic specialists and has had MRI apparently had epidural type injection without much improvement. She's been referred to back specialist at [facility]. Denies recent fevers or chills. No night sweats. No appetite or weight changes.  Past Medical History:  Diagnosis Date  . DEPRESSION 09/23/2009  . Lesion of bladder   . Migraine headache   . PONV (postoperative nausea and vomiting)    Past Surgical History:  Procedure Laterality Date  . BENIGN LEFT BREASET BX  1992  . CYSTOSCOPY WITH BIOPSY  01/16/2012   Procedure: CYSTOSCOPY WITH BIOPSY;  Surgeon: Molli Hazard, MD;  Location: [facility];  Service: Urology;  Laterality: N/A;  1 hour requested for this case  C-ARM CAMERA    . VAGINAL HYSTERECTOMY  1999   EPIDURAL ANES.    reports that she has been smoking Cigarettes.  She has a 8.75 pack-year smoking history. She has never used smokeless tobacco. She reports that she drinks about 1.2 oz of alcohol per week . Her drug history is not on file. family history includes Cancer in her father; Cancer (age of onset: 65) in her mother; Hypertension in her father and maternal grandmother. Allergies  Allergen Reactions  . Codeine Sulfate Nausea And Vomiting  . Sulfa Antibiotics Hives and Swelling      Review of Systems  Constitutional: Negative for fatigue.  Eyes: Negative for visual disturbance.  Respiratory: Negative for cough, chest tightness, shortness of breath and wheezing.   Cardiovascular: Negative for chest pain, palpitations and leg swelling.  Genitourinary: Negative for dysuria.  Neurological: Positive for dizziness (See history of present illness). Negative for seizures, syncope, weakness, light-headedness and headaches.       Objective:   Physical Exam  Constitutional: She is oriented to person, place, and time. She appears well-developed and well-nourished.  Eyes: Pupils are equal, round, and reactive to light.  Neck: Neck supple. No JVD present. No thyromegaly present.  Cardiovascular: Normal rate and regular rhythm.  Exam reveals no gallop.   Pulmonary/Chest: Effort normal and breath sounds normal. No respiratory distress. She has no wheezes. She has no rales.  Musculoskeletal: She exhibits no edema.  Neurological: She is alert and oriented to person, place, and time. No cranial nerve deficit. Coordination normal.  No focal weakness. Finger to nose testing normal. Gait normal. Romberg normal       Assessment:     Transient episode of vertigo this past week. She's had no episodes since then and has nonfocal exam Her symptoms were very transient and she did not have any associated red flags for more worrisome vertigo    Plan:     -Follow-up for any recurrent vertigo -We reviewed signs and symptoms of more worrisome vertigo. Suspect she had benign peripheral positional vertigo.  Eulas Post MD  [facility]

## 2016-09-02 ENCOUNTER — Other Ambulatory Visit: Payer: Self-pay | Admitting: Family Medicine

## 2016-09-07 ENCOUNTER — Other Ambulatory Visit: Payer: Self-pay | Admitting: Family Medicine

## 2016-11-15 ENCOUNTER — Other Ambulatory Visit: Payer: Self-pay | Admitting: *Deleted

## 2016-11-15 MED ORDER — IMIPRAMINE HCL 50 MG PO TABS: ORAL_TABLET | ORAL | 1 refills | Status: DC

## 2016-12-19 ENCOUNTER — Ambulatory Visit (INDEPENDENT_AMBULATORY_CARE_PROVIDER_SITE_OTHER): Payer: 59 | Admitting: Family Medicine

## 2016-12-19 ENCOUNTER — Encounter: Payer: Self-pay | Admitting: Family Medicine

## 2016-12-19 VITALS — BP 110/84 | HR 79 | Temp 98.7°F | Wt 174.4 lb

## 2016-12-19 DIAGNOSIS — H8110 Benign paroxysmal vertigo, unspecified ear: Secondary | ICD-10-CM | POA: Diagnosis not present

## 2016-12-19 DIAGNOSIS — R079 Chest pain, unspecified: Secondary | ICD-10-CM | POA: Diagnosis not present

## 2016-12-19 DIAGNOSIS — R110 Nausea: Secondary | ICD-10-CM | POA: Diagnosis not present

## 2016-12-19 DIAGNOSIS — R42 Dizziness and giddiness: Secondary | ICD-10-CM | POA: Diagnosis not present

## 2016-12-19 NOTE — Patient Instructions (Signed)
Nonspecific Chest Pain Chest pain can be caused by many different conditions. There is always a chance that your pain could be related to something serious, such as a heart attack or a blood clot in your lungs. Chest pain can also be caused by conditions that are not life-threatening. If you have chest pain, it is very important to follow up with your health care provider. What are the causes? Causes of this condition include:  Heartburn.  Pneumonia or bronchitis.  Anxiety or stress.  Inflammation around your heart (pericarditis) or lung (pleuritis or pleurisy).  A blood clot in your lung.  A collapsed lung (pneumothorax). This can develop suddenly on its own (spontaneous pneumothorax) or from trauma to the chest.  Shingles infection (varicella-zoster virus).  Heart attack.  Damage to the bones, muscles, and cartilage that make up your chest wall. This can include: ? Bruised bones due to injury. ? Strained muscles or cartilage due to frequent or repeated coughing or overwork. ? Fracture to one or more ribs. ? Sore cartilage due to inflammation (costochondritis).  What increases the risk? Risk factors for this condition may include:  Activities that increase your risk for trauma or injury to your chest.  Respiratory infections or conditions that cause frequent coughing.  Medical conditions or overeating that can cause heartburn.  Heart disease or family history of heart disease.  Conditions or health behaviors that increase your risk of developing a blood clot.  Having had chicken pox (varicella zoster).  What are the signs or symptoms? Chest pain can feel like:  Burning or tingling on the surface of your chest or deep in your chest.  Crushing, pressure, aching, or squeezing pain.  Dull or sharp pain that is worse when you move, cough, or take a deep breath.  Pain that is also felt in your back, neck, shoulder, or arm, or pain that spreads to any of these  areas.  Your chest pain may come and go, or it may stay constant. How is this diagnosed? Lab tests or other studies may be needed to find the cause of your pain. Your health care provider may have you take a test called an ECG (electrocardiogram). An ECG records your heartbeat patterns at the time the test is performed. You may also have other tests, such as:  Transthoracic echocardiogram (TTE). In this test, sound waves are used to create a picture of the heart structures and to look at how blood flows through your heart.  Transesophageal echocardiogram (TEE).This is a more advanced imaging test that takes images from inside your body. It allows your health care provider to see your heart in finer detail.  Cardiac monitoring. This allows your health care provider to monitor your heart rate and rhythm in real time.  Holter monitor. This is a portable device that records your heartbeat and can help to diagnose abnormal heartbeats. It allows your health care provider to track your heart activity for several days, if needed.  Stress tests. These can be done through exercise or by taking medicine that makes your heart beat more quickly.  Blood tests.  Other imaging tests.  How is this treated? Treatment depends on what is causing your chest pain. Treatment may include:  Medicines. These may include: ? Acid blockers for heartburn. ? Anti-inflammatory medicine. ? Pain medicine for inflammatory conditions. ? Antibiotic medicine, if an infection is present. ? Medicines to dissolve blood clots. ? Medicines to treat coronary artery disease (CAD).  Supportive care for conditions that   This may include:  Resting.  Applying heat or cold packs to injured areas.  Limiting activities until pain decreases. Follow these instructions at home: Medicines   If you were prescribed an antibiotic, take it as told by your health care provider. Do not stop taking the antibiotic even if you  start to feel better.  Take over-the-counter and prescription medicines only as told by your health care provider. Lifestyle   Do not use any products that contain nicotine or tobacco, such as cigarettes and e-cigarettes. If you need help quitting, ask your health care provider.  Do not drink alcohol.  Make lifestyle changes as directed by your health care provider. These may include:  Getting regular exercise. Ask your health care provider to suggest some activities that are safe for you.  Eating a heart-healthy diet. A registered dietitian can help you to learn healthy eating options.  Maintaining a healthy weight.  Managing diabetes, if necessary.  Reducing stress, such as with yoga or relaxation techniques. General instructions   Avoid any activities that bring on chest pain.  If heartburn is the cause for your chest pain, raise (elevate) the head of your bed about 6 inches (15 cm) by putting blocks under the legs. Sleeping with more pillows does not effectively relieve heartburn because it only changes the position of your head.  Keep all follow-up visits as told by your health care provider. This is important. This includes any further testing if your chest pain does not go away. Contact a health care provider if:  Your chest pain does not go away.  You have a rash with blisters on your chest.  You have a fever.  You have chills. Get help right away if:  Your chest pain is worse.  You have a cough that gets worse, or you cough up blood.  You have severe pain in your abdomen.  You have severe weakness.  You faint.  You have sudden, unexplained chest discomfort.  You have sudden, unexplained discomfort in your arms, back, neck, or jaw.  You have shortness of breath at any time.  You suddenly start to sweat, or your skin gets clammy.  You feel nauseous or you vomit.  You suddenly feel light-headed or dizzy.  Your heart begins to beat quickly, or it feels  like it is skipping beats. These symptoms may represent a serious problem that is an emergency. Do not wait to see if the symptoms will go away. Get medical help right away. Call your local emergency services (911 in the [place] Do not drive yourself to the hospital. This information is not intended to replace advice given to you by your health care provider. Make sure you discuss any questions you have with your health care provider. Document Released: 05/04/2005 Document Revised: 04/18/2016 Document Reviewed: 04/18/2016 Elsevier Interactive Patient Education  2017 [employer] Inc[employer] Benign Positional Vertigo Vertigo is the feeling that you or your surroundings are moving when they are not. Benign positional vertigo is the most common form of vertigo. The cause of this condition is not serious (is benign). This condition is triggered by certain movements and positions (is positional). This condition can be dangerous if it occurs while you are doing something that could endanger you or others, such as driving. What are the causes? In many cases, the cause of this condition is not known. It may be caused by a disturbance in an area of the inner ear that helps your brain to sense movement and balance. This  disturbance can be caused by a viral infection (labyrinthitis), head injury, or repetitive motion. What increases the risk? This condition is more likely to develop in:  Women.  People who are 73 years of age or older. What are the signs or symptoms? Symptoms of this condition usually happen when you move your head or your eyes in different directions. Symptoms may start suddenly, and they usually last for less than a minute. Symptoms may include:  Loss of balance and falling.  Feeling like you are spinning or moving.  Feeling like your surroundings are spinning or moving.  Nausea and vomiting.  Blurred vision.  Dizziness.  Involuntary eye movement (nystagmus). Symptoms can be mild and  cause only slight annoyance, or they can be severe and interfere with daily life. Episodes of benign positional vertigo may return (recur) over time, and they may be triggered by certain movements. Symptoms may improve over time. How is this diagnosed? This condition is usually diagnosed by medical history and a physical exam of the head, neck, and ears. You may be referred to a health care provider who specializes in ear, nose, and throat (ENT) problems (otolaryngologist) or a provider who specializes in disorders of the nervous system (neurologist). You may have additional testing, including:  MRI.  A CT scan.  Eye movement tests. Your health care provider may ask you to change positions quickly while he or she watches you for symptoms of benign positional vertigo, such as nystagmus. Eye movement may be tested with an electronystagmogram (ENG), caloric stimulation, the Dix-Hallpike test, or the roll test.  An electroencephalogram (EEG). This records electrical activity in your brain.  Hearing tests. How is this treated? Usually, your health care provider will treat this by moving your head in specific positions to adjust your inner ear back to normal. Surgery may be needed in severe cases, but this is rare. In some cases, benign positional vertigo may resolve on its own in 2-4 weeks. Follow these instructions at home: Safety   Move slowly.Avoid sudden body or head movements.  Avoid driving.  Avoid operating heavy machinery.  Avoid doing any tasks that would be dangerous to you or others if a vertigo episode would occur.  If you have trouble walking or keeping your balance, try using a cane for stability. If you feel dizzy or unstable, sit down right away.  Return to your normal activities as told by your health care provider. Ask your health care provider what activities are safe for you. General instructions   Take over-the-counter and prescription medicines only as told by your  health care provider.  Avoid certain positions or movements as told by your health care provider.  Drink enough fluid to keep your urine clear or pale yellow.  Keep all follow-up visits as told by your health care provider. This is important. Contact a health care provider if:  You have a fever.  Your condition gets worse or you develop new symptoms.  Your family or friends notice any behavioral changes.  Your nausea or vomiting gets worse.  You have numbness or a "pins and needles" sensation. Get help right away if:  You have difficulty speaking or moving.  You are always dizzy.  You faint.  You develop severe headaches.  You have weakness in your legs or arms.  You have changes in your hearing or vision.  You develop a stiff neck.  You develop sensitivity to light. This information is not intended to replace advice given to you by  your health care provider. Make sure you discuss any questions you have with your health care provider. Document Released: 05/02/2006 Document Revised: 12/31/2015 Document Reviewed: 11/17/2014 Elsevier Interactive Patient Education  2017 [place] ahead and start Prilosec or Nexium over-the-counter one daily We will set up stress test to further assess Avoid heavy exertion until further assessed

## 2016-12-19 NOTE — Progress Notes (Signed)
Subjective:     Patient ID: Kelly Gilbert, female   DOB: 08/19/1960, 56 y.o.   MRN: [id number]  HPI Patient seen for the following issues.   First she had episode of vertigo on Friday night and then 1 recurrent episode yesterday but none since then. She had some associated nausea.  No hearing loss, speech change, ataxia, or focal weakness.  Symptoms worse with changing head position.  Symptoms improved today.   She mowed her lawn Friday with a push mower and had no symptoms whatsoever at that time. Friday afternoon she ate some pizza and Friday night noted onset of some mid substernal chest pressure which has been relatively continuous since then. She's had somewhat increased frequency of GERD symptoms in recent weeks. She has fairly continuous pressure sensation which sometimes waxes and wanes. Not brought on by activity. No pain with swallowing. No recent appetite or weight changes. No abdominal pain.  Family history is significant for paternal grandmother with possible CHF/?CAD. Neither parents has history of CAD. She has history of no diabetes and no history of hypertension. Does smoke about 5-10 cigarettes per day.  Past Medical History:  Diagnosis Date  . DEPRESSION 09/23/2009  . Lesion of bladder   . Migraine headache   . PONV (postoperative nausea and vomiting)    Past Surgical History:  Procedure Laterality Date  . BENIGN LEFT BREASET BX  1992  . CYSTOSCOPY WITH BIOPSY  01/16/2012   Procedure: CYSTOSCOPY WITH BIOPSY;  Surgeon: Molli Hazard, MD;  Location: [facility];  Service: Urology;  Laterality: N/A;  1 hour requested for this case  C-ARM CAMERA    . VAGINAL HYSTERECTOMY  1999   EPIDURAL ANES.    reports that she has been smoking Cigarettes.  She has a 8.75 pack-year smoking history. She has never used smokeless tobacco. She reports that she drinks about 1.2 oz of alcohol per week . Her drug history is not on file. family history includes Cancer  in her father; Cancer (age of onset: 35) in her mother; Hypertension in her father and maternal grandmother. Allergies  Allergen Reactions  . Codeine Sulfate Nausea And Vomiting  . Sulfa Antibiotics Hives and Swelling     Review of Systems  Constitutional: Negative for fatigue and unexpected weight change.  Eyes: Negative for visual disturbance.  Respiratory: Negative for cough, chest tightness, shortness of breath and wheezing.   Cardiovascular: Positive for chest pain. Negative for palpitations and leg swelling.  Endocrine: Negative for polydipsia and polyuria.  Neurological: Positive for dizziness. Negative for seizures, syncope, weakness, light-headedness and headaches.       Objective:   Physical Exam  Constitutional: She is oriented to person, place, and time. She appears well-developed and well-nourished.  Eyes: Pupils are equal, round, and reactive to light.  Neck: Neck supple. No JVD present. No thyromegaly present.  Cardiovascular: Normal rate and regular rhythm.  Exam reveals no gallop.   Pulmonary/Chest: Effort normal and breath sounds normal. No respiratory distress. She has no wheezes. She has no rales.  Musculoskeletal: She exhibits no edema.  Neurological: She is alert and oriented to person, place, and time. No cranial nerve deficit. Coordination normal.       Assessment:     #1 transient vertigo. Suspect benign peripheral positional vertigo. Symptoms currently absent  #2 atypical chest pain. Symptoms have not occurred with exertion and she has atypical symptoms in that they're fairly constant. She does have moderate risk factors. EKG today shows  sinus rhythm with no acute changes.  ?GERD related.    Plan:     -Encouraged to stop smoking -Set up nuclear stress test to further evaluate -Follow-up for any recurrent or persistent vertigo symptoms -recommend start OTC Prilosec or Nexium once daily for now.  Eulas Post MD [facility] at  [facility]

## 2016-12-27 ENCOUNTER — Telehealth (HOSPITAL_COMMUNITY): Payer: Self-pay

## 2016-12-27 NOTE — Telephone Encounter (Signed)
Encounter complete. 

## 2016-12-28 ENCOUNTER — Inpatient Hospital Stay (HOSPITAL_COMMUNITY): Admission: RE | Admit: 2016-12-28 | Payer: 59 | Source: Ambulatory Visit

## 2016-12-28 ENCOUNTER — Encounter (HOSPITAL_COMMUNITY): Payer: 59

## 2017-01-12 ENCOUNTER — Telehealth: Payer: Self-pay | Admitting: Family Medicine

## 2017-01-12 NOTE — Telephone Encounter (Signed)
PT insurance [facility] denied the request for pt to have a myocardial perfusion stress test . Called pt to informed she wanted to informed Dr Elease Hashimoto that her CP has subsided. And wanted to know if its possible that Dr Elease Hashimoto can do a appal  On the case please advise . FYI all clinical information from pt office visit was faxed  To [facility]

## 2017-01-13 NOTE — Telephone Encounter (Signed)
If her symptoms have fully resolved with PPI (Nexium or Omeprazole) and no exertional symptoms would observe for now.  If she has any recurrent chest pain whatsoever I can try to do peer to peer review.

## 2017-01-23 ENCOUNTER — Telehealth: Payer: Self-pay | Admitting: Family Medicine

## 2017-01-23 ENCOUNTER — Ambulatory Visit (INDEPENDENT_AMBULATORY_CARE_PROVIDER_SITE_OTHER): Payer: 59 | Admitting: Family Medicine

## 2017-01-23 VITALS — BP 124/86 | Temp 98.3°F | Wt 178.0 lb

## 2017-01-23 DIAGNOSIS — L255 Unspecified contact dermatitis due to plants, except food: Secondary | ICD-10-CM | POA: Diagnosis not present

## 2017-01-23 MED ORDER — TRIAMCINOLONE ACETONIDE 0.1 % EX CREA: 1.0000 "application " | TOPICAL_CREAM | Freq: Two times a day (BID) | CUTANEOUS | 0 refills | Status: DC

## 2017-01-23 NOTE — Patient Instructions (Signed)
Poison Ivy Dermatitis Poison ivy dermatitis is redness and soreness (inflammation) of the skin. It is caused by a chemical that is found on the leaves of the poison ivy plant. You may also have itching, a rash, and blisters. Symptoms often clear up in 1-2 weeks. You may get this condition by touching a poison ivy plant. You can also get it by touching something that has the chemical on it. This may include animals or objects that have come in contact with the plant. Follow these instructions at home: General instructions  Take or apply over-the-counter and prescription medicines only as told by your doctor.  If you touch poison ivy, wash your skin with soap and cold water right away.  Use hydrocortisone creams or calamine lotion as needed to help with itching.  Take oatmeal baths as needed. Use colloidal oatmeal. You can get this at a pharmacy or grocery store. Follow the instructions on the package.  Do not scratch or rub your skin.  While you have the rash, wash your clothes right after you wear them. Prevention  Know what poison ivy looks like so you can avoid it. This plant has three leaves with flowering branches on a single stem. The leaves are glossy. They have uneven edges that come to a point at the front.  If you have touched poison ivy, wash with soap and water right away. Be sure to wash under your fingernails.  When hiking or camping, wear long pants, a long-sleeved shirt, tall socks, and hiking boots. You can also use a lotion on your skin that helps to prevent contact with the chemical on the plant.  If you think that your clothes or outdoor gear came in contact with poison ivy, rinse them off with a garden hose before you bring them inside your house. Contact a doctor if:  You have open sores in the rash area.  You have more redness, swelling, or pain in the affected area.  You have redness that spreads beyond the rash area.  You have fluid, blood, or pus coming from  the affected area.  You have a fever.  You have a rash over a large area of your body.  You have a rash on your eyes, mouth, or genitals.  Your rash does not get better after a few days. Get help right away if:  Your face swells or your eyes swell shut.  You have trouble breathing.  You have trouble swallowing. This information is not intended to replace advice given to you by your health care provider. Make sure you discuss any questions you have with your health care provider. Document Released: 08/27/2010 Document Revised: 12/31/2015 Document Reviewed: 12/31/2014 Elsevier Interactive Patient Education  2018 [employer]  

## 2017-01-23 NOTE — Telephone Encounter (Signed)
Pt request refill  amphetamine-dextroamphetamine (ADDERALL) 15 MG tablet  Pt states she does not take everyday . Pt seeing dr Maudie Mercury today at 4:30 pm for poison ivy and wanted to pick up this afternoon if possible.

## 2017-01-23 NOTE — Telephone Encounter (Signed)
Refill OK

## 2017-01-23 NOTE — Telephone Encounter (Signed)
Last refill 02/10/16.  Last acute office visit 12/19/16.  Last physical 12/25/15.  Okay to fill?

## 2017-01-23 NOTE — Progress Notes (Signed)
HPI:  Acute visit for "poison ivy" -cleared poison ivy about 1.5 weeks ago and developed itchy papulovesicular rash on bother forearms -treated with doc on call with 1 week steroid taper -doing better, but still with some itchy lesions on arms and has black tie event this weekend -is treating with otc gritty scrub -no lesions on face, mucus membranes or other locations -no trouble breathing or swallowing  ROS: See pertinent positives and negatives per HPI.  Past Medical History:  Diagnosis Date  . DEPRESSION 09/23/2009  . Lesion of bladder   . Migraine headache   . PONV (postoperative nausea and vomiting)     Past Surgical History:  Procedure Laterality Date  . BENIGN LEFT BREASET BX  1992  . CYSTOSCOPY WITH BIOPSY  01/16/2012   Procedure: CYSTOSCOPY WITH BIOPSY;  Surgeon: Molli Hazard, MD;  Location: [facility];  Service: Urology;  Laterality: N/A;  1 hour requested for this case  C-ARM CAMERA    . VAGINAL HYSTERECTOMY  1999   EPIDURAL ANES.    Family History  Problem Relation Age of Onset  . Hypertension Father   . Cancer Father        prostate  . Hypertension Maternal Grandmother   . Cancer Mother 84       renal cell cancer  . Colon cancer Neg Hx   . Pancreatic cancer Neg Hx   . Stomach cancer Neg Hx     Social History   Social History  . Marital status: Married    Spouse name: N/A  . Number of children: N/A  . Years of education: N/A   Social History Main Topics  . Smoking status: Current Every Day Smoker    Packs/day: 0.25    Years: 35.00    Types: Cigarettes  . Smokeless tobacco: Never Used     Comment: QUIT SMOKING 2008--  RECENTLY STARTED BACK 7-8 CIG. PER DAY  . Alcohol use 1.2 oz/week    2 Shots of liquor per week  . Drug use: Unknown  . Sexual activity: Not on file   Other Topics Concern  . Not on file   Social History Narrative  . No narrative on file     Current Outpatient Prescriptions:  .   amphetamine-dextroamphetamine (ADDERALL) 15 MG tablet, Take 1 tablet by mouth 2 (two) times daily. May refill in one month, Disp: 60 tablet, Rfl: 0 .  amphetamine-dextroamphetamine (ADDERALL) 15 MG tablet, Take 1 tablet by mouth 2 (two) times daily. May refill in two months, Disp: 60 tablet, Rfl: 0 .  amphetamine-dextroamphetamine (ADDERALL) 15 MG tablet, Take 1 tablet by mouth 2 (two) times daily., Disp: 60 tablet, Rfl: 0 .  Aspirin-Acetaminophen-Caffeine (EXCEDRIN PO), Take by mouth. ON HOLD, Disp: , Rfl:  .  Biotin 5000 MCG CAPS, Take by mouth., Disp: , Rfl:  .  Diphenhydramine-Pseudoephed (BENADRYL ALLERGY/SINUS PO), Take by mouth., Disp: , Rfl:  .  estradiol (ESTRACE) 1 MG tablet, TAKE 1 TABLET (1 MG TOTAL) BY MOUTH DAILY., Disp: 30 tablet, Rfl: 5 .  imipramine (TOFRANIL) 50 MG tablet, TAKE 1 TABLET (50 MG TOTAL) BY MOUTH AT BEDTIME., Disp: 90 tablet, Rfl: 1 .  loratadine (CLARITIN) 10 MG tablet, Take 10 mg by mouth daily., Disp: , Rfl:  .  XIIDRA 5 % SOLN, , Disp: , Rfl:  .  triamcinolone cream (KENALOG) 0.1 %, Apply 1 application topically 2 (two) times daily. For 1-2 weeks., Disp: 45 g, Rfl: 0  EXAM:  Vitals:  01/23/17 1642  BP: 124/86  Temp: 98.3 F (36.8 C)    Body mass index is 28.73 kg/m.  GENERAL: vitals reviewed and listed above, alert, oriented, appears well hydrated and in no acute distress  HEENT: atraumatic, conjunttiva clear, no obvious abnormalities on inspection of external nose and ears  NECK: no obvious masses on inspection  SKIN: dry erythematous papules and excoriations on bilat forearms in patchy distribution  MS: moves all extremities without noticeable abnormality  PSYCH: pleasant and cooperative, no obvious depression or anxiety  ASSESSMENT AND PLAN:  Discussed the following assessment and plan:  Toxicodendron dermatitis  -looks to be healing -discussed options for tx and she decided to try zyrtec and topical steroid cream for 1-2  weeks -Patient advised to return or notify a doctor immediately if symptoms worsen or persist or new concerns arise.  Patient Instructions  Poison Ivy Dermatitis Poison ivy dermatitis is redness and soreness (inflammation) of the skin. It is caused by a chemical that is found on the leaves of the poison ivy plant. You may also have itching, a rash, and blisters. Symptoms often clear up in 1-2 weeks. You may get this condition by touching a poison ivy plant. You can also get it by touching something that has the chemical on it. This may include animals or objects that have come in contact with the plant. Follow these instructions at home: General instructions  Take or apply over-the-counter and prescription medicines only as told by your doctor.  If you touch poison ivy, wash your skin with soap and cold water right away.  Use hydrocortisone creams or calamine lotion as needed to help with itching.  Take oatmeal baths as needed. Use colloidal oatmeal. You can get this at a pharmacy or grocery store. Follow the instructions on the package.  Do not scratch or rub your skin.  While you have the rash, wash your clothes right after you wear them. Prevention  Know what poison ivy looks like so you can avoid it. This plant has three leaves with flowering branches on a single stem. The leaves are glossy. They have uneven edges that come to a point at the front.  If you have touched poison ivy, wash with soap and water right away. Be sure to wash under your fingernails.  When hiking or camping, wear long pants, a long-sleeved shirt, tall socks, and hiking boots. You can also use a lotion on your skin that helps to prevent contact with the chemical on the plant.  If you think that your clothes or outdoor gear came in contact with poison ivy, rinse them off with a garden hose before you bring them inside your house. Contact a doctor if:  You have open sores in the rash area.  You have more  redness, swelling, or pain in the affected area.  You have redness that spreads beyond the rash area.  You have fluid, blood, or pus coming from the affected area.  You have a fever.  You have a rash over a large area of your body.  You have a rash on your eyes, mouth, or genitals.  Your rash does not get better after a few days. Get help right away if:  Your face swells or your eyes swell shut.  You have trouble breathing.  You have trouble swallowing. This information is not intended to replace advice given to you by your health care provider. Make sure you discuss any questions you have with your health care provider.  Document Released: 08/27/2010 Document Revised: 12/31/2015 Document Reviewed: 12/31/2014 Elsevier Interactive Patient Education  2018 [place]., [place]

## 2017-01-24 MED ORDER — AMPHETAMINE-DEXTROAMPHETAMINE 15 MG PO TABS: 15.0000 mg | ORAL_TABLET | Freq: Two times a day (BID) | ORAL | 0 refills | Status: DC

## 2017-01-24 MED ORDER — AMPHETAMINE-DEXTROAMPHETAMINE 15 MG PO TABS
15.0000 mg | ORAL_TABLET | Freq: Two times a day (BID) | ORAL | 0 refills | Status: DC
Start: 2017-01-24 — End: 2018-03-20

## 2017-01-24 MED ORDER — AMPHETAMINE-DEXTROAMPHETAMINE 15 MG PO TABS
15.0000 mg | ORAL_TABLET | Freq: Two times a day (BID) | ORAL | 0 refills | Status: DC
Start: 2017-01-24 — End: 2023-07-28

## 2017-01-24 NOTE — Telephone Encounter (Signed)
Refill ready for pick up and patient is aware

## 2017-02-09 ENCOUNTER — Telehealth: Payer: Self-pay

## 2017-02-09 NOTE — Telephone Encounter (Signed)
Received PA request for Adderall. PA approved & form faxed back to pharmacy.

## 2017-03-05 ENCOUNTER — Other Ambulatory Visit: Payer: Self-pay | Admitting: Family Medicine

## 2017-04-08 ENCOUNTER — Other Ambulatory Visit: Payer: Self-pay | Admitting: Family Medicine

## 2017-04-27 ENCOUNTER — Encounter: Payer: Self-pay | Admitting: Family Medicine

## 2017-05-17 ENCOUNTER — Other Ambulatory Visit: Payer: Self-pay | Admitting: Family Medicine

## 2017-05-18 NOTE — Telephone Encounter (Signed)
Last rx given on 9/4 #30 wth no ref

## 2017-05-19 NOTE — Telephone Encounter (Signed)
Rx done. 

## 2017-05-19 NOTE — Telephone Encounter (Signed)
Refill for 6 months. 

## 2017-09-11 ENCOUNTER — Other Ambulatory Visit: Payer: Self-pay | Admitting: Family Medicine

## 2017-10-13 ENCOUNTER — Other Ambulatory Visit: Payer: Self-pay | Admitting: Family Medicine

## 2017-10-13 NOTE — Telephone Encounter (Signed)
Refill for 6 months. 

## 2017-11-19 ENCOUNTER — Other Ambulatory Visit: Payer: Self-pay | Admitting: Family Medicine

## 2018-01-25 ENCOUNTER — Encounter: Payer: Self-pay | Admitting: Family Medicine

## 2018-01-25 ENCOUNTER — Ambulatory Visit: Payer: 59 | Admitting: Family Medicine

## 2018-01-25 VITALS — BP 102/84 | HR 84 | Temp 98.3°F | Wt 165.0 lb

## 2018-01-25 DIAGNOSIS — R319 Hematuria, unspecified: Secondary | ICD-10-CM | POA: Diagnosis not present

## 2018-01-25 DIAGNOSIS — M545 Low back pain, unspecified: Secondary | ICD-10-CM

## 2018-01-25 LAB — POC URINALSYSI DIPSTICK (AUTOMATED)
Bilirubin, UA: NEGATIVE
Glucose, UA: NEGATIVE
Ketones, UA: NEGATIVE
Leukocytes, UA: NEGATIVE
NITRITE UA: NEGATIVE
PROTEIN UA: NEGATIVE
SPEC GRAV UA: 1.02 (ref 1.010–1.025)
UROBILINOGEN UA: 0.2 U/dL
pH, UA: 6 (ref 5.0–8.0)

## 2018-01-25 NOTE — Progress Notes (Signed)
Subjective:    Patient ID: Kelly Gilbert, female    DOB: Nov 30, 1960, 57 y.o.   MRN: [id number]  No chief complaint on file.   HPI Patient was seen today for acute concern.  Pt endorses blood in urine and back pain x1 week.  Pt endorses dark-colored urine since last week.  Pt is not drinking any water, may drink a propel.  Pt denies dysuria, frequency, abdominal pain, fever, N/V, back pain that moves, h/o kidney stones.  Pt has not tried anything for her symptoms.  Pt states she thinks her back pain is 2/2 moving.  Pt states they are moving into a "fixer upper".  She has been moving/lifting boxes since the beginning of the month.    Pt has a h/o hematuria.  Seen by urology in the past for a lesion on bladder.  Pt is a smoker.  H/o renal cyst per CT 11/03/2011.  Past Medical History:  Diagnosis Date  . DEPRESSION 09/23/2009  . Lesion of bladder   . Migraine headache   . PONV (postoperative nausea and vomiting)     Allergies  Allergen Reactions  . Codeine Sulfate Nausea And Vomiting  . Sulfa Antibiotics Hives and Swelling    ROS General: Denies fever, chills, night sweats, changes in weight, changes in appetite HEENT: Denies headaches, ear pain, changes in vision, rhinorrhea, sore throat CV: Denies CP, palpitations, SOB, orthopnea Pulm: Denies SOB, cough, wheezing GI: Denies abdominal pain, nausea, vomiting, diarrhea, constipation GU: Denies dysuria, hematuria, frequency, vaginal discharge  +hematuria Msk: Denies muscle cramps, joint pains  +back pain Neuro: Denies weakness, numbness, tingling Skin: Denies rashes, bruising Psych: Denies depression, anxiety, hallucinations     Objective:    Blood pressure 102/84, pulse 84, temperature 98.3 F (36.8 C), temperature source Oral, weight 165 lb (74.8 kg), SpO2 98 %.   Gen. Pleasant, well-nourished, in no distress, normal affect  HEENT: [place]/AT, face symmetric, no scleral icterus, PERRLA, nares patent without drainage Lungs: no  accessory muscle use, CTAB, no wheezes or rales Cardiovascular: RRR, no m/r/g, no peripheral edema Abdomen: BS present, soft, NT/ND. No suprapubic tenderness Musculoskeletal: No deformities, no cyanosis or clubbing, normal tone.  No CVA tenderness Neuro:  A&Ox3, CN II-XII intact, normal gait Skin:  Warm, no lesions/ rash   Wt Readings from Last 3 Encounters:  01/25/18 165 lb (74.8 kg)  01/23/17 178 lb (80.7 kg)  12/19/16 174 lb 6.4 oz (79.1 kg)    Lab Results  Component Value Date   WBC 6.3 12/18/2015   HGB 12.3 12/18/2015   HCT 36.9 12/18/2015   PLT 302.0 12/18/2015   GLUCOSE 96 12/18/2015   CHOL 206 (H) 12/18/2015   TRIG 94.0 12/18/2015   HDL 62.30 12/18/2015   LDLDIRECT 146.3 03/12/2013   LDLCALC 125 (H) 12/18/2015   ALT 11 12/18/2015   AST 12 12/18/2015   NA 139 12/18/2015   K 3.7 12/18/2015   CL 104 12/18/2015   CREATININE 0.69 12/18/2015   BUN 16 12/18/2015   CO2 28 12/18/2015   TSH 0.93 12/18/2015    Assessment/Plan:  Hematuria, unspecified type   -UA with 1+ RBCs, SG 1.020, dark brown color. -discussed possible causes.  Given h/o hematuria and smoking hx, pt advised to f/u with [facility].   -referral placed as last seen in 2013. - Plan: POCT Urinalysis Dipstick (Automated), Urine Culture, microscopic -RTC or ED for worsening symptoms, blood clots,  or pain that starts to move.  Acute b/l low back pain  without sciatica -supportive care--rest, heat, ice, Tylenol -discussed proper body mechanics F/u prn with pcp  Grier Mitts, MD

## 2018-01-27 LAB — URINE CULTURE
MICRO NUMBER:: 90739870
SPECIMEN QUALITY: ADEQUATE

## 2018-02-20 ENCOUNTER — Encounter: Payer: Self-pay | Admitting: Family Medicine

## 2018-02-20 ENCOUNTER — Ambulatory Visit: Payer: 59 | Admitting: Family Medicine

## 2018-02-20 ENCOUNTER — Other Ambulatory Visit: Payer: Self-pay | Admitting: Family Medicine

## 2018-02-20 VITALS — BP 108/68 | HR 86 | Temp 98.4°F | Wt 172.5 lb

## 2018-02-20 DIAGNOSIS — R21 Rash and other nonspecific skin eruption: Secondary | ICD-10-CM

## 2018-02-20 DIAGNOSIS — M2550 Pain in unspecified joint: Secondary | ICD-10-CM | POA: Diagnosis not present

## 2018-02-20 MED ORDER — IMIPRAMINE HCL 50 MG PO TABS: 50.0000 mg | ORAL_TABLET | Freq: Every day | ORAL | 3 refills | Status: DC

## 2018-02-20 MED ORDER — TRIAMCINOLONE ACETONIDE 0.1 % EX CREA: 1.0000 "application " | TOPICAL_CREAM | Freq: Two times a day (BID) | CUTANEOUS | 0 refills | Status: AC

## 2018-02-20 MED ORDER — MELOXICAM 15 MG PO TABS: 15.0000 mg | ORAL_TABLET | Freq: Every day | ORAL | 0 refills | Status: DC

## 2018-02-20 NOTE — Patient Instructions (Signed)
Joint Pain Joint pain, which is also called arthralgia, can be caused by many things. Joint pain often goes away when you follow your health care provider's instructions for relieving pain at home. However, joint pain can also be caused by conditions that require further treatment. Common causes of joint pain include:  Bruising in the area of the joint.  Overuse of the joint.  Wear and tear on the joints that occur with aging (osteoarthritis).  Various other forms of arthritis.  A buildup of a crystal form of uric acid in the joint (gout).  Infections of the joint (septic arthritis) or of the bone (osteomyelitis).  Your health care provider may recommend medicine to help with the pain. If your joint pain continues, additional tests may be needed to diagnose your condition. Follow these instructions at home: Watch your condition for any changes. Follow these instructions as directed to lessen the pain that you are feeling.  Take medicines only as directed by your health care provider.  Rest the affected area for as long as your health care provider says that you should. If directed to do so, raise the painful joint above the level of your heart while you are sitting or lying down.  Do not do things that cause or worsen pain.  If directed, apply ice to the painful area: ? Put ice in a plastic bag. ? Place a towel between your skin and the bag. ? Leave the ice on for 20 minutes, 2-3 times per day.  Wear an elastic bandage, splint, or sling as directed by your health care provider. Loosen the elastic bandage or splint if your fingers or toes become numb and tingle, or if they turn cold and blue.  Begin exercising or stretching the affected area as directed by your health care provider. Ask your health care provider what types of exercise are safe for you.  Keep all follow-up visits as directed by your health care provider. This is important.  Contact a health care provider if:  Your  pain increases, and medicine does not help.  Your joint pain does not improve within 3 days.  You have increased bruising or swelling.  You have a fever.  You lose 10 lb (4.5 kg) or more without trying. Get help right away if:  You are not able to move the joint.  Your fingers or toes become numb or they turn cold and blue. This information is not intended to replace advice given to you by your health care provider. Make sure you discuss any questions you have with your health care provider. Document Released: 07/25/2005 Document Revised: 12/25/2015 Document Reviewed: 05/06/2014 Elsevier Interactive Patient Education  2018 [employer]  

## 2018-02-20 NOTE — Progress Notes (Signed)
  Subjective:     Patient ID: Kelly Gilbert, female   DOB: 09-09-1960, 57 y.o.   MRN: [id number]  HPI Patient seen with polyarthralgias. She states that she's had several weeks of some progressive pain mostly in her feet but somewhat her hands as well. She really does not have any finger involvement more dorsum of the hands bilaterally and also involving the feet fairly diffusely. She's not noted any obvious swelling or redness or warmth. She's had some occasional knee pains. She's taken Advil with minimal relief. Pain is worse early in the morning when she first gets out of bed and then slowly improves. Pain usually lasts about 45 minutes to an hour.  Denies any recent fevers or chills. No tick bites. No rash. No family history of inflammatory arthritis other than the fact that she states her mom's had gout.  Separate issue is patient states she's had slightly scaly rash involving upper and lower eyelids. She's tried triamcinolone 0.1% cream which has helped to resolve her rash at times. Denies any change of makeup.  No exacerbating factors.  Past Medical History:  Diagnosis Date  . DEPRESSION 09/23/2009  . Lesion of bladder   . Migraine headache   . PONV (postoperative nausea and vomiting)    Past Surgical History:  Procedure Laterality Date  . BENIGN LEFT BREASET BX  1992  . CYSTOSCOPY WITH BIOPSY  01/16/2012   Procedure: CYSTOSCOPY WITH BIOPSY;  Surgeon: Molli Hazard, MD;  Location: [facility];  Service: Urology;  Laterality: N/A;  1 hour requested for this case  C-ARM [facility]ERA    . VAGINAL HYSTERECTOMY  1999   EPIDURAL ANES.    reports that she has been smoking cigarettes.  She has a 8.75 pack-year smoking history. She has never used smokeless tobacco. She reports that she drinks about 1.2 oz of alcohol per week. Her drug history is not on file. family history includes Cancer in her father; Cancer (age of onset: 78) in her mother; Hypertension in her father  and maternal grandmother. Allergies  Allergen Reactions  . Codeine Sulfate Nausea And Vomiting  . Sulfa Antibiotics Hives and Swelling     Review of Systems  Constitutional: Negative for chills and fever.  Respiratory: Negative for cough and shortness of breath.   Cardiovascular: Negative for chest pain.  Musculoskeletal: Positive for arthralgias. Negative for back pain and myalgias.  Skin: Positive for rash.  Hematological: Negative for adenopathy.       Objective:   Physical Exam  Constitutional: She appears well-developed and well-nourished.  Neck: Neck supple. No thyromegaly present.  Cardiovascular: Normal rate and regular rhythm.  Pulmonary/Chest: Effort normal and breath sounds normal.  Musculoskeletal: She exhibits no edema, tenderness or deformity.  Lymphadenopathy:    She has no cervical adenopathy.  Skin: Rash noted.  Patient has slightly scaly rash of upper eyelids and lower lid bilaterally.       Assessment:     #1 polyarthralgias. Doubt inflammatory arthritis based on exam and history.  #2 eczematous type rash involving upper and lower eyelids    Plan:     -Obtain further labs with C-reactive protein, sedimentation rate, antinuclear antibody, CCP antibody, rheumatoid factor -Triamcinolone 0.1% cream twice daily as needed short-term for skin rash and touch base if not clearing -Refilled imipramine  Eulas Post MD [facility]

## 2018-02-21 ENCOUNTER — Encounter: Payer: Self-pay | Admitting: Family Medicine

## 2018-02-21 LAB — SEDIMENTATION RATE: SED RATE: 13 mm/h (ref 0–30)

## 2018-02-21 LAB — C-REACTIVE PROTEIN: CRP: 0.8 mg/dL (ref 0.5–20.0)

## 2018-02-22 LAB — CYCLIC CITRUL PEPTIDE ANTIBODY, IGG

## 2018-02-22 LAB — RHEUMATOID FACTOR

## 2018-02-22 LAB — ANA: Anti Nuclear Antibody(ANA): NEGATIVE

## 2018-03-05 ENCOUNTER — Encounter: Payer: Self-pay | Admitting: Family Medicine

## 2018-03-06 ENCOUNTER — Encounter: Payer: Self-pay | Admitting: Family Medicine

## 2018-03-18 ENCOUNTER — Other Ambulatory Visit: Payer: Self-pay | Admitting: Family Medicine

## 2018-03-20 ENCOUNTER — Ambulatory Visit: Payer: 59 | Admitting: Podiatry

## 2018-03-20 ENCOUNTER — Ambulatory Visit (INDEPENDENT_AMBULATORY_CARE_PROVIDER_SITE_OTHER): Payer: 59

## 2018-03-20 ENCOUNTER — Encounter: Payer: Self-pay | Admitting: Podiatry

## 2018-03-20 VITALS — BP 118/74 | HR 88 | Resp 16

## 2018-03-20 DIAGNOSIS — M722 Plantar fascial fibromatosis: Secondary | ICD-10-CM

## 2018-03-20 MED ORDER — METHYLPREDNISOLONE 4 MG PO TBPK: ORAL_TABLET | ORAL | 0 refills | Status: DC

## 2018-03-20 MED ORDER — MELOXICAM 15 MG PO TABS: 15.0000 mg | ORAL_TABLET | Freq: Every day | ORAL | 3 refills | Status: DC

## 2018-03-20 NOTE — Patient Instructions (Signed)
For instructions on how to put on your Plantar Fascial Brace, please visit [email or web address] For instructions on how to put on your Night Splint, please visit [email or web address]   Plantar Fasciitis (Heel Spur Syndrome) with Rehab The plantar fascia is a fibrous, ligament-like, soft-tissue structure that spans the bottom of the foot. Plantar fasciitis is a condition that causes pain in the foot due to inflammation of the tissue. SYMPTOMS   Pain and tenderness on the underneath side of the foot.  Pain that worsens with standing or walking. CAUSES  Plantar fasciitis is caused by irritation and injury to the plantar fascia on the underneath side of the foot. Common mechanisms of injury include:  Direct trauma to bottom of the foot.  Damage to a small nerve that runs under the foot where the main fascia attaches to the heel bone.  Stress placed on the plantar fascia due to bone spurs. RISK INCREASES WITH:   Activities that place stress on the plantar fascia (running, jumping, pivoting, or cutting).  Poor strength and flexibility.  Improperly fitted shoes.  Tight calf muscles.  Flat feet.  Failure to warm-up properly before activity.  Obesity. PREVENTION  Warm up and stretch properly before activity.  Allow for adequate recovery between workouts.  Maintain physical fitness:  Strength, flexibility, and endurance.  Cardiovascular fitness.  Maintain a health body weight.  Avoid stress on the plantar fascia.  Wear properly fitted shoes, including arch supports for individuals who have flat feet.  PROGNOSIS  If treated properly, then the symptoms of plantar fasciitis usually resolve without surgery. However, occasionally surgery is necessary.  RELATED COMPLICATIONS   Recurrent symptoms that may result in a chronic condition.  Problems of the lower back that are caused by compensating for the injury, such as limping.  Pain or weakness of the foot during  push-off following surgery.  Chronic inflammation, scarring, and partial or complete fascia tear, occurring more often from repeated injections.  TREATMENT  Treatment initially involves the use of ice and medication to help reduce pain and inflammation. The use of strengthening and stretching exercises may help reduce pain with activity, especially stretches of the Achilles tendon. These exercises may be performed at home or with a therapist. Your caregiver may recommend that you use heel cups of arch supports to help reduce stress on the plantar fascia. Occasionally, corticosteroid injections are given to reduce inflammation. If symptoms persist for greater than 6 months despite non-surgical (conservative), then surgery may be recommended.   MEDICATION   If pain medication is necessary, then nonsteroidal anti-inflammatory medications, such as aspirin and ibuprofen, or other minor pain relievers, such as acetaminophen, are often recommended.  Do not take pain medication within 7 days before surgery.  Prescription pain relievers may be given if deemed necessary by your caregiver. Use only as directed and only as much as you need.  Corticosteroid injections may be given by your caregiver. These injections should be reserved for the most serious cases, because they may only be given a certain number of times.  HEAT AND COLD  Cold treatment (icing) relieves pain and reduces inflammation. Cold treatment should be applied for 10 to 15 minutes every 2 to 3 hours for inflammation and pain and immediately after any activity that aggravates your symptoms. Use ice packs or massage the area with a piece of ice (ice massage).  Heat treatment may be used prior to performing the stretching and strengthening activities prescribed by your caregiver, physical therapist, or athletic trainer. Use a  heat pack or soak the injury in warm water.  SEEK IMMEDIATE MEDICAL CARE IF:  Treatment seems to offer no benefit,  or the condition worsens.  Any medications produce adverse side effects.  EXERCISES- RANGE OF MOTION (ROM) AND STRETCHING EXERCISES - Plantar Fasciitis (Heel Spur Syndrome) These exercises may help you when beginning to rehabilitate your injury. Your symptoms may resolve with or without further involvement from your physician, physical therapist or athletic trainer. While completing these exercises, remember:   Restoring tissue flexibility helps normal motion to return to the joints. This allows healthier, less painful movement and activity.  An effective stretch should be held for at least 30 seconds.  A stretch should never be painful. You should only feel a gentle lengthening or release in the stretched tissue.  RANGE OF MOTION - Toe Extension, Flexion  Sit with your right / left leg crossed over your opposite knee.  Grasp your toes and gently pull them back toward the top of your foot. You should feel a stretch on the bottom of your toes and/or foot.  Hold this stretch for 10 seconds.  Now, gently pull your toes toward the bottom of your foot. You should feel a stretch on the top of your toes and or foot.  Hold this stretch for 10 seconds. Repeat  times. Complete this stretch 3 times per day.   RANGE OF MOTION - Ankle Dorsiflexion, Active Assisted  Remove shoes and sit on a chair that is preferably not on a carpeted surface.  Place right / left foot under knee. Extend your opposite leg for support.  Keeping your heel down, slide your right / left foot back toward the chair until you feel a stretch at your ankle or calf. If you do not feel a stretch, slide your bottom forward to the edge of the chair, while still keeping your heel down.  Hold this stretch for 10 seconds. Repeat 3 times. Complete this stretch 2 times per day.   STRETCH  Gastroc, Standing  Place hands on wall.  Extend right / left leg, keeping the front knee somewhat bent.  Slightly point your toes inward  on your back foot.  Keeping your right / left heel on the floor and your knee straight, shift your weight toward the wall, not allowing your back to arch.  You should feel a gentle stretch in the right / left calf. Hold this position for 10 seconds. Repeat 3 times. Complete this stretch 2 times per day.  STRETCH  Soleus, Standing  Place hands on wall.  Extend right / left leg, keeping the other knee somewhat bent.  Slightly point your toes inward on your back foot.  Keep your right / left heel on the floor, bend your back knee, and slightly shift your weight over the back leg so that you feel a gentle stretch deep in your back calf.  Hold this position for 10 seconds. Repeat 3 times. Complete this stretch 2 times per day.  STRETCH  Gastrocsoleus, Standing  Note: This exercise can place a lot of stress on your foot and ankle. Please complete this exercise only if specifically instructed by your caregiver.   Place the ball of your right / left foot on a step, keeping your other foot firmly on the same step.  Hold on to the wall or a rail for balance.  Slowly lift your other foot, allowing your body weight to press your heel down over the edge of the step.  You  should feel a stretch in your right / left calf.  Hold this position for 10 seconds.  Repeat this exercise with a slight bend in your right / left knee. Repeat 3 times. Complete this stretch 2 times per day.   STRENGTHENING EXERCISES - Plantar Fasciitis (Heel Spur Syndrome)  These exercises may help you when beginning to rehabilitate your injury. They may resolve your symptoms with or without further involvement from your physician, physical therapist or athletic trainer. While completing these exercises, remember:   Muscles can gain both the endurance and the strength needed for everyday activities through controlled exercises.  Complete these exercises as instructed by your physician, physical therapist or athletic  trainer. Progress the resistance and repetitions only as guided.  STRENGTH - Towel Curls  Sit in a chair positioned on a non-carpeted surface.  Place your foot on a towel, keeping your heel on the floor.  Pull the towel toward your heel by only curling your toes. Keep your heel on the floor. Repeat 3 times. Complete this exercise 2 times per day.  STRENGTH - Ankle Inversion  Secure one end of a rubber exercise band/tubing to a fixed object (table, pole). Loop the other end around your foot just before your toes.  Place your fists between your knees. This will focus your strengthening at your ankle.  Slowly, pull your big toe up and in, making sure the band/tubing is positioned to resist the entire motion.  Hold this position for 10 seconds.  Have your muscles resist the band/tubing as it slowly pulls your foot back to the starting position. Repeat 3 times. Complete this exercises 2 times per day.  Document Released: 07/25/2005 Document Revised: 10/17/2011 Document Reviewed: 11/06/2008 [facility] Patient Information 2014 [place], [place].

## 2018-03-21 NOTE — Progress Notes (Signed)
Subjective:  Patient ID: Kelly Gilbert, female    DOB: Jun 02, 1961,  MRN: [id number] HPI Chief Complaint  Patient presents with  . Foot Pain    Lateral sides and dorsal midfoot bilateral (R>L) - aching x several months, knot on side of right, AM pain, PCP rx'd meloxicam- no help  . New Patient (Initial Visit)    57 y.o. female presents with the above complaint.   ROS: Denies fever chills nausea vomiting muscle aches pains calf pain back pain chest pain shortness of breath headache.  Past Medical History:  Diagnosis Date  . DEPRESSION 09/23/2009  . Lesion of bladder   . Migraine headache   . PONV (postoperative nausea and vomiting)    Past Surgical History:  Procedure Laterality Date  . BENIGN LEFT BREASET BX  1992  . CYSTOSCOPY WITH BIOPSY  01/16/2012   Procedure: CYSTOSCOPY WITH BIOPSY;  Surgeon: Molli Hazard, MD;  Location: [facility];  Service: Urology;  Laterality: N/A;  1 hour requested for this case  C-ARM CAMERA    . VAGINAL HYSTERECTOMY  1999   EPIDURAL ANES.    Current Outpatient Medications:  .  amphetamine-dextroamphetamine (ADDERALL) 15 MG tablet, Take 1 tablet by mouth 2 (two) times daily. May refill in one month, Disp: 60 tablet, Rfl: 0 .  Aspirin-Acetaminophen-Caffeine (EXCEDRIN PO), Take by mouth. ON HOLD, Disp: , Rfl:  .  Biotin 5000 MCG CAPS, Take by mouth., Disp: , Rfl:  .  Diphenhydramine-Pseudoephed (BENADRYL ALLERGY/SINUS PO), Take by mouth., Disp: , Rfl:  .  estradiol (ESTRACE) 1 MG tablet, TAKE 1 TABLET BY MOUTH EVERY DAY, Disp: 90 tablet, Rfl: 1 .  imipramine (TOFRANIL) 50 MG tablet, Take 1 tablet (50 mg total) by mouth at bedtime., Disp: 90 tablet, Rfl: 3 .  meloxicam (MOBIC) 15 MG tablet, Take 1 tablet (15 mg total) by mouth daily., Disp: 30 tablet, Rfl: 3 .  methylPREDNISolone (MEDROL DOSEPAK) 4 MG TBPK tablet, 6 day dose pack - take as directed, Disp: 21 tablet, Rfl: 0 .  triamcinolone cream (KENALOG) 0.1 %, Apply 1  application topically 2 (two) times daily. For 1-2 weeks., Disp: 45 g, Rfl: 0  Allergies  Allergen Reactions  . Codeine Sulfate Nausea And Vomiting  . Sulfa Antibiotics Hives and Swelling   Review of Systems Objective:   Vitals:   03/20/18 1448  BP: 118/74  Pulse: 88  Resp: 16    General: Well developed, nourished, in no acute distress, alert and oriented x3   Dermatological: Skin is warm, dry and supple bilateral. Nails x 10 are well maintained; remaining integument appears unremarkable at this time. There are no open sores, no preulcerative lesions, no rash or signs of infection present.  Vascular: Dorsalis Pedis artery and Posterior Tibial artery pedal pulses are 2/4 bilateral with immedate capillary fill time. Pedal hair growth present. No varicosities and no lower extremity edema present bilateral.   Neruologic: Grossly intact via light touch bilateral. Vibratory intact via tuning fork bilateral. Protective threshold with Semmes Wienstein monofilament intact to all pedal sites bilateral. Patellar and Achilles deep tendon reflexes 2+ bilateral. No Babinski or clonus noted bilateral.   Musculoskeletal: No gross boney pedal deformities bilateral. No pain, crepitus, or limitation noted with foot and ankle range of motion bilateral. Muscular strength 5/5 in all groups tested bilateral.  Gait: Unassisted, Nonantalgic.    Radiographs:  Radiographs taken today demonstrate normal osseous architecture with exception of soft tissue increase in density of plantar fashion calcaneal insertion  sites bilaterally left appears to be worse than the right.  Assessment & Plan:   Assessment: Plantar fasciitis with lateral compensatory syndrome bilaterally.  Plan: Discussed etiology pathology conservative versus surgical therapies.  At this point after sterile Betadine skin prep I injected 20 mg Kenalog 5 mg Marcaine point maximal tenderness bilateral.  Tolerated procedure well without  complications.  Placed on a Medrol Dosepak to be followed by the meloxicam which I refilled for her today.  Also placed her on plantar pressure brace and a night splint.  Discussed appropriate shoe gear stretching exercise ice therapy sugar modification she was provided with stretching exercises and I will follow-up with her in 1 month.     Kelly Gilbert T. [place], [place]

## 2018-03-23 ENCOUNTER — Encounter: Payer: Self-pay | Admitting: *Deleted

## 2018-04-17 ENCOUNTER — Encounter: Payer: Self-pay | Admitting: Podiatry

## 2018-04-17 ENCOUNTER — Ambulatory Visit: Payer: 59 | Admitting: Podiatry

## 2018-04-17 DIAGNOSIS — M722 Plantar fascial fibromatosis: Secondary | ICD-10-CM

## 2018-04-18 NOTE — Progress Notes (Signed)
She presents today for follow-up of bilateral heels.  She states that they are doing some better.  She states about 50% improved.  Objective: Vital signs are stable she is alert and oriented x3.  States that she continues to do all of her conservative therapies including her plantar fascial brace her night splint and her medications and her shoe gear.  She has no reproducible pain on palpation of bilateral heels today.  Assessment: At least 50% resolved but seems to be better than that to me.  Plan: I am going to recommend that she continue all conservative therapies until she is 100% improved.  If she regresses then we will need to see by getting her into a pair of orthotics.  She will notify us immediately at that time.

## 2018-05-17 ENCOUNTER — Ambulatory Visit: Payer: 59 | Admitting: Podiatry

## 2018-06-12 ENCOUNTER — Encounter: Payer: Self-pay | Admitting: Internal Medicine

## 2018-07-10 ENCOUNTER — Encounter: Payer: Self-pay | Admitting: Family Medicine

## 2018-07-10 ENCOUNTER — Other Ambulatory Visit: Payer: Self-pay | Admitting: Family Medicine

## 2018-07-13 NOTE — Telephone Encounter (Signed)
I called the pt, apologized for the delay in someone responding to her Mychart note and informed her of the message below.  Patient stated the pain she had has now resolved and an appt was scheduled for 12/9.  She agreed to go to the ER or call 911 if needed in the meantime.

## 2018-07-16 ENCOUNTER — Other Ambulatory Visit: Payer: Self-pay

## 2018-07-16 ENCOUNTER — Ambulatory Visit: Payer: 59 | Admitting: Family Medicine

## 2018-07-16 ENCOUNTER — Encounter: Payer: Self-pay | Admitting: Family Medicine

## 2018-07-16 VITALS — BP 126/86 | HR 55 | Temp 98.2°F | Wt 177.4 lb

## 2018-07-16 DIAGNOSIS — R079 Chest pain, unspecified: Secondary | ICD-10-CM | POA: Diagnosis not present

## 2018-07-16 MED ORDER — ESTRADIOL 1 MG PO TABS: 1.0000 mg | ORAL_TABLET | Freq: Every day | ORAL | 2 refills | Status: DC

## 2018-07-16 NOTE — Patient Instructions (Signed)
Nonspecific Chest Pain °Chest pain can be caused by many different conditions. There is always a chance that your pain could be related to something serious, such as a heart attack or a blood clot in your lungs. Chest pain can also be caused by conditions that are not life-threatening. If you have chest pain, it is very important to follow up with your health care provider. °What are the causes? °Causes of this condition include: °· Heartburn. °· Pneumonia or bronchitis. °· Anxiety or stress. °· Inflammation around your heart (pericarditis) or lung (pleuritis or pleurisy). °· A blood clot in your lung. °· A collapsed lung (pneumothorax). This can develop suddenly on its own (spontaneous pneumothorax) or from trauma to the chest. °· Shingles infection (varicella-zoster virus). °· Heart attack. °· Damage to the bones, muscles, and cartilage that make up your chest wall. This can include: °? Bruised bones due to injury. °? Strained muscles or cartilage due to frequent or repeated coughing or overwork. °? Fracture to one or more ribs. °? Sore cartilage due to inflammation (costochondritis). ° °What increases the risk? °Risk factors for this condition may include: °· Activities that increase your risk for trauma or injury to your chest. °· Respiratory infections or conditions that cause frequent coughing. °· Medical conditions or overeating that can cause heartburn. °· Heart disease or family history of heart disease. °· Conditions or health behaviors that increase your risk of developing a blood clot. °· Having had chicken pox (varicella zoster). ° °What are the signs or symptoms? °Chest pain can feel like: °· Burning or tingling on the surface of your chest or deep in your chest. °· Crushing, pressure, aching, or squeezing pain. °· Dull or sharp pain that is worse when you move, cough, or take a deep breath. °· Pain that is also felt in your back, neck, shoulder, or arm, or pain that spreads to any of these  areas. ° °Your chest pain may come and go, or it may stay constant. °How is this diagnosed? °Lab tests or other studies may be needed to find the cause of your pain. Your health care provider may have you take a test called an ECG (electrocardiogram). An ECG records your heartbeat patterns at the time the test is performed. You may also have other tests, such as: °· Transthoracic echocardiogram (TTE). In this test, sound waves are used to create a picture of the heart structures and to look at how blood flows through your heart. °· Transesophageal echocardiogram (TEE). This is a more advanced imaging test that takes images from inside your body. It allows your health care provider to see your heart in finer detail. °· Cardiac monitoring. This allows your health care provider to monitor your heart rate and rhythm in real time. °· Holter monitor. This is a portable device that records your heartbeat and can help to diagnose abnormal heartbeats. It allows your health care provider to track your heart activity for several days, if needed. °· Stress tests. These can be done through exercise or by taking medicine that makes your heart beat more quickly. °· Blood tests. °· Other imaging tests. ° °How is this treated? °Treatment depends on what is causing your chest pain. Treatment may include: °· Medicines. These may include: °? Acid blockers for heartburn. °? Anti-inflammatory medicine. °? Pain medicine for inflammatory conditions. °? Antibiotic medicine, if an infection is present. °? Medicines to dissolve blood clots. °? Medicines to treat coronary artery disease (CAD). °· Supportive care for conditions that   do not require medicines. This may include: °? Resting. °? Applying heat or cold packs to injured areas. °? Limiting activities until pain decreases. ° °Follow these instructions at home: °Medicines °· If you were prescribed an antibiotic, take it as told by your health care provider. Do not stop taking the  antibiotic even if you start to feel better. °· Take over-the-counter and prescription medicines only as told by your health care provider. °Lifestyle °· Do not use any products that contain nicotine or tobacco, such as cigarettes and e-cigarettes. If you need help quitting, ask your health care provider. °· Do not drink alcohol. °· Make lifestyle changes as directed by your health care provider. These may include: °? Getting regular exercise. Ask your health care provider to suggest some activities that are safe for you. °? Eating a heart-healthy diet. A registered dietitian can help you to learn healthy eating options. °? Maintaining a healthy weight. °? Managing diabetes, if necessary. °? Reducing stress, such as with yoga or relaxation techniques. °General instructions °· Avoid any activities that bring on chest pain. °· If heartburn is the cause for your chest pain, raise (elevate) the head of your bed about 6 inches (15 cm) by putting blocks under the legs. Sleeping with more pillows does not effectively relieve heartburn because it only changes the position of your head. °· Keep all follow-up visits as told by your health care provider. This is important. This includes any further testing if your chest pain does not go away. °Contact a health care provider if: °· Your chest pain does not go away. °· You have a rash with blisters on your chest. °· You have a fever. °· You have chills. °Get help right away if: °· Your chest pain is worse. °· You have a cough that gets worse, or you cough up blood. °· You have severe pain in your abdomen. °· You have severe weakness. °· You faint. °· You have sudden, unexplained chest discomfort. °· You have sudden, unexplained discomfort in your arms, back, neck, or jaw. °· You have shortness of breath at any time. °· You suddenly start to sweat, or your skin gets clammy. °· You feel nauseous or you vomit. °· You suddenly feel light-headed or dizzy. °· Your heart begins to beat  quickly, or it feels like it is skipping beats. °These symptoms may represent a serious problem that is an emergency. Do not wait to see if the symptoms will go away. Get medical help right away. Call your local emergency services (911 in the [place] Do not drive yourself to the hospital. °This information is not intended to replace advice given to you by your health care provider. Make sure you discuss any questions you have with your health care provider. °Document Released: 05/04/2005 Document Revised: 04/18/2016 Document Reviewed: 04/18/2016 °[employer] Interactive Patient Education © 2017 [employer] ° °

## 2018-07-16 NOTE — Progress Notes (Signed)
Subjective:     Patient ID: Kelly Gilbert, female   DOB: September 06, 1960, 57 y.o.   MRN: [id number]  HPI Patient is seen with some intermittent chest discomfort.  She had called last week with similar symptoms.  She has been taking some Prilosec since then and symptoms are better, though not fully resolved.    She describes some substernal chest pressure that occurs at rest and can last several hours.  No exertional symptoms.  Does not exercise regularly.  She is also describes some intermittent tingling sensation left arm.  No heaviness in the arm.  No neck discomfort.  Patient had somewhat similar symptoms back in 2018 and was referred for myocardial perfusion imaging May 2018 but this was denied by insurance.  She never pursued further evaluation at that time.  She does smoke about 1/2 pack cigarettes per day.  No recent increased cough.  No hemoptysis.  No pleuritic pain.  No fevers or chills.  No major appetite or weight changes.  She had a maternal grandmother that died of "congestive heart failure ".  She was age 49.  No other family history of known heart disease.  No hx of diabetes.  Mild hyperlipidemia.    Past Medical History:  Diagnosis Date  . DEPRESSION 09/23/2009  . Lesion of bladder   . Migraine headache   . PONV (postoperative nausea and vomiting)    Past Surgical History:  Procedure Laterality Date  . BENIGN LEFT BREASET BX  1992  . CYSTOSCOPY WITH BIOPSY  01/16/2012   Procedure: CYSTOSCOPY WITH BIOPSY;  Surgeon: Molli Hazard, MD;  Location: [facility];  Service: Urology;  Laterality: N/A;  1 hour requested for this case  C-ARM [facility]ERA    . VAGINAL HYSTERECTOMY  1999   EPIDURAL ANES.    reports that she has been smoking cigarettes. She has a 8.75 pack-year smoking history. She has never used smokeless tobacco. She reports that she drinks about 2.0 standard drinks of alcohol per week. Her drug history is not on file. family history includes  Cancer in her father; Cancer (age of onset: 54) in her mother; Hypertension in her father and maternal grandmother. Allergies  Allergen Reactions  . Codeine Sulfate Nausea And Vomiting  . Sulfa Antibiotics Hives and Swelling       Review of Systems  Constitutional: Negative for chills, fever and unexpected weight change.  HENT: Negative for trouble swallowing.   Respiratory: Positive for shortness of breath. Negative for cough.   Cardiovascular: Positive for chest pain. Negative for palpitations and leg swelling.  Gastrointestinal: Negative for abdominal pain.       Objective:   Physical Exam  Constitutional: She appears well-developed and well-nourished.  Neck: No thyromegaly present.  Cardiovascular: Normal rate and regular rhythm.  Pulmonary/Chest: Effort normal and breath sounds normal. She has no decreased breath sounds. She has no wheezes. She has no rales.  Musculoskeletal:       Right lower leg: She exhibits no edema.       Left lower leg: She exhibits no edema.  Lymphadenopathy:    She has no cervical adenopathy.  Vitals reviewed.      Assessment:     Patient presents with somewhat atypical chest pain past week or so.  Symptoms currently improved compared to last week after starting Prilosec.  She does have at least moderate risk factors with age, smoking status, history of hyperlipidemia    Plan:     -Start with repeat  EKG.  Shows NSR with no acute changes. -Set up cardiology referral -Discussed smoking cessation.  We discussed [place] quit line but she declines at this time.  She hopes to quit on her own. -continue Prilosec for now until cardiac evaluation.  Eulas Post MD [facility] at [facility]

## 2018-07-27 ENCOUNTER — Ambulatory Visit (HOSPITAL_COMMUNITY)
Admission: RE | Admit: 2018-07-27 | Discharge: 2018-07-27 | Disposition: A | Discharge status: Home / Self Care | Payer: 59 | Source: Ambulatory Visit | Attending: Cardiology | Admitting: Cardiology

## 2018-07-27 ENCOUNTER — Ambulatory Visit: Payer: 59 | Admitting: Cardiology

## 2018-07-27 ENCOUNTER — Encounter: Payer: Self-pay | Admitting: Cardiology

## 2018-07-27 VITALS — BP 104/62 | HR 79 | Ht 66.5 in | Wt 179.0 lb

## 2018-07-27 DIAGNOSIS — R072 Precordial pain: Secondary | ICD-10-CM | POA: Insufficient documentation

## 2018-07-27 DIAGNOSIS — R0602 Shortness of breath: Secondary | ICD-10-CM | POA: Diagnosis not present

## 2018-07-27 DIAGNOSIS — Z720 Tobacco use: Secondary | ICD-10-CM | POA: Diagnosis not present

## 2018-07-27 IMAGING — DX DG CHEST 2V
2 series · 2 of 2 positions shown · non-contrast
Comparison: None.

CLINICAL DATA: Precordial pain and short of breath

EXAM:
CHEST - 2 VIEW

[chest pa]
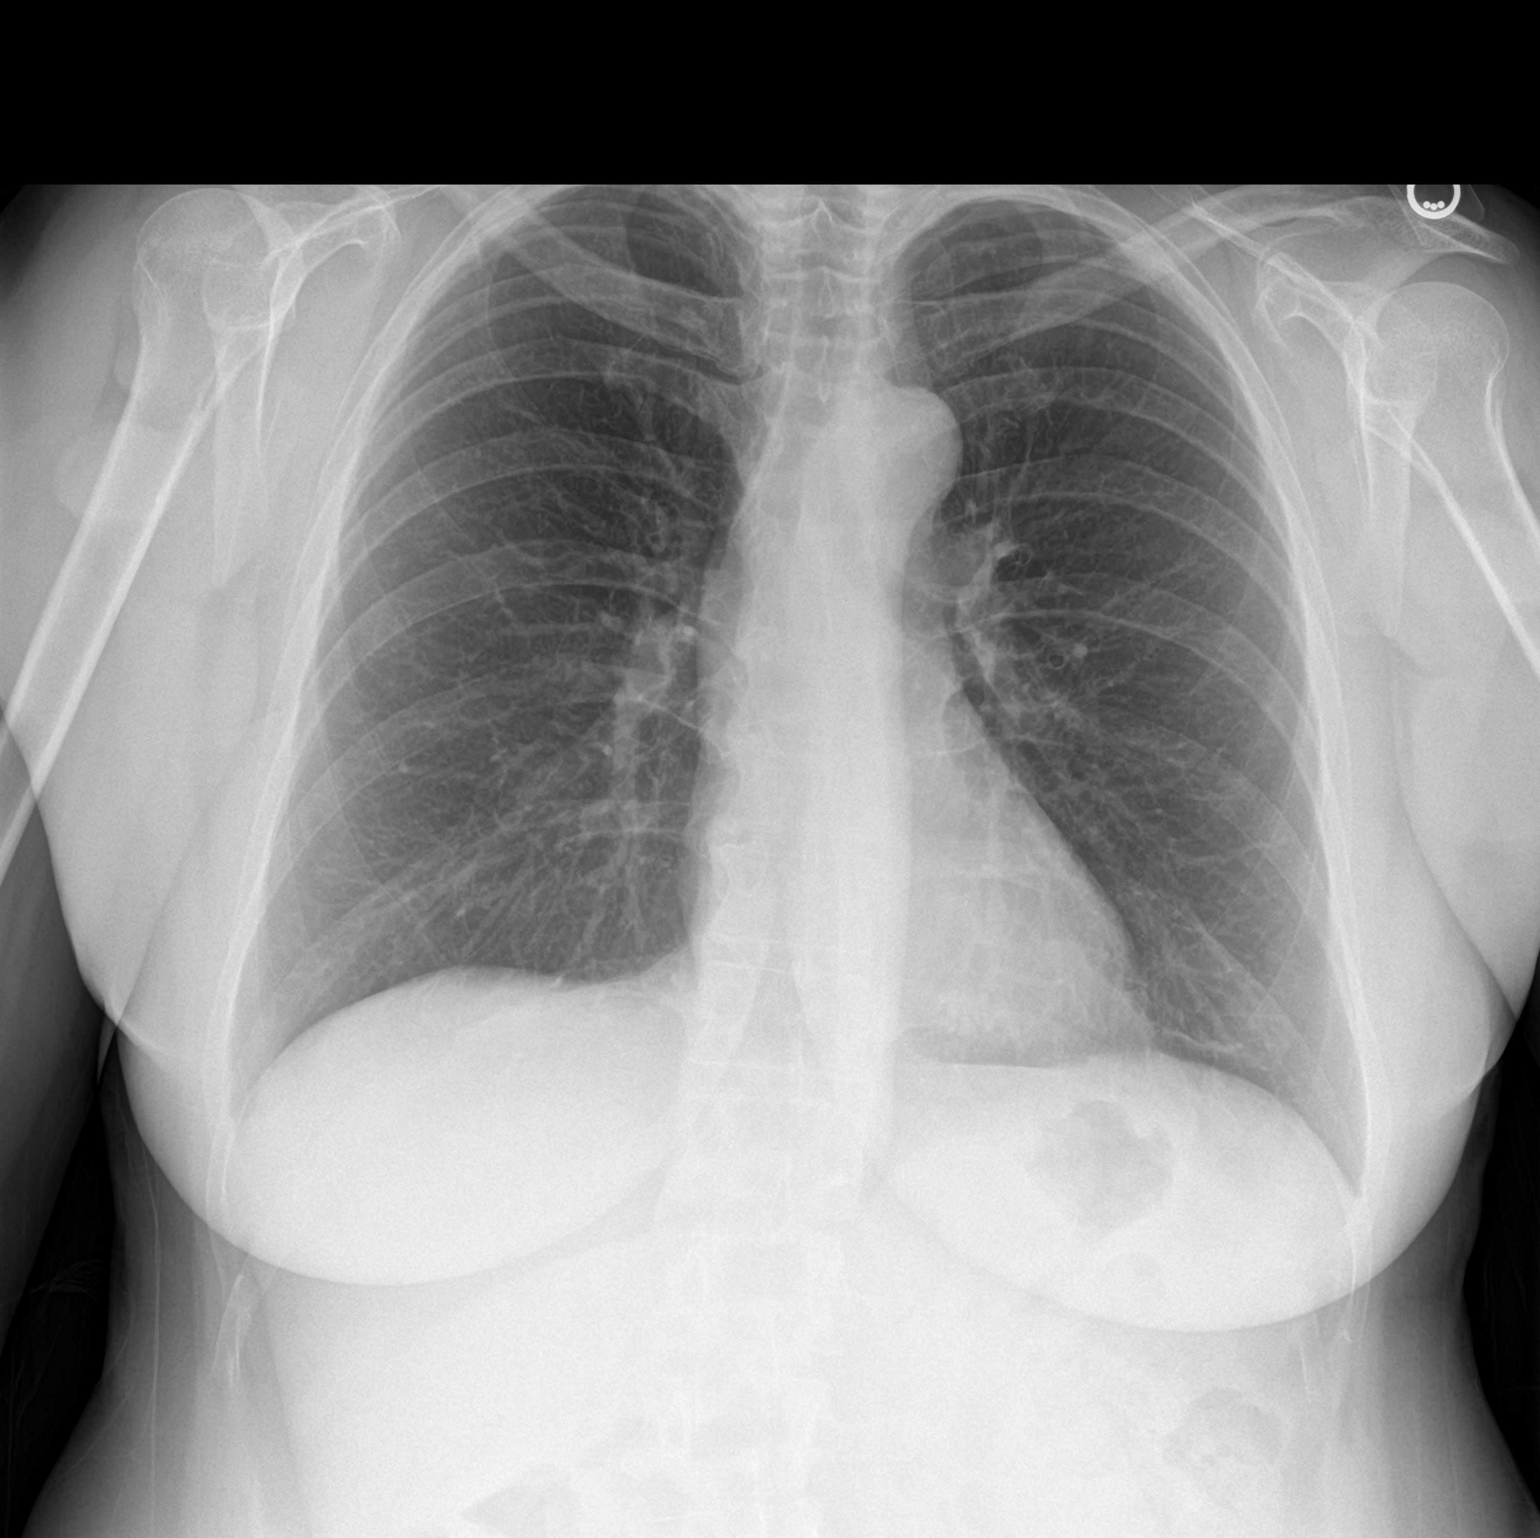

[chest lat]
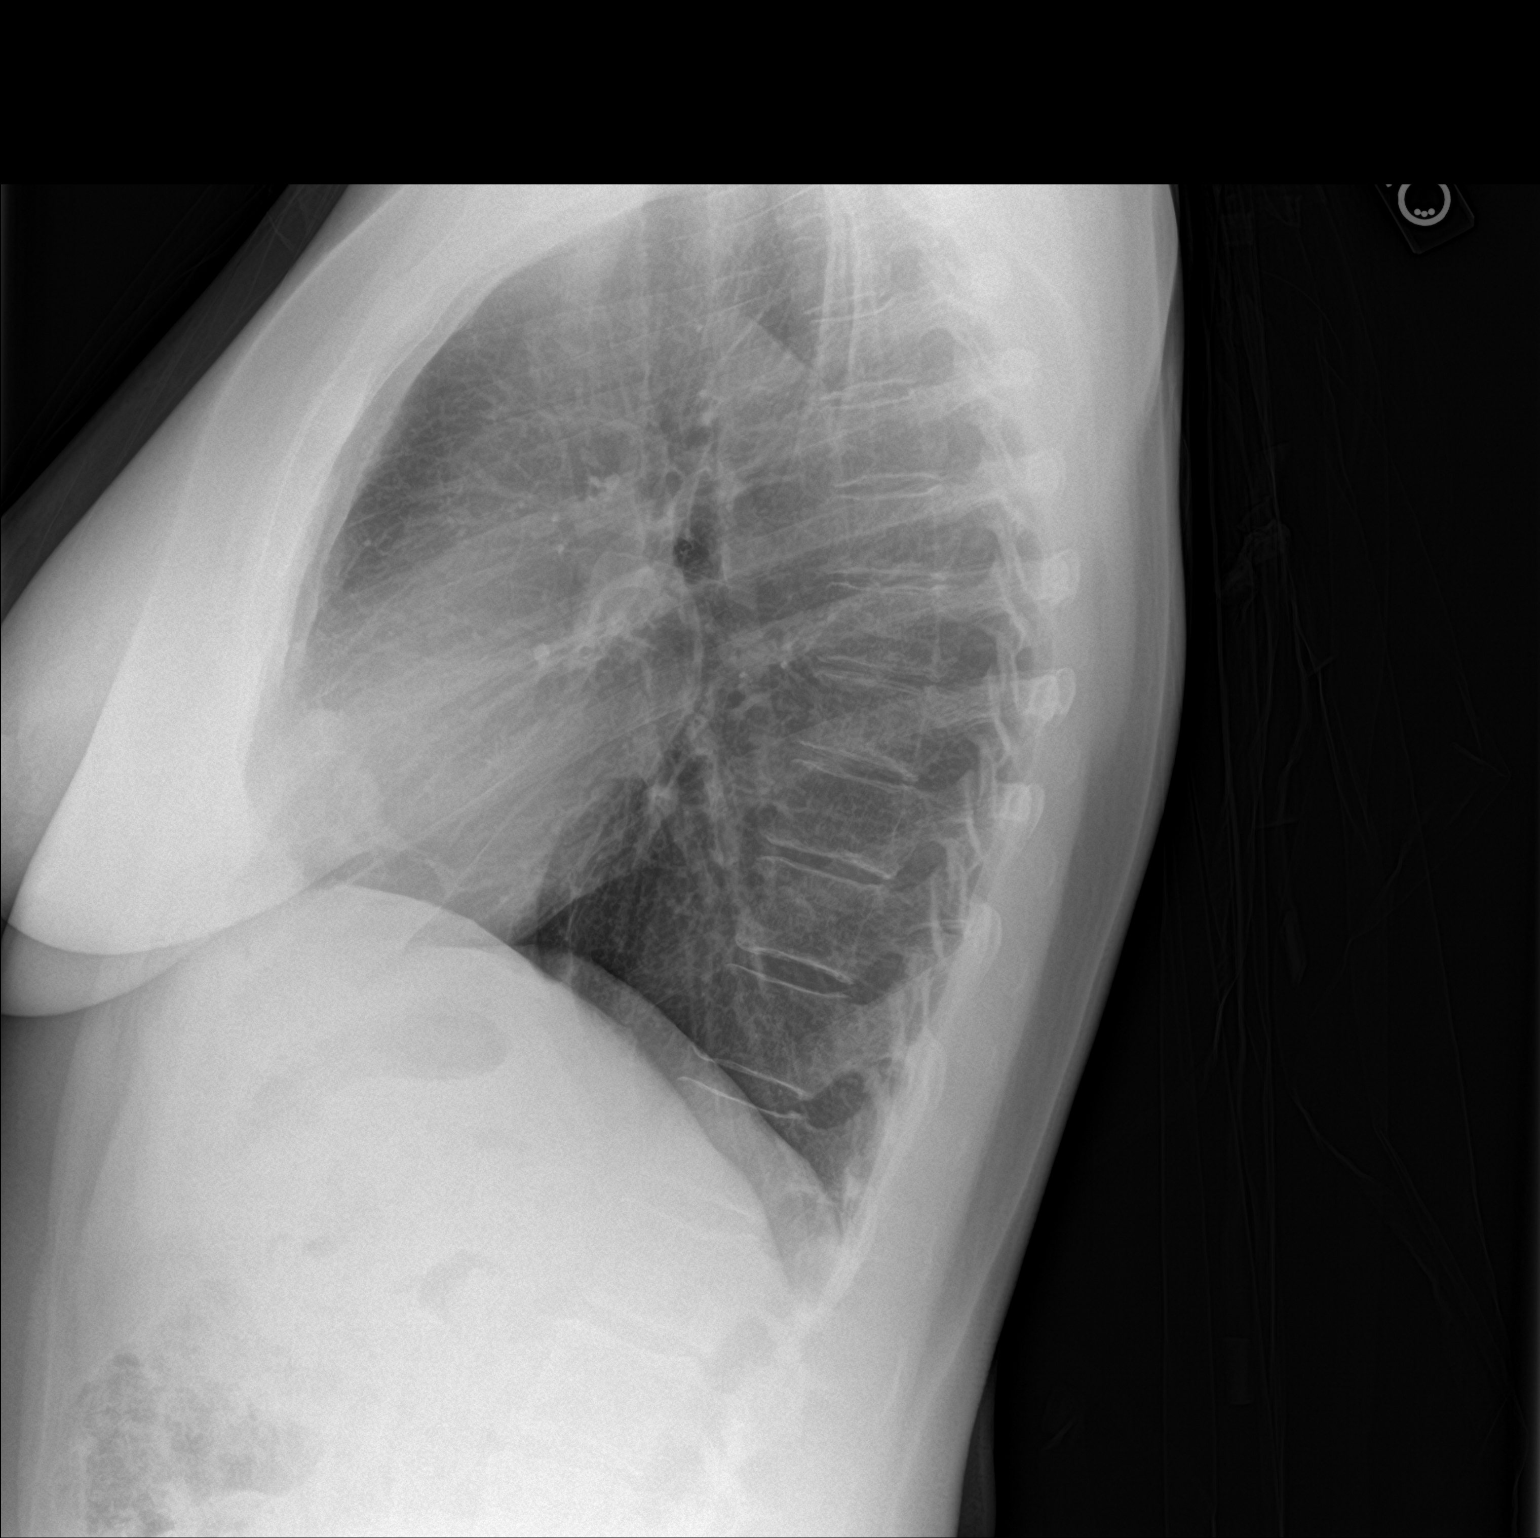

[2 of 2 positions shown; findings below may reference images not displayed]

FINDINGS: The heart size and mediastinal contours are within normal limits.
Both lungs are clear. The visualized skeletal structures are
unremarkable.
IMPRESSION: No active cardiopulmonary disease.

## 2018-07-27 NOTE — Patient Instructions (Signed)
Medication Instructions:  NO CHANGE If you need a refill on your cardiac medications before your next appointment, please call your pharmacy.   Lab work: If you have labs (blood work) drawn today and your tests are completely normal, you will receive your results only by: Marland Kitchen MyChart Message (if you have MyChart) OR . A paper copy in the mail If you have any lab test that is abnormal or we need to change your treatment, we will call you to review the results.  Testing/Procedures: Your physician has requested that you have an exercise tolerance test. For further information please visit [email or web address]. Please also follow instruction sheet, as given.   CT CARDIAC SCORE AT THE [address] [facility]  A chest x-ray takes a picture of the organs and structures inside the chest, including the heart, lungs, and blood vessels. This test can show several things, including, whether the heart is enlarges; whether fluid is building up in the lungs; and whether pacemaker / defibrillator leads are still in place. AT [facility]  Follow-Up: Your physician recommends that you schedule a follow-up appointment in: AS NEEDED PENDING TEST RESULTS

## 2018-07-27 NOTE — Progress Notes (Signed)
Referring-Bruce Burchette, MD Reason for referral-chest pain  HPI: 57 year old female for evaluation of chest pain at request of Carolann Littler, MD.  No prior cardiac history.  Around the time of Thanksgiving patient describes chest heaviness.  It was in the left upper chest area radiating to her left upper extremity.  Essentially continuous for 7 to 10 days.  Not pleuritic or positional.  Not exertional.  Some increased with lying flat.  Question water brash and improvement with Prilosec.  She has had occasional chest heaviness since.  She also notes dyspnea on exertion but no orthopnea, PND, pedal edema or syncope.  Cardiology asked to evaluate.  Current Outpatient Medications  Medication Sig Dispense Refill  . amphetamine-dextroamphetamine (ADDERALL) 15 MG tablet Take 1 tablet by mouth 2 (two) times daily. May refill in one month (Patient taking differently: Take 15 mg by mouth as needed. May refill in one month) 60 tablet 0  . Aspirin-Acetaminophen-Caffeine (EXCEDRIN PO) Take by mouth. ON HOLD    . Biotin 5000 MCG CAPS Take by mouth.    . Diphenhydramine-Pseudoephed (BENADRYL ALLERGY/SINUS PO) Take by mouth.    . estradiol (ESTRACE) 1 MG tablet Take 1 tablet (1 mg total) by mouth daily. 30 tablet 2  . imipramine (TOFRANIL) 50 MG tablet Take 1 tablet (50 mg total) by mouth at bedtime. 90 tablet 3  . meloxicam (MOBIC) 15 MG tablet Take 1 tablet (15 mg total) by mouth daily. 30 tablet 3  . omeprazole (PRILOSEC) 20 MG capsule Take 20 mg by mouth daily.    Marland Kitchen triamcinolone cream (KENALOG) 0.1 % Apply 1 application topically 2 (two) times daily. For 1-2 weeks. 45 g 0   No current facility-administered medications for this visit.     Allergies  Allergen Reactions  . Codeine Sulfate Nausea And Vomiting  . Sulfa Antibiotics Hives and Swelling     Past Medical History:  Diagnosis Date  . DEPRESSION 09/23/2009  . Lesion of bladder   . Migraine headache   . PONV (postoperative nausea and  vomiting)     Past Surgical History:  Procedure Laterality Date  . BENIGN LEFT BREASET BX  1992  . CYSTOSCOPY WITH BIOPSY  01/16/2012   Procedure: CYSTOSCOPY WITH BIOPSY;  Surgeon: Molli Hazard, MD;  Location: [facility];  Service: Urology;  Laterality: N/A;  1 hour requested for this case  C-ARM [facility]ERA    . VAGINAL HYSTERECTOMY  1999   EPIDURAL ANES.    Social History   Socioeconomic History  . Marital status: Married    Spouse name: Not on file  . Number of children: 2  . Years of education: Not on file  . Highest education level: Not on file  Occupational History    Comment: Realtor  Social Needs  . Financial resource strain: Not on file  . Food insecurity:    Worry: Not on file    Inability: Not on file  . Transportation needs:    Medical: Not on file    Non-medical: Not on file  Tobacco Use  . Smoking status: Current Every Day Smoker    Packs/day: 0.25    Years: 35.00    Pack years: 8.75    Types: Cigarettes  . Smokeless tobacco: Never Used  . Tobacco comment: QUIT SMOKING 2008--  RECENTLY STARTED BACK 7-8 CIG. PER DAY  Substance and Sexual Activity  . Alcohol use: Yes    Alcohol/week: 2.0 standard drinks    Types: 2 Shots of  liquor per week    Comment: Occasional  . Drug use: Not on file  . Sexual activity: Not on file  Lifestyle  . Physical activity:    Days per week: Not on file    Minutes per session: Not on file  . Stress: Not on file  Relationships  . Social connections:    Talks on phone: Not on file    Gets together: Not on file    Attends religious service: Not on file    Active member of club or organization: Not on file    Attends meetings of clubs or organizations: Not on file    Relationship status: Not on file  . Intimate partner violence:    Fear of current or ex partner: Not on file    Emotionally abused: Not on file    Physically abused: Not on file    Forced sexual activity: Not on file  Other Topics  Concern  . Not on file  Social History Narrative  . Not on file    Family History  Problem Relation Age of Onset  . Hypertension Father   . Cancer Father        prostate  . Hypertension Maternal Grandmother   . Cancer Mother 24       renal cell cancer  . Colon cancer Neg Hx   . Pancreatic cancer Neg Hx   . Stomach cancer Neg Hx     ROS: no fevers or chills, productive cough, hemoptysis, dysphasia, odynophagia, melena, hematochezia, dysuria, hematuria, rash, seizure activity, orthopnea, PND, pedal edema, claudication. Remaining systems are negative.  Physical Exam:   Blood pressure 104/62, pulse 79, height 5' 6.5" (1.689 m), weight 179 lb (81.2 kg).  General:  Well developed/well nourished in NAD Skin warm/dry Patient not depressed No peripheral clubbing Back-normal HEENT-normal/normal eyelids Neck supple/normal carotid upstroke bilaterally; no bruits; no JVD; no thyromegaly chest - CTA/ normal expansion CV - RRR/normal S1 and S2; no murmurs, rubs or gallops;  PMI nondisplaced Abdomen -NT/ND, no HSM, no mass, + bowel sounds, no bruit 2+ femoral pulses, no bruits Ext-no edema, chords, 2+ DP Neuro-grossly nonfocal  ECG -July 16, 2018-sinus rhythm with no ST changes.  Personally reviewed  Today's electrocardiogram shows sinus rhythm with no ST changes.  Personally reviewed.  A/P  1 chest pain-symptoms are somewhat atypical.  Electrocardiogram shows no ST changes.  We will risk stratify with an exercise treadmill and calcium score.  2 dyspnea-some dyspnea on exertion.  Also with history of tobacco use.  We will arrange a PA and lateral chest x-ray.  3 tobacco abuse-patient counseled on discontinuing.  Kirk Ruths, MD

## 2018-08-10 ENCOUNTER — Telehealth (HOSPITAL_COMMUNITY): Payer: Self-pay

## 2018-08-10 NOTE — Telephone Encounter (Signed)
Encounter complete. 

## 2018-08-14 ENCOUNTER — Ambulatory Visit (INDEPENDENT_AMBULATORY_CARE_PROVIDER_SITE_OTHER)
Admission: RE | Admit: 2018-08-14 | Discharge: 2018-08-14 | Disposition: A | Discharge status: Home / Self Care | Payer: Self-pay | Source: Ambulatory Visit | Attending: Cardiology | Admitting: Cardiology

## 2018-08-14 DIAGNOSIS — R0602 Shortness of breath: Secondary | ICD-10-CM

## 2018-08-14 DIAGNOSIS — R072 Precordial pain: Secondary | ICD-10-CM

## 2018-08-14 IMAGING — CT CT HEART SCORING
2 series · 16 of 20 positions shown, 18 images · non-contrast
Comparison: None.

Addendum:
EXAM:
OVER-READ INTERPRETATION  CT CHEST

The following report is an over-read performed by radiologist Dr.
ASEN [REDACTED] on [DATE]. This
over-read does not include interpretation of cardiac or coronary
anatomy or pathology. The coronary calcium score interpretation by
the cardiologist is attached.
CLINICAL DATA: Risk stratification
Coronary Calcium Score
TECHNIQUE: The patient was scanned on a Siemens Force scanner. Axial
non-contrast 3 mm slices were carried out through the heart. The
data set was analyzed on a dedicated work station and scored using
the Agatson method.

[Series 3: casc 3.0 i36f 2 bestdiast 65 % · axial · 0.34mm/px · z∈[-390,-304]mm · 8 of 39 slices shown, 10 images]
[im 5/39  vessel]
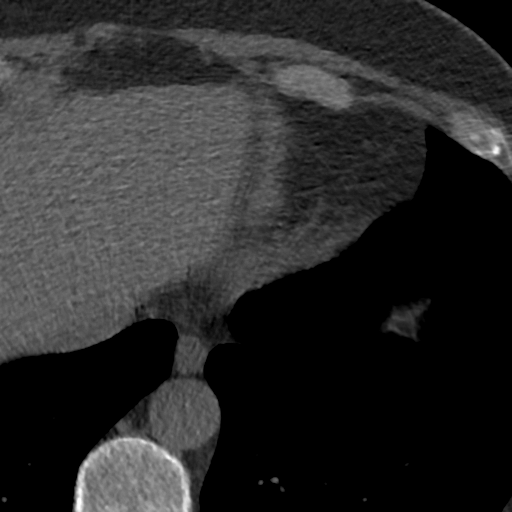
[im 5/39  lung]
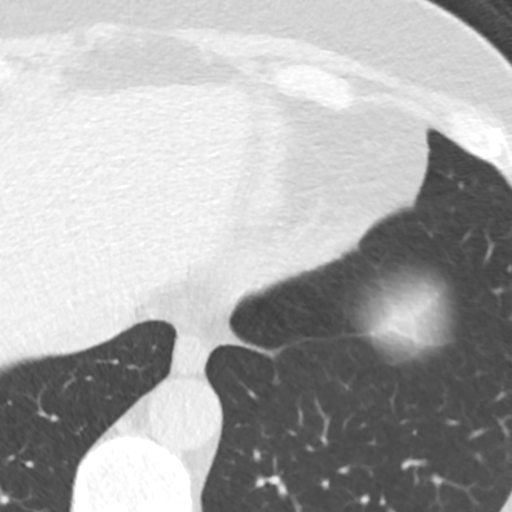
[im 9/39  vessel]
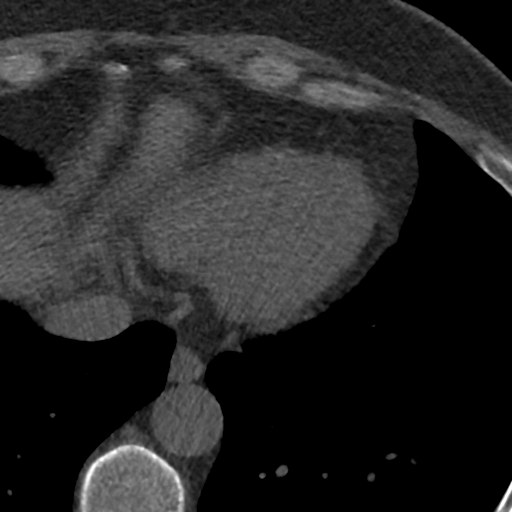
[im 13/39  vessel]
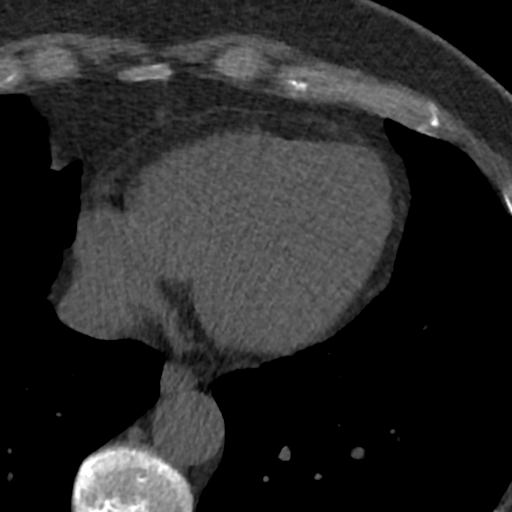
[im 17/39  vessel]
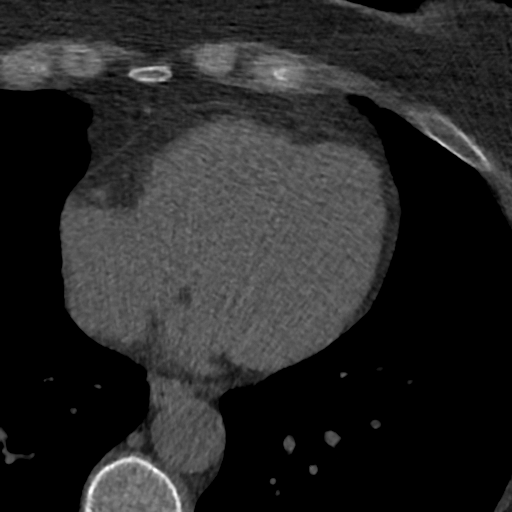
[im 22/39  vessel]
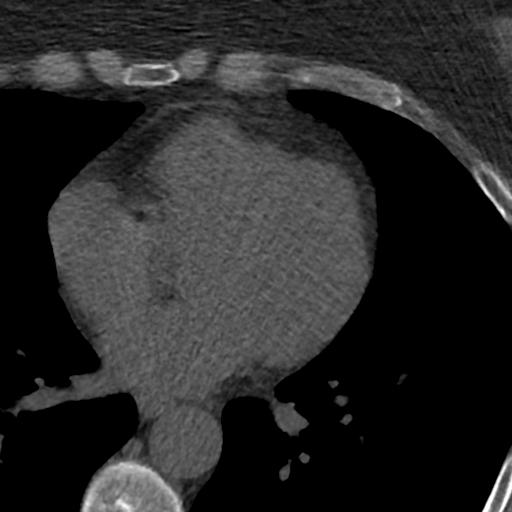
[im 22/39  lung]
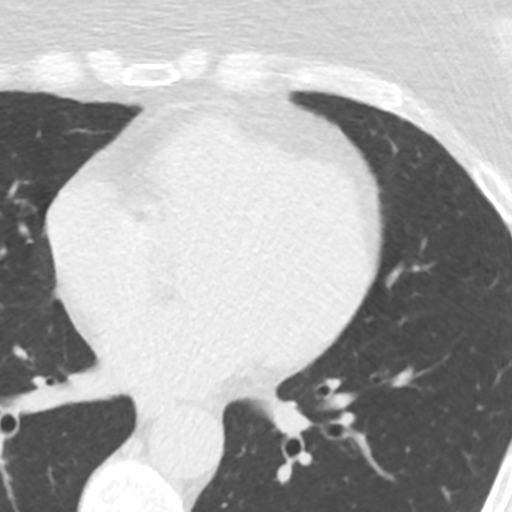
[im 26/39  vessel]
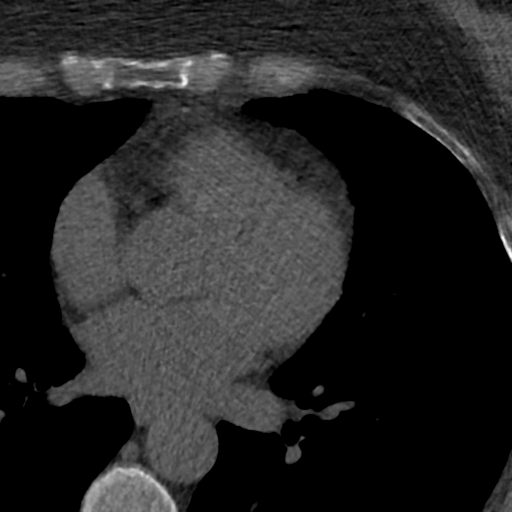
[im 30/39  vessel]
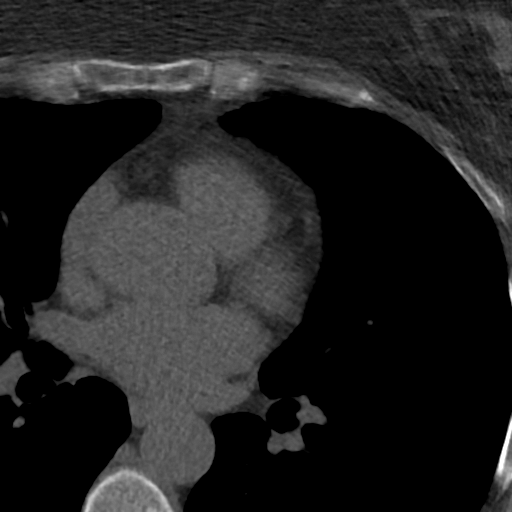
[im 34/39  vessel]
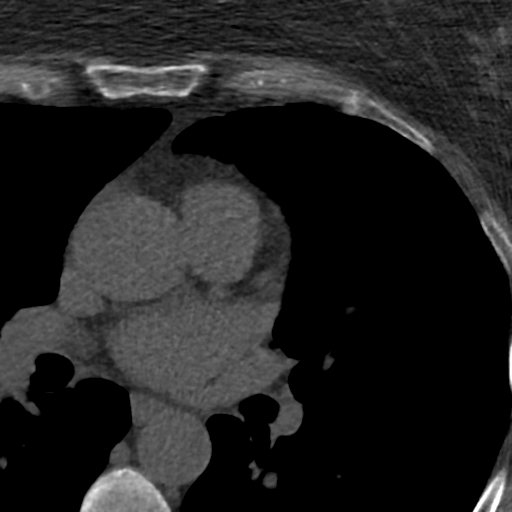

[Series 5: lung st 71 % · axial · 0.68mm/px · z∈[-390,-304]mm · 8 of 39 slices shown]
[im 5/39  lung]
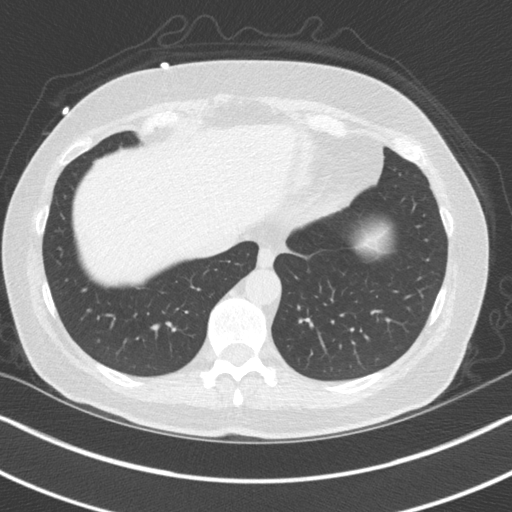
[im 9/39  lung]
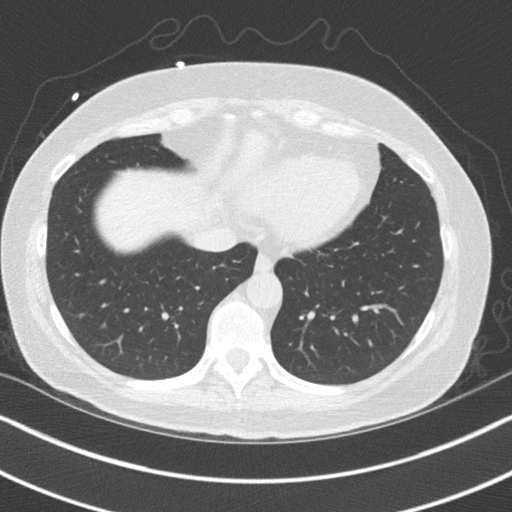
[im 13/39  lung]
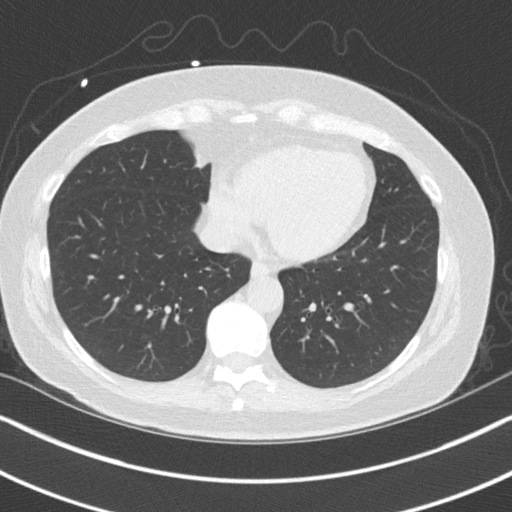
[im 17/39  lung]
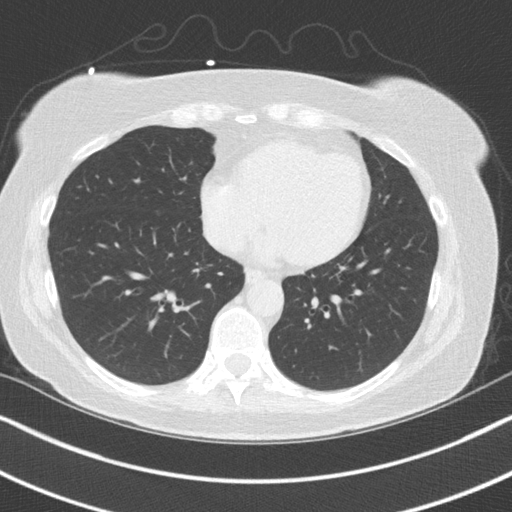
[im 22/39  lung]
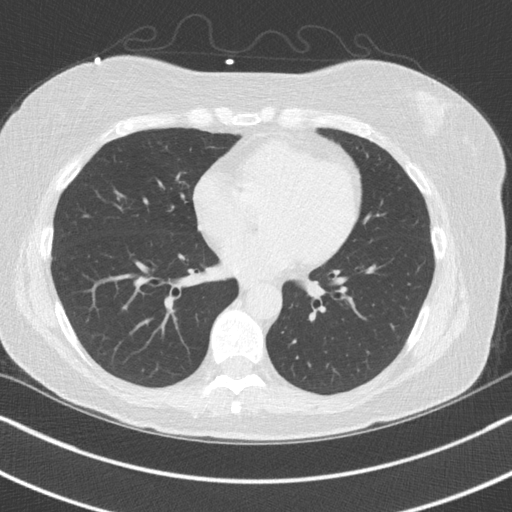
[im 26/39  lung]
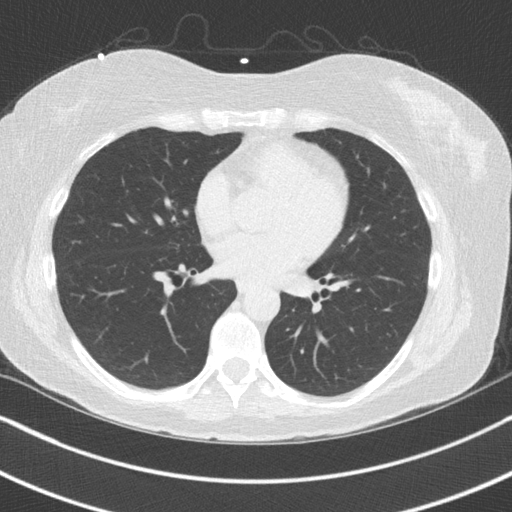
[im 30/39  lung]
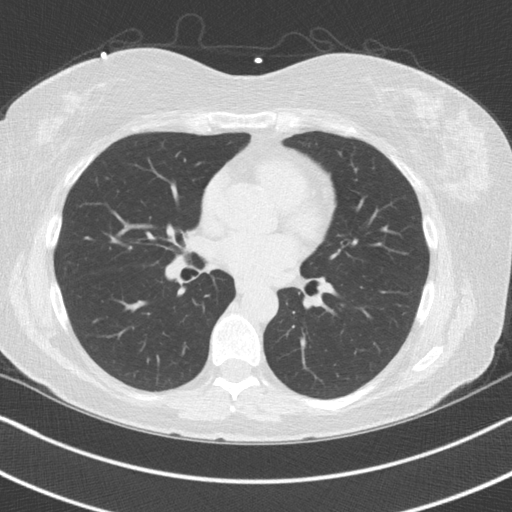
[im 34/39  lung]
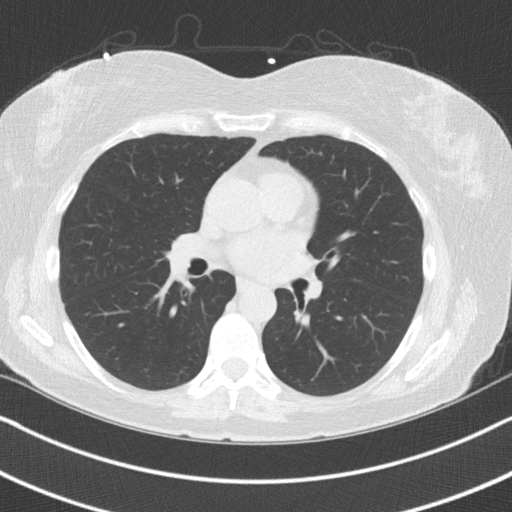

[16 of 20 positions shown; findings below may reference images not displayed]

FINDINGS: Within the visualized portions of the thorax there are no suspicious
appearing pulmonary nodules or masses, there is no acute
consolidative airspace disease, no pleural effusions, no
pneumothorax and no lymphadenopathy. Visualized portions of the
upper abdomen are unremarkable. There are no aggressive appearing
lytic or blastic lesions noted in the visualized portions of the
skeleton.
IMPRESSION: 1. No significant incidental noncardiac findings are noted.
FINDINGS: Non-cardiac: See separate report from [REDACTED].

Ascending Aorta: Normal size, no calcifications.

Pericardium: Normal.

Coronary arteries: Normal origin.
IMPRESSION: Coronary calcium score of 0. This was 0 percentile for age and sex
matched control.

ASEN

*** End of Addendum ***

## 2018-08-15 ENCOUNTER — Other Ambulatory Visit: Payer: Self-pay | Admitting: *Deleted

## 2018-08-15 ENCOUNTER — Ambulatory Visit (HOSPITAL_COMMUNITY)
Admission: RE | Admit: 2018-08-15 | Discharge: 2018-08-15 | Disposition: A | Discharge status: Home / Self Care | Payer: 59 | Source: Ambulatory Visit | Attending: Cardiology | Admitting: Cardiology

## 2018-08-15 DIAGNOSIS — R0602 Shortness of breath: Secondary | ICD-10-CM | POA: Insufficient documentation

## 2018-08-15 DIAGNOSIS — R072 Precordial pain: Secondary | ICD-10-CM | POA: Insufficient documentation

## 2018-08-15 LAB — EXERCISE TOLERANCE TEST
CSEPEW: 7 METS
CSEPPHR: 134 {beats}/min
Exercise duration (min): 5 min
Exercise duration (sec): 34 s
MPHR: 165 {beats}/min
Percent HR: 81 %
RPE: 18
Rest HR: 82 {beats}/min

## 2018-08-16 ENCOUNTER — Other Ambulatory Visit: Payer: Self-pay | Admitting: *Deleted

## 2018-08-17 ENCOUNTER — Telehealth (HOSPITAL_COMMUNITY): Payer: Self-pay | Admitting: *Deleted

## 2018-08-17 NOTE — Telephone Encounter (Signed)
Left message on voicemail per DPR in reference to upcoming appointment scheduled on 08/22/18 at 2:00 with detailed instructions given per Stress Test Requisition Sheet for the test. LM to arrive 30 minutes early, and that it is imperative to arrive on time for appointment to keep from having the test rescheduled. If you need to cancel or reschedule your appointment, please call the office within 24 hours of your appointment. Failure to do so may result in a cancellation of your appointment, and a $50 no show fee. Phone number given for call back for any questions. Veronia Beets

## 2018-08-22 ENCOUNTER — Ambulatory Visit (HOSPITAL_COMMUNITY): Payer: 59

## 2018-08-22 ENCOUNTER — Ambulatory Visit (HOSPITAL_COMMUNITY): Payer: 59 | Attending: Cardiology

## 2018-08-22 DIAGNOSIS — R072 Precordial pain: Secondary | ICD-10-CM | POA: Diagnosis present

## 2018-10-11 ENCOUNTER — Telehealth: Payer: Self-pay

## 2018-10-11 NOTE — Telephone Encounter (Signed)
Copied from [place] [id number]. Topic: General - Inquiry >> Oct 11, 2018 10:11 AM Virl Axe D wrote: Reason for CRM: Pt stated she had a fair amount of blood in her stool last night. She feels fine this morning but she does not know if she needs to be referred to a GI specialist or have a colonoscopy. She would like to know what Dr. Elease Hashimoto would like to do. Please advise. CB#[phone]

## 2018-10-11 NOTE — Telephone Encounter (Signed)
I called the pt and advised her she would need to be seen in the office for this problem.  Appt scheduled for 3/6 at 2:15pm.

## 2018-10-12 ENCOUNTER — Other Ambulatory Visit: Payer: Self-pay

## 2018-10-12 ENCOUNTER — Ambulatory Visit: Payer: 59 | Admitting: Family Medicine

## 2018-10-12 ENCOUNTER — Encounter: Payer: Self-pay | Admitting: Family Medicine

## 2018-10-12 VITALS — BP 120/70 | HR 74 | Temp 97.9°F | Ht 66.5 in | Wt 179.1 lb

## 2018-10-12 DIAGNOSIS — K921 Melena: Secondary | ICD-10-CM

## 2018-10-12 LAB — CBC WITH DIFFERENTIAL/PLATELET
BASOS PCT: 0.7 % (ref 0.0–3.0)
Basophils Absolute: 0.1 10*3/uL (ref 0.0–0.1)
EOS PCT: 1.4 % (ref 0.0–5.0)
Eosinophils Absolute: 0.1 10*3/uL (ref 0.0–0.7)
HCT: 40.2 % (ref 36.0–46.0)
Hemoglobin: 13.7 g/dL (ref 12.0–15.0)
Lymphocytes Relative: 31.9 % (ref 12.0–46.0)
Lymphs Abs: 2.8 10*3/uL (ref 0.7–4.0)
MCHC: 34.1 g/dL (ref 30.0–36.0)
MCV: 86.6 fl (ref 78.0–100.0)
Monocytes Absolute: 0.6 10*3/uL (ref 0.1–1.0)
Monocytes Relative: 6.9 % (ref 3.0–12.0)
Neutro Abs: 5.1 10*3/uL (ref 1.4–7.7)
Neutrophils Relative %: 59.1 % (ref 43.0–77.0)
Platelets: 367 10*3/uL (ref 150.0–400.0)
RBC: 4.64 Mil/uL (ref 3.87–5.11)
RDW: 14 % (ref 11.5–15.5)
WBC: 8.7 10*3/uL (ref 4.0–10.5)

## 2018-10-12 NOTE — Progress Notes (Signed)
  Subjective:     Patient ID: Kelly Gilbert, female   DOB: 01-20-1961, 58 y.o.   MRN: [id number]  HPI   Patient seen with episode of bright red blood per rectum Wednesday night with bowel movement.  She noticed some on the toilet tissue with wiping but then noticed some of the toilet bowl as well.  No vaginal bleeding.  Occasional constipation.  No recent pain with bowel movements.  She had colonoscopy 06/2013 with adenomatous polyp and a few hyperplastic polyps- as well as mention of internal hemorrhoids..  No recent appetite or weight changes.  No abdominal pain.   Past Medical History:  Diagnosis Date  . DEPRESSION 09/23/2009  . Lesion of bladder   . Migraine headache   . PONV (postoperative nausea and vomiting)    Past Surgical History:  Procedure Laterality Date  . BENIGN LEFT BREASET BX  1992  . CYSTOSCOPY WITH BIOPSY  01/16/2012   Procedure: CYSTOSCOPY WITH BIOPSY;  Surgeon: Molli Hazard, MD;  Location: [facility];  Service: Urology;  Laterality: N/A;  1 hour requested for this case  C-ARM CAMERA    . VAGINAL HYSTERECTOMY  1999   EPIDURAL ANES.    reports that she has been smoking cigarettes. She has a 8.75 pack-year smoking history. She has never used smokeless tobacco. She reports current alcohol use of about 2.0 standard drinks of alcohol per week. No history on file for drug. family history includes Cancer in her father; Cancer (age of onset: 29) in her mother; Hypertension in her father and maternal grandmother. Allergies  Allergen Reactions  . Codeine Sulfate Nausea And Vomiting  . Sulfa Antibiotics Hives and Swelling     Review of Systems  Constitutional: Negative for appetite change and unexpected weight change.  Gastrointestinal: Positive for blood in stool and constipation. Negative for abdominal pain and diarrhea.       Objective:   Physical Exam Cardiovascular:     Rate and Rhythm: Normal rate and regular rhythm.  Pulmonary:     Effort: Pulmonary effort is normal.     Breath sounds: Normal breath sounds.  Genitourinary:    Comments: No external hemorrhoids.  She does appear to have very small superficial fissure around the 5 o'clock position but no active bleeding.  Digital exam reveals no rectal mass.  Heme positive stool.  We did not attempt an anoscopy because of fissure       Assessment:     Hematochezia.  Patient has superficial fissure on exam which may be the source but no evidence for active bleeding.  She does have hx of internal hemorrhoids on prior study      Plan:     -Check CBC -Set up GI follow-up as patient has had prior adenomatous polyp and last colonoscopy 5 years ago- especially in setting of recent blood.  Eulas Post MD [facility] at [facility]

## 2018-10-12 NOTE — Telephone Encounter (Signed)
FYI

## 2018-10-12 NOTE — Patient Instructions (Signed)
Anal Fissure, Adult  An anal fissure is a small tear or crack in the tissue of the anus. Bleeding from a fissure usually stops on its own within a few minutes. However, bleeding will often occur again with each bowel movement until the fissure heals. What are the causes? This condition is usually caused by passing a large or hard stool (feces). Other causes include:  Constipation.  Frequent diarrhea.  Inflammatory bowel disease (Crohn's disease or ulcerative colitis).  Childbirth.  Infections.  Anal sex. What are the signs or symptoms? Symptoms of this condition include:  Bleeding from the rectum.  Small amounts of blood seen on your stool, on the toilet paper, or in the toilet after a bowel movement. The blood coats the outside of the stool and is not mixed with the stool.  Painful bowel movements.  Itching or irritation around the anus. How is this diagnosed? A health care provider may diagnose this condition by closely examining the anal area. An anal fissure can usually be seen with careful inspection. In some cases, a rectal exam may be performed, or a short tube (anoscope) may be used to examine the anal canal. How is this treated? Initial treatment for this condition may include:  Taking steps to avoid constipation. This may include making changes to your diet, such as increasing your intake of fiber or fluid.  Taking fiber supplements. These supplements can soften your stool to help make bowel movements easier. Your health care provider may also prescribe a stool softener if your stool is hard.  Taking sitz baths. This may help to heal the tear.  Using medicated creams or ointments. These may be prescribed to lessen discomfort. Treatments that are sometimes used if initial treatments do not work well or if the condition is more severe may include:  Botulinum injection.  Surgery to repair the fissure. Follow these instructions at home: Eating and  drinking   Avoid foods that may cause constipation, such as bananas, milk, and other dairy products.  Eat all fruits, except bananas.  Drink enough fluid to keep your urine pale yellow.  Eat foods that are high in fiber, such as beans, whole grains, and fresh fruits and vegetables. General instructions   Take over-the-counter and prescription medicines only as told by your health care provider.  Use creams or ointments only as told by your health care provider.  Keep the anal area clean and dry.  Take sitz baths as told by your health care provider. Do not use soap in the sitz baths.  Keep all follow-up visits as told by your health care provider. This is important. Contact a health care provider if you have:  More bleeding.  A fever.  Diarrhea that is mixed with blood.  Pain that continues.  Ongoing problems that are getting worse rather than better. Summary  An anal fissure is a small tear or crack in the tissue of the anus. This condition is usually caused by passing a large or hard stool (feces). Other causes include constipation and frequent diarrhea.  Initial treatment for this condition may include taking steps to avoid constipation, such as increasing your intake of fiber or fluid.  Follow instructions for care as told by your health care provider.  Contact your health care provider if you have more bleeding or your problem is getting worse rather than better.  Keep all follow-up visits as told by your health care provider. This is important. This information is not intended to replace advice given   to you by your health care provider. Make sure you discuss any questions you have with your health care provider. Document Released: 07/25/2005 Document Revised: 01/04/2018 Document Reviewed: 01/04/2018 Elsevier Interactive Patient Education  2019 [employer].  We are setting up GI referral for further evaluation.

## 2018-10-14 ENCOUNTER — Encounter: Payer: Self-pay | Admitting: Family Medicine

## 2019-03-27 ENCOUNTER — Other Ambulatory Visit: Payer: Self-pay | Admitting: Family Medicine

## 2019-06-27 ENCOUNTER — Other Ambulatory Visit: Payer: Self-pay

## 2019-06-27 DIAGNOSIS — Z20822 Contact with and (suspected) exposure to covid-19: Secondary | ICD-10-CM

## 2019-06-29 LAB — NOVEL CORONAVIRUS, NAA: SARS-CoV-2, NAA: NOT DETECTED

## 2019-07-03 ENCOUNTER — Other Ambulatory Visit: Payer: Self-pay | Admitting: Family Medicine

## 2019-08-13 ENCOUNTER — Encounter: Payer: Self-pay | Admitting: Family Medicine

## 2019-09-02 ENCOUNTER — Ambulatory Visit: Payer: Managed Care, Other (non HMO) | Attending: Internal Medicine

## 2019-09-02 DIAGNOSIS — Z20822 Contact with and (suspected) exposure to covid-19: Secondary | ICD-10-CM

## 2019-09-03 LAB — NOVEL CORONAVIRUS, NAA: SARS-CoV-2, NAA: NOT DETECTED

## 2019-09-16 ENCOUNTER — Other Ambulatory Visit: Payer: Self-pay

## 2019-09-16 ENCOUNTER — Encounter: Payer: Self-pay | Admitting: Internal Medicine

## 2019-09-16 ENCOUNTER — Telehealth (INDEPENDENT_AMBULATORY_CARE_PROVIDER_SITE_OTHER): Payer: Managed Care, Other (non HMO) | Admitting: Family Medicine

## 2019-09-16 DIAGNOSIS — Z20822 Contact with and (suspected) exposure to covid-19: Secondary | ICD-10-CM | POA: Diagnosis not present

## 2019-09-16 DIAGNOSIS — Z8669 Personal history of other diseases of the nervous system and sense organs: Secondary | ICD-10-CM

## 2019-09-16 DIAGNOSIS — R0789 Other chest pain: Secondary | ICD-10-CM | POA: Diagnosis not present

## 2019-09-16 DIAGNOSIS — D126 Benign neoplasm of colon, unspecified: Secondary | ICD-10-CM

## 2019-09-16 MED ORDER — IMIPRAMINE HCL 25 MG PO TABS: 25.0000 mg | ORAL_TABLET | Freq: Every day | ORAL | 3 refills | Status: DC

## 2019-09-16 NOTE — Progress Notes (Signed)
This visit type was conducted due to national recommendations for restrictions regarding the COVID-19 pandemic in an effort to limit this patient's exposure and mitigate transmission in our community.   Virtual Visit via Video Note  I connected with [name] on 09/16/19 at 11:00 AM EST by a video enabled telemedicine application and verified that I am speaking with the correct person using two identifiers.  Location patient: home Location provider:work or home office Persons participating in the virtual visit: patient, provider  I discussed the limitations of evaluation and management by telemedicine and the availability of in person appointments. The patient expressed understanding and agreed to proceed.   HPI: Kelly Gilbert called for the following items  History of migraine headaches.  She has been on imipramine 50 mg nightly for several years and has not had any migraines now in several years.  She denies any side effects from medication.  She has history of adenomatous polyp and also hyperplastic polyp by previous colonoscopy 2014.  She did have episode of bright red blood per rectum back in March and on exam had small anal fissure.  She is not had any blood whatsoever since then.  Her CBC was normal.  No appetite or weight changes.  Patient relates her husband had Covid 2 weeks ago.  She never had symptoms.  She was tested twice and negative both times.  She has no respiratory symptoms whatsoever at this time.  She does plan to get Covid vaccination when she is able to qualify for this  She still has intermittent episodes of atypical chest pain.  She had cardiac evaluation last year with nuclear stress test and stress echo which were unremarkable.  She is not having any exertional chest symptoms   ROS: See pertinent positives and negatives per HPI.  Past Medical History:  Diagnosis Date  . DEPRESSION 09/23/2009  . Lesion of bladder   . Migraine headache   . PONV (postoperative  nausea and vomiting)     Past Surgical History:  Procedure Laterality Date  . BENIGN LEFT BREASET BX  1992  . CYSTOSCOPY WITH BIOPSY  01/16/2012   Procedure: CYSTOSCOPY WITH BIOPSY;  Surgeon: Molli Hazard, MD;  Location: [facility];  Service: Urology;  Laterality: N/A;  1 hour requested for this case  [facility]    . VAGINAL HYSTERECTOMY  1999   EPIDURAL ANES.    Family History  Problem Relation Age of Onset  . Hypertension Father   . Cancer Father        prostate  . Hypertension Maternal Grandmother   . Cancer Mother 75       renal cell cancer  . Colon cancer Neg Hx   . Pancreatic cancer Neg Hx   . Stomach cancer Neg Hx     SOCIAL HX: Long history of smoking   Current Outpatient Medications:  .  amphetamine-dextroamphetamine (ADDERALL) 15 MG tablet, Take 1 tablet by mouth 2 (two) times daily. May refill in one month (Patient taking differently: Take 15 mg by mouth as needed. May refill in one month), Disp: 60 tablet, Rfl: 0 .  Aspirin-Acetaminophen-Caffeine (EXCEDRIN PO), Take by mouth. ON HOLD, Disp: , Rfl:  .  Biotin 5000 MCG CAPS, Take by mouth., Disp: , Rfl:  .  Diphenhydramine-Pseudoephed (BENADRYL ALLERGY/SINUS PO), Take by mouth., Disp: , Rfl:  .  estradiol (ESTRACE) 1 MG tablet, Take 1 tablet (1 mg total) by mouth daily. (Patient not taking: Reported on 10/12/2018), Disp: 30 tablet, Rfl:  2 .  imipramine (TOFRANIL) 25 MG tablet, Take 1 tablet (25 mg total) by mouth at bedtime., Disp: 90 tablet, Rfl: 3 .  meloxicam (MOBIC) 15 MG tablet, Take 1 tablet (15 mg total) by mouth daily. (Patient not taking: Reported on 10/12/2018), Disp: 30 tablet, Rfl: 3 .  omeprazole (PRILOSEC) 20 MG capsule, Take 20 mg by mouth daily., Disp: , Rfl:  .  triamcinolone cream (KENALOG) 0.1 %, Apply 1 application topically 2 (two) times daily. For 1-2 weeks., Disp: 45 g, Rfl: 0  EXAM:  VITALS per patient if applicable:  GENERAL: alert, oriented, appears well and in  no acute distress  HEENT: atraumatic, conjunttiva clear, no obvious abnormalities on inspection of external nose and ears  NECK: normal movements of the head and neck  LUNGS: on inspection no signs of respiratory distress, breathing rate appears normal, no obvious gross SOB, gasping or wheezing  CV: no obvious cyanosis  MS: moves all visible extremities without noticeable abnormality  PSYCH/NEURO: pleasant and cooperative, no obvious depression or anxiety, speech and thought processing grossly intact  ASSESSMENT AND PLAN:  Discussed the following assessment and plan:  #1 history of migraine headaches currently stable on prophylaxis with imipramine  -We discussed possible tapering to 25 mg to see if this prevents her headaches.  If she does well with this dose consider tapering back further by next year to 10 mg and then potentially off  #2 history of adenomatous colon polyp.  Patient is due for repeat colonoscopy  -Set up GI referral  #3 history of recent exposure to Covid.  Patient is husband was positive.  She has been totally asymptomatic with 2 - screening test  #4 atypical chest pain.  Previous cardiac work-up negative. -We recommend cardiology follow-up if she has persistent symptoms- in particular if she has any exertional chest symptoms     I discussed the assessment and treatment plan with the patient. The patient was provided an opportunity to ask questions and all were answered. The patient agreed with the plan and demonstrated an understanding of the instructions.   The patient was advised to call back or seek an in-person evaluation if the symptoms worsen or if the condition fails to improve as anticipated.     Carolann Littler, MD

## 2019-09-24 ENCOUNTER — Encounter (HOSPITAL_COMMUNITY): Payer: Self-pay | Admitting: Emergency Medicine

## 2019-09-24 ENCOUNTER — Emergency Department (HOSPITAL_COMMUNITY): Payer: Managed Care, Other (non HMO)

## 2019-09-24 ENCOUNTER — Emergency Department (HOSPITAL_COMMUNITY)
Admission: EM | Admit: 2019-09-24 | Discharge: 2019-09-24 | Disposition: A | Discharge status: Home / Self Care | Payer: Managed Care, Other (non HMO) | Attending: Emergency Medicine | Admitting: Emergency Medicine

## 2019-09-24 ENCOUNTER — Other Ambulatory Visit: Payer: Self-pay

## 2019-09-24 DIAGNOSIS — R0789 Other chest pain: Secondary | ICD-10-CM | POA: Diagnosis not present

## 2019-09-24 DIAGNOSIS — F17210 Nicotine dependence, cigarettes, uncomplicated: Secondary | ICD-10-CM | POA: Insufficient documentation

## 2019-09-24 DIAGNOSIS — R002 Palpitations: Secondary | ICD-10-CM | POA: Diagnosis not present

## 2019-09-24 DIAGNOSIS — Z79899 Other long term (current) drug therapy: Secondary | ICD-10-CM | POA: Insufficient documentation

## 2019-09-24 DIAGNOSIS — R2689 Other abnormalities of gait and mobility: Secondary | ICD-10-CM | POA: Insufficient documentation

## 2019-09-24 DIAGNOSIS — R202 Paresthesia of skin: Secondary | ICD-10-CM

## 2019-09-24 LAB — BASIC METABOLIC PANEL
Anion gap: 8 (ref 5–15)
BUN: 15 mg/dL (ref 6–20)
CO2: 28 mmol/L (ref 22–32)
Calcium: 9.4 mg/dL (ref 8.9–10.3)
Chloride: 106 mmol/L (ref 98–111)
Creatinine, Ser: 0.79 mg/dL (ref 0.44–1.00)
GFR calc Af Amer: >60 mL/min (ref >60)
GFR calc non Af Amer: >60 mL/min (ref >60)
Glucose, Bld: 93 mg/dL (ref 70–99)
Potassium: 3.9 mmol/L (ref 3.5–5.1)
Sodium: 142 mmol/L (ref 135–145)

## 2019-09-24 LAB — CBC
HCT: 39.3 % (ref 36.0–46.0)
Hemoglobin: 12.6 g/dL (ref 12.0–15.0)
MCH: 29 pg (ref 26.0–34.0)
MCHC: 32.1 g/dL (ref 30.0–36.0)
MCV: 90.6 fL (ref 80.0–100.0)
Platelets: 343 10*3/uL (ref 150–400)
RBC: 4.34 MIL/uL (ref 3.87–5.11)
RDW: 14 % (ref 11.5–15.5)
WBC: 7.1 10*3/uL (ref 4.0–10.5)
nRBC: 0 % (ref 0.0–0.2)

## 2019-09-24 LAB — I-STAT BETA HCG BLOOD, ED (MC, WL, AP ONLY): I-stat hCG, quantitative: <5 m[IU]/mL (ref <5)

## 2019-09-24 LAB — TROPONIN I (HIGH SENSITIVITY): Troponin I (High Sensitivity): 3 ng/L (ref <18)

## 2019-09-24 IMAGING — CT CT HEAD W/O CM
3 series · 15 of 47 positions shown, 18 images · non-contrast
Comparison: None.

CLINICAL DATA: Left hand numbness and tingling, imbalance

EXAM:
CT HEAD WITHOUT CONTRAST
CT CERVICAL SPINE WITHOUT CONTRAST
TECHNIQUE: Multidetector CT imaging of the head and cervical spine was
performed following the standard protocol without intravenous
contrast. Multiplanar CT image reconstructions of the cervical spine
were also generated.

[Series 3: head wo · axial · 0.42mm/px · z∈[-134,-4]mm · 9 of 32 slices shown, 12 images]
[im 3/32  brain]
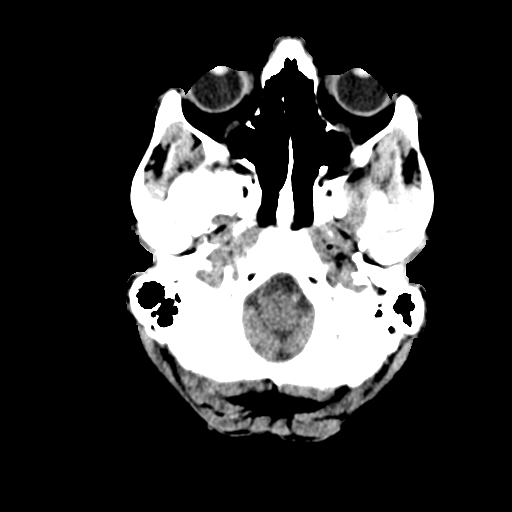
[im 3/32  bone]
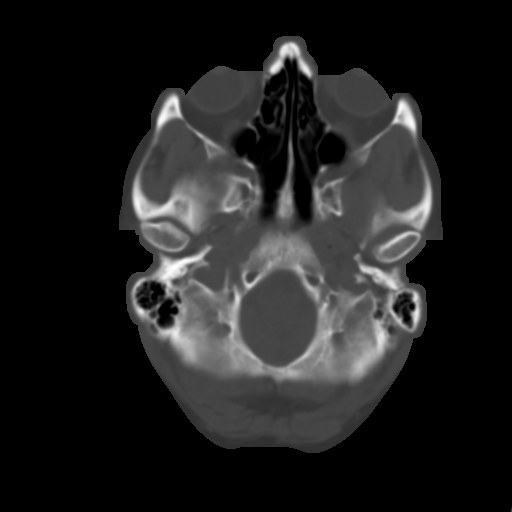
[im 6/32  brain]
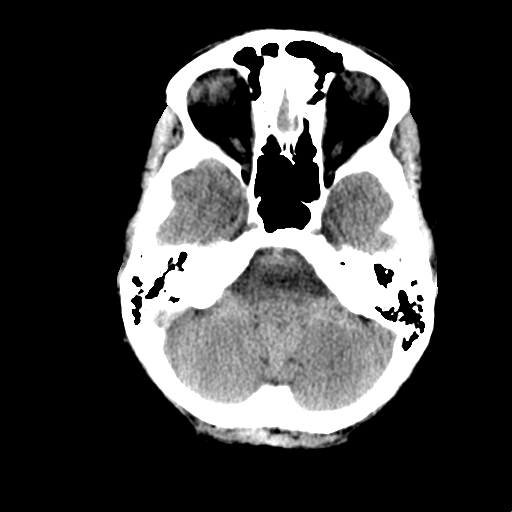
[im 9/32  brain]
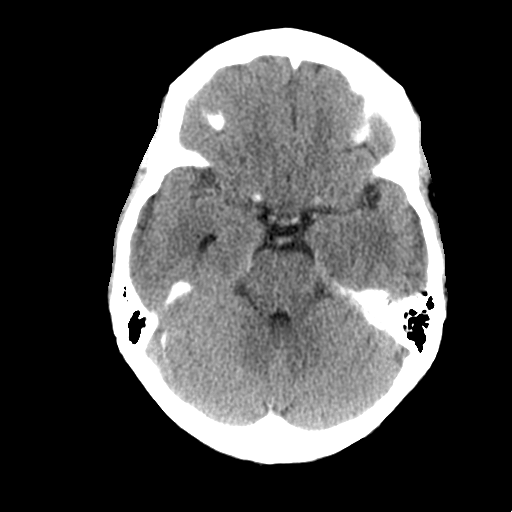
[im 12/32  brain]
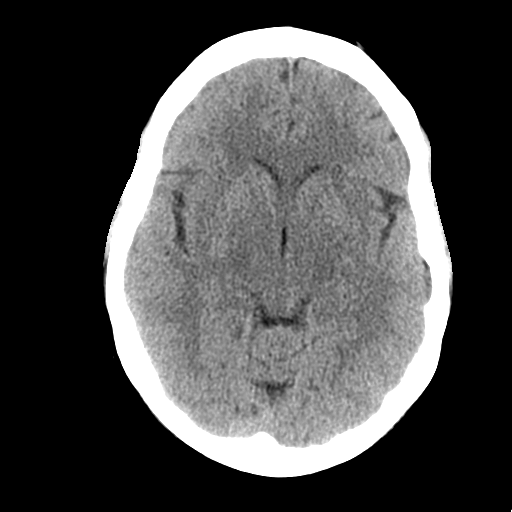
[im 17/32  brain]
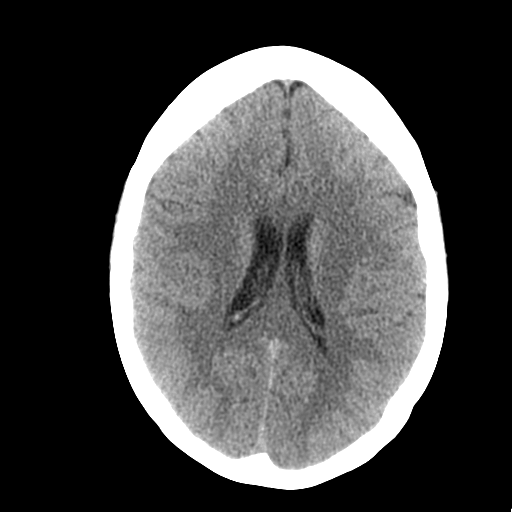
[im 17/32  bone]
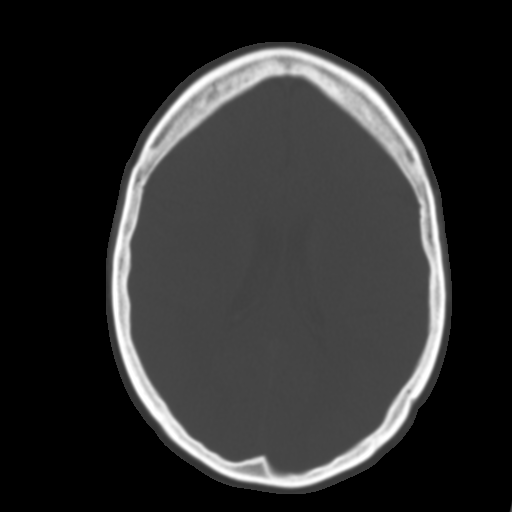
[im 20/32  brain]
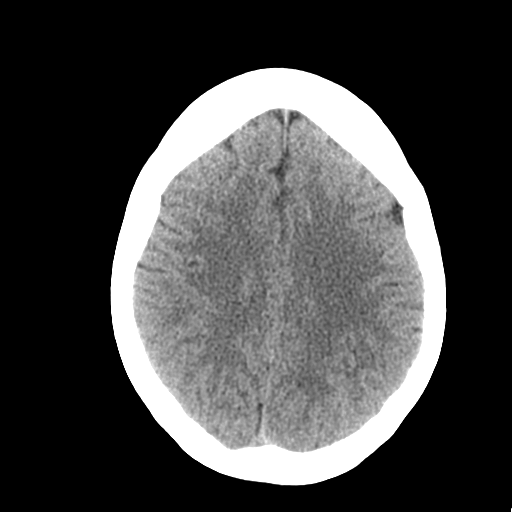
[im 23/32  brain]
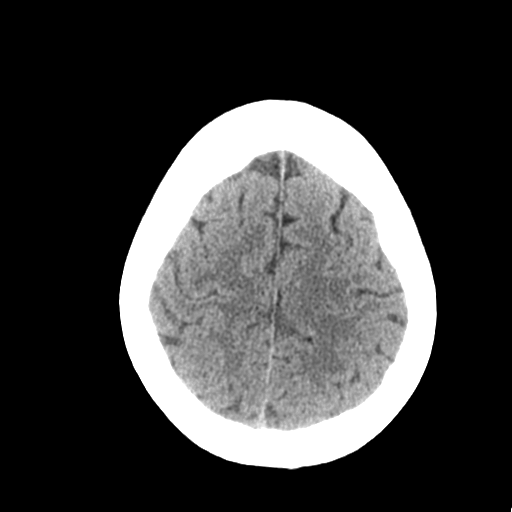
[im 26/32  brain]
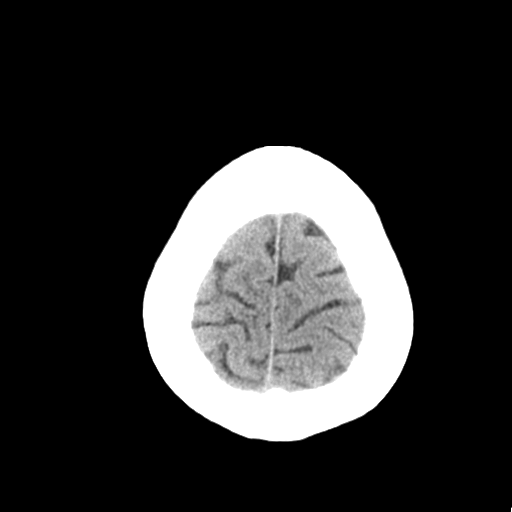
[im 29/32  brain]
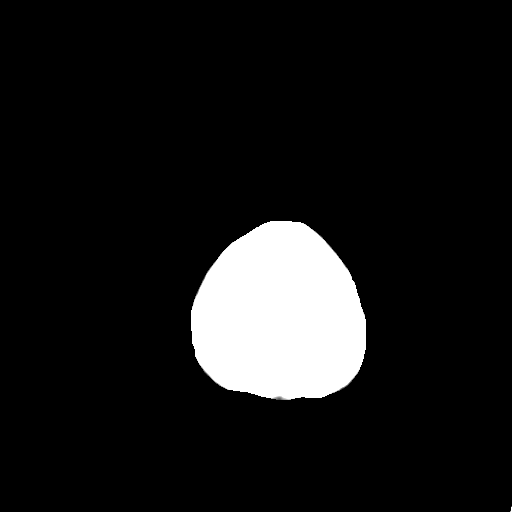
[im 29/32  bone]
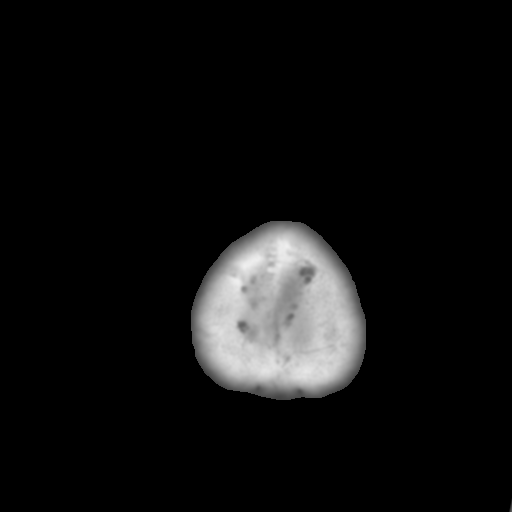

[Series 6: coronal soft tissue · coronal · 0.31mm/px · 3 of 67 slices shown]
[im 23/67  brain]
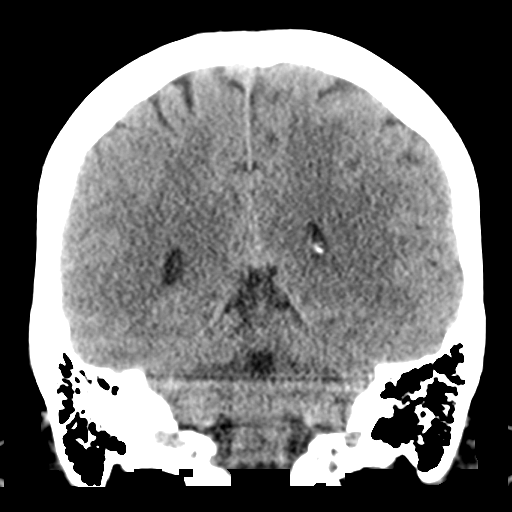
[im 30/67  brain]
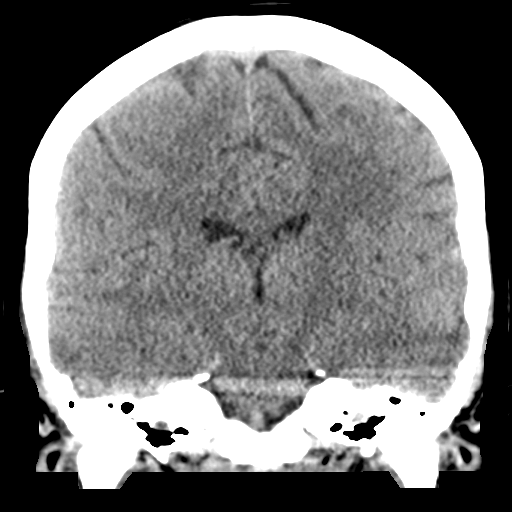
[im 37/67  brain]
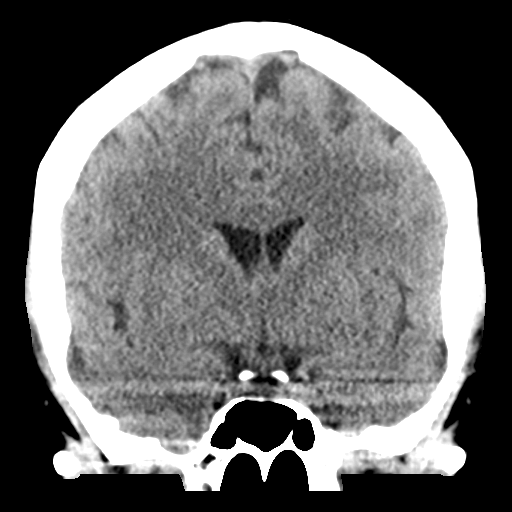

[Series 7: sagittal soft tissue · sagittal · 0.31mm/px · 3 of 53 slices shown]
[im 18/53  brain]
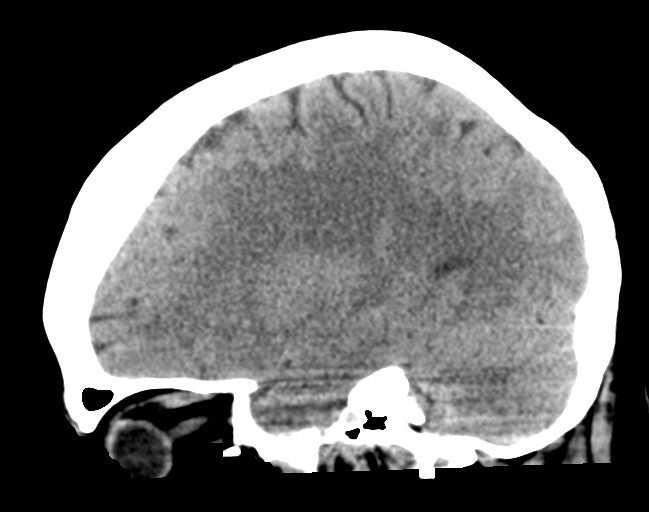
[im 27/53  brain]
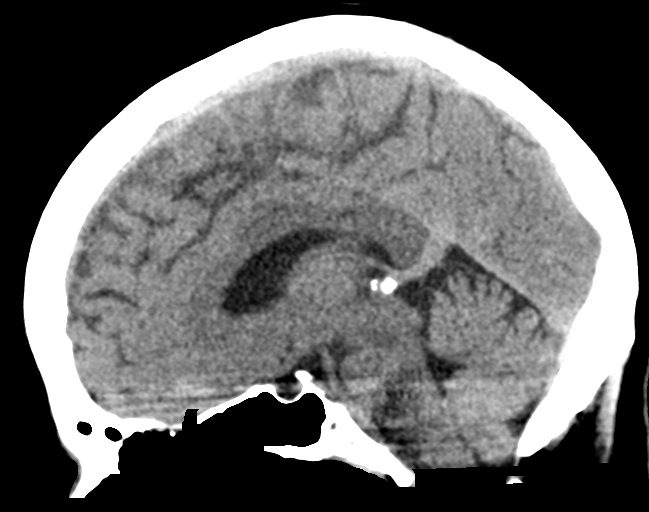
[im 35/53  brain]
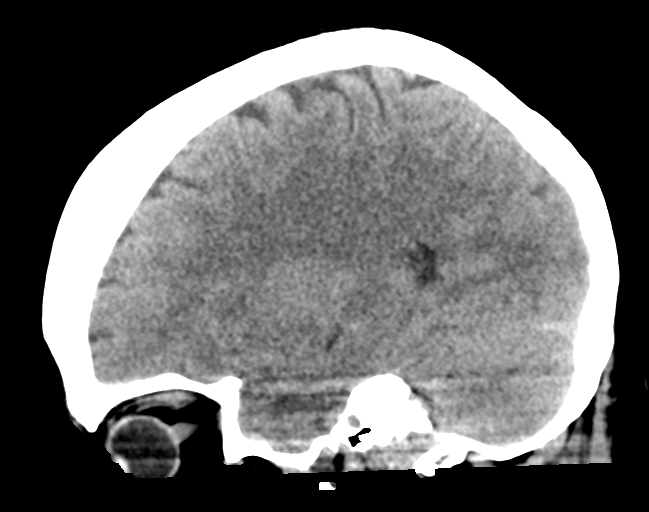

[15 of 47 positions shown; findings below may reference images not displayed]

FINDINGS: CT HEAD FINDINGS

Brain: No evidence of acute infarction, hemorrhage, hydrocephalus,
extra-axial collection or mass lesion/mass effect.

Vascular: No hyperdense vessel or unexpected calcification.

Skull: Normal. Negative for fracture or focal lesion.

Sinuses/Orbits: No acute finding.

Other: None.

CT CERVICAL SPINE FINDINGS

Alignment: Normal.

Skull base and vertebrae: No acute fracture. No primary bone lesion
or focal pathologic process.

Soft tissues and spinal canal: No prevertebral fluid or swelling. No
visible canal hematoma.

Disc levels: Mild multilevel disc space height loss and
osteophytosis of the lower cervical spine.

Upper chest: Negative.

Other: None.
IMPRESSION: 1. No acute intracranial pathology.
2. No fracture or static subluxation of the cervical spine.
3. Mild multilevel disc space height loss and osteophytosis of the
lower cervical spine. Cervical disc and neural foraminal pathology
may be further evaluated by MRI if indicated by localizing signs and
symptoms.

## 2019-09-24 IMAGING — CR DG CHEST 2V
2 series · 2 of 2 positions shown · non-contrast
Comparison: [DATE]

CLINICAL DATA: Chest pain

EXAM:
CHEST - 2 VIEW

[w chest pa]
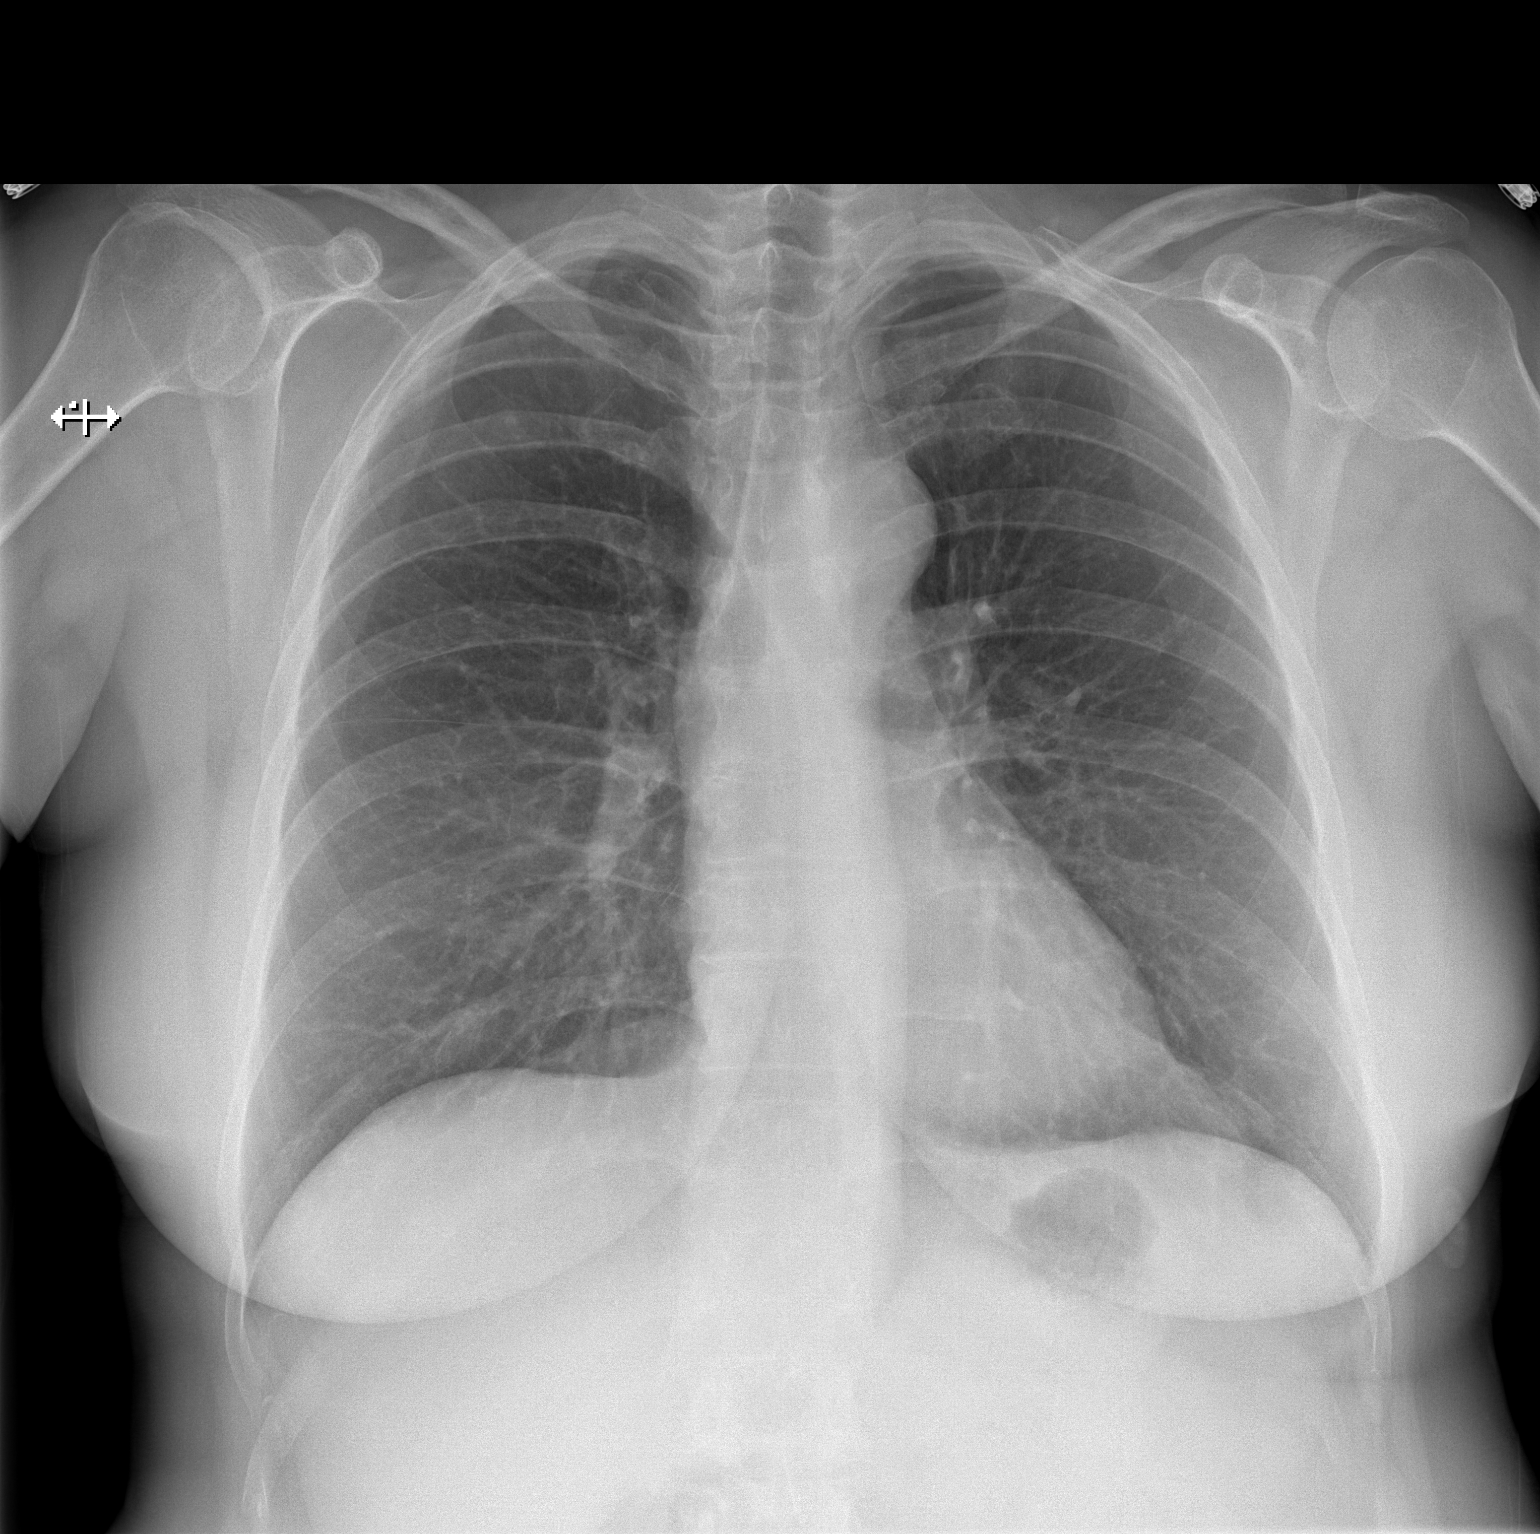

[w chest lat]
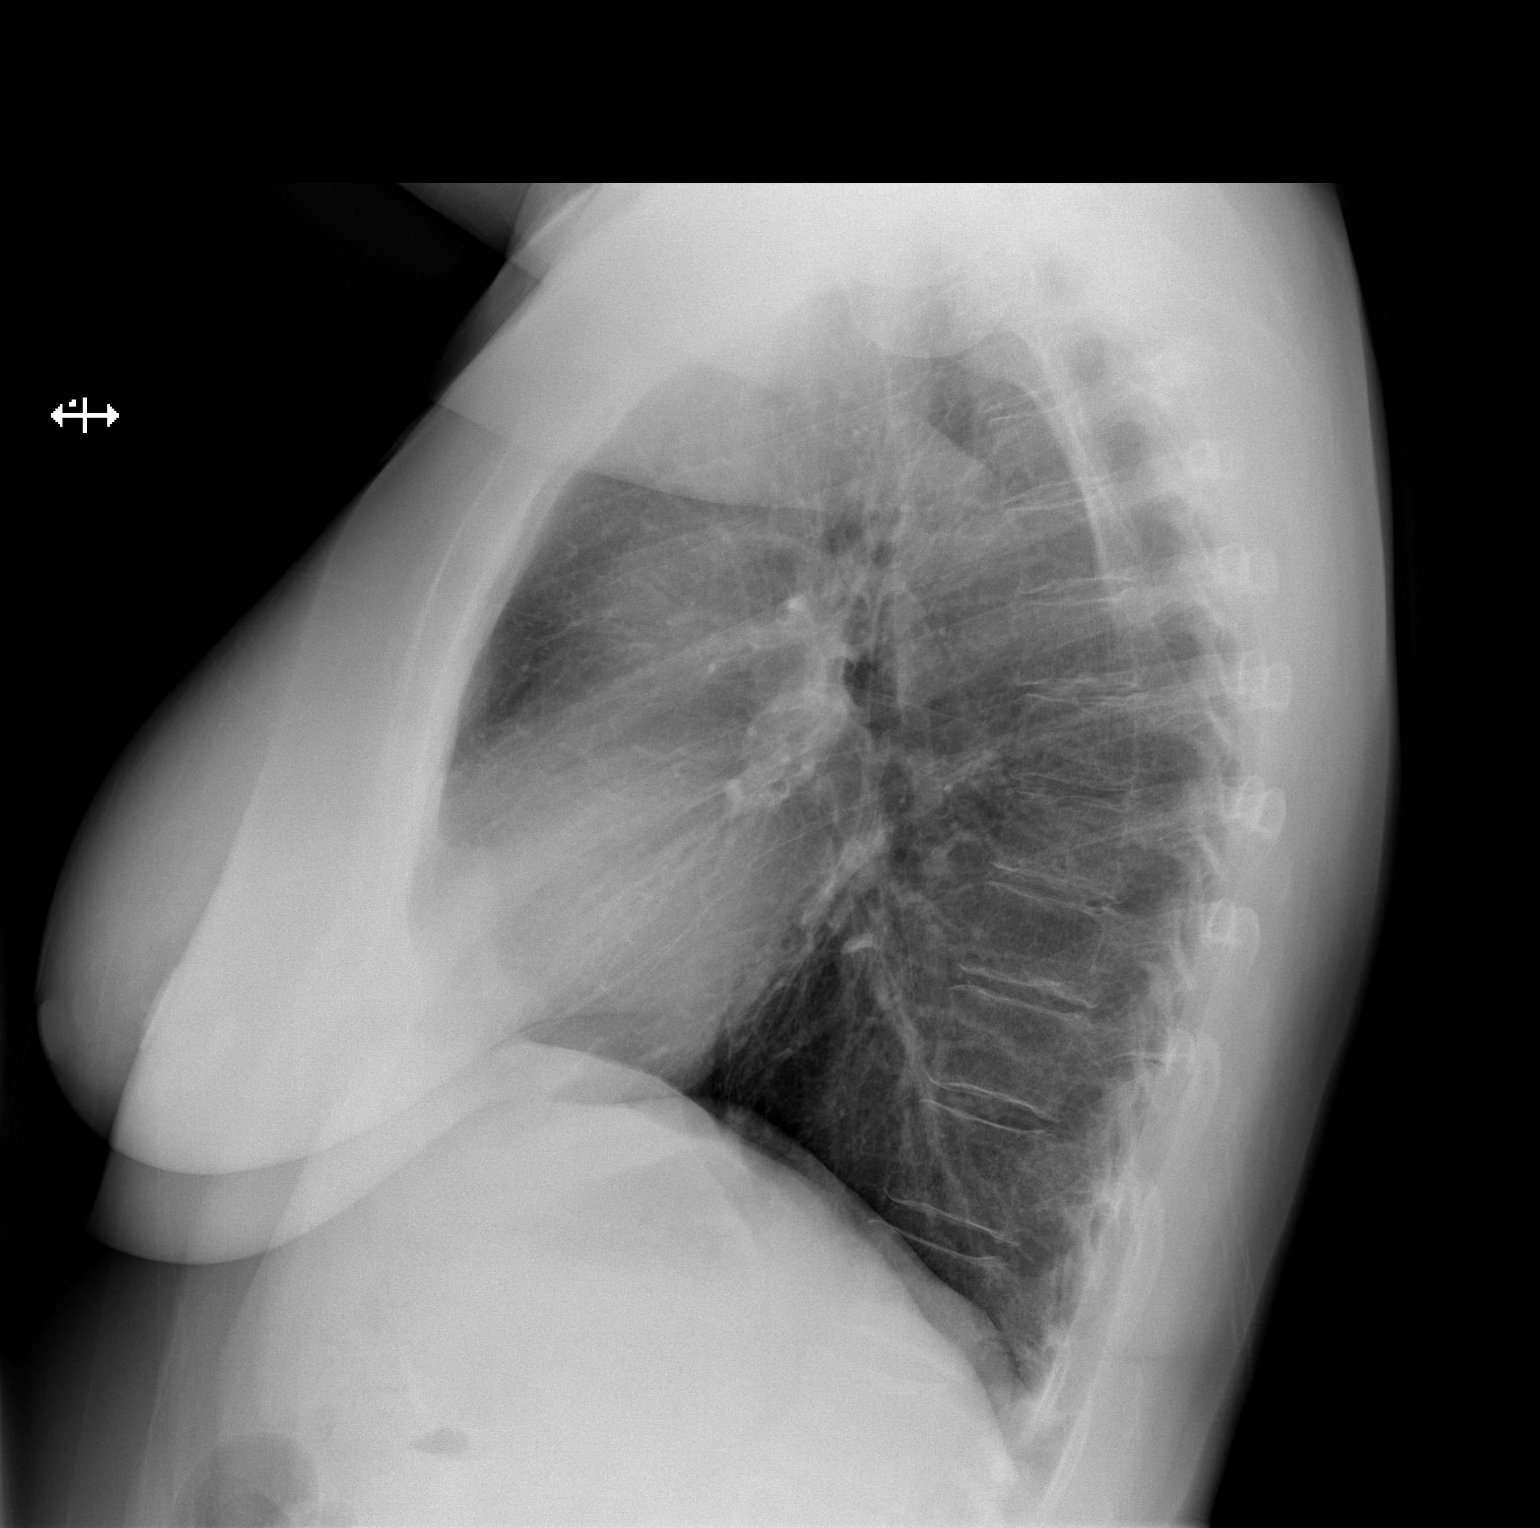

[2 of 2 positions shown; findings below may reference images not displayed]

FINDINGS: The heart size and mediastinal contours are within normal limits.
Both lungs are clear. The visualized skeletal structures are
unremarkable.
IMPRESSION: No active cardiopulmonary disease.

## 2019-09-24 IMAGING — CT CT CERVICAL SPINE W/O CM
3 of 4 series · 9 of 33 positions shown, 10 images · non-contrast
Comparison: None.

CLINICAL DATA: Left hand numbness and tingling, imbalance

EXAM:
CT HEAD WITHOUT CONTRAST
CT CERVICAL SPINE WITHOUT CONTRAST
TECHNIQUE: Multidetector CT imaging of the head and cervical spine was
performed following the standard protocol without intravenous
contrast. Multiplanar CT image reconstructions of the cervical spine
were also generated.

[Series 6: orthogonal bone · axial · 0.18mm/px · z∈[-235,-235]mm · 1 of 106 slices shown, 2 images]
[im 53/106  soft-tissue]
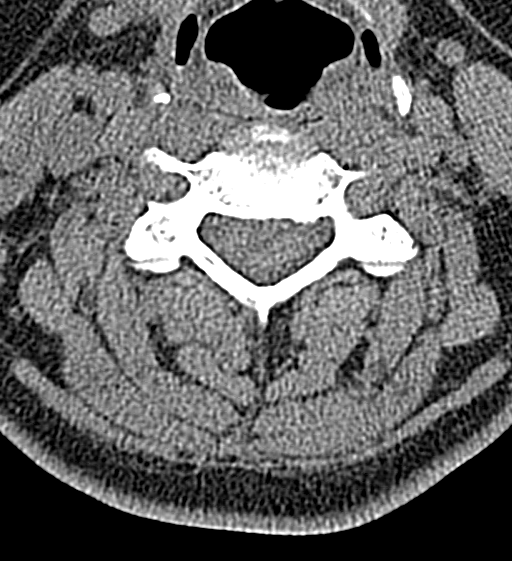
[im 53/106  bone]
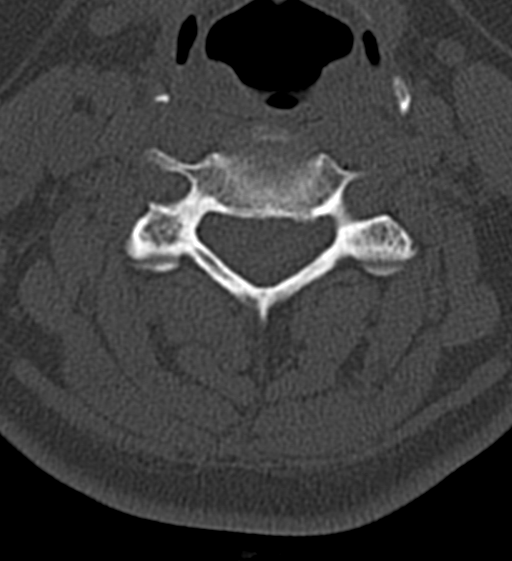

[Series 7: coronal bone · coronal · 0.18mm/px · 3 of 52 slices shown]
[im 11/52  bone]
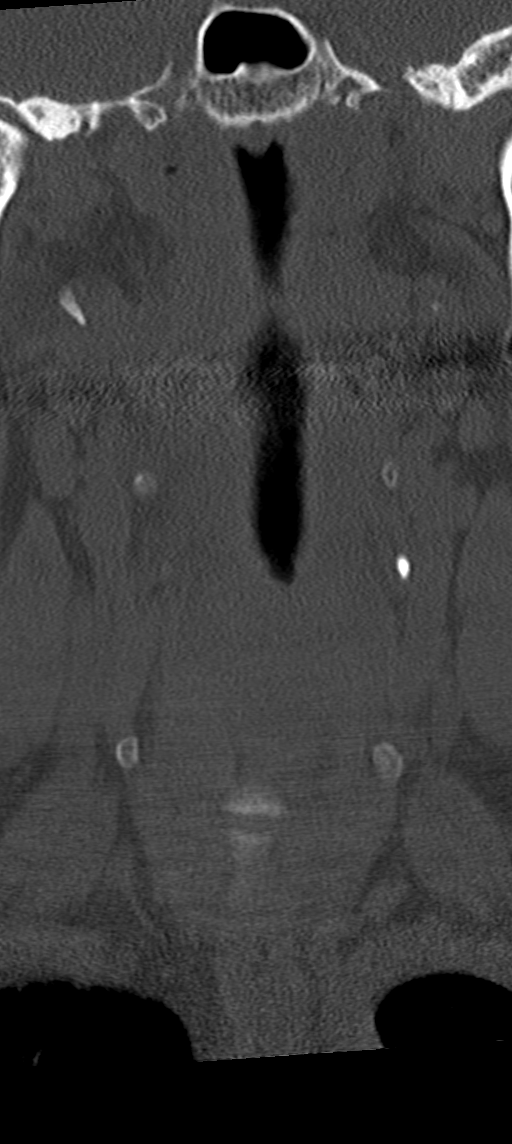
[im 21/52  bone]
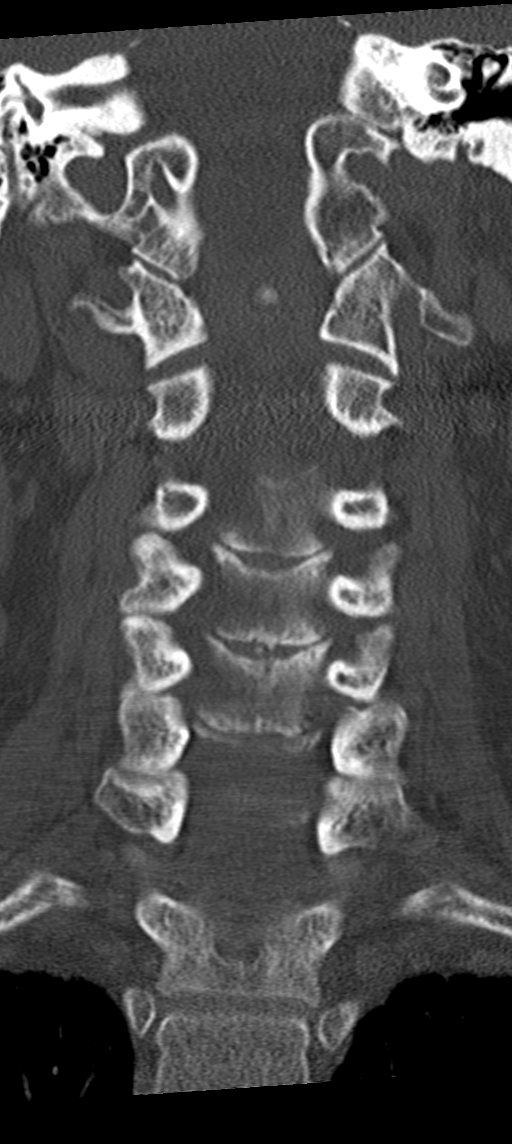
[im 31/52  bone]
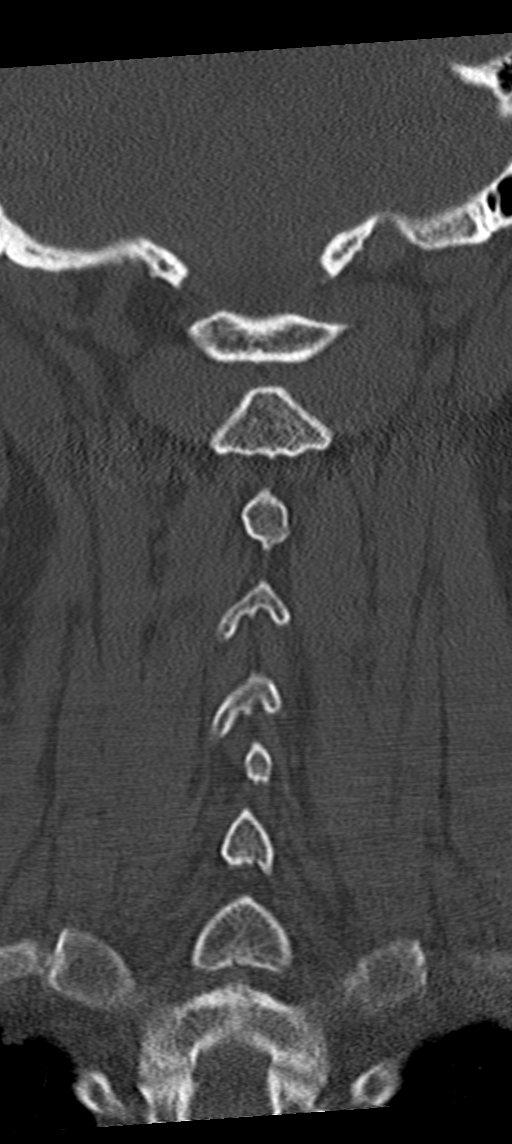

[Series 8: sagittal bone · sagittal · 0.20mm/px · 5 of 48 slices shown]
[im 16/48  bone]
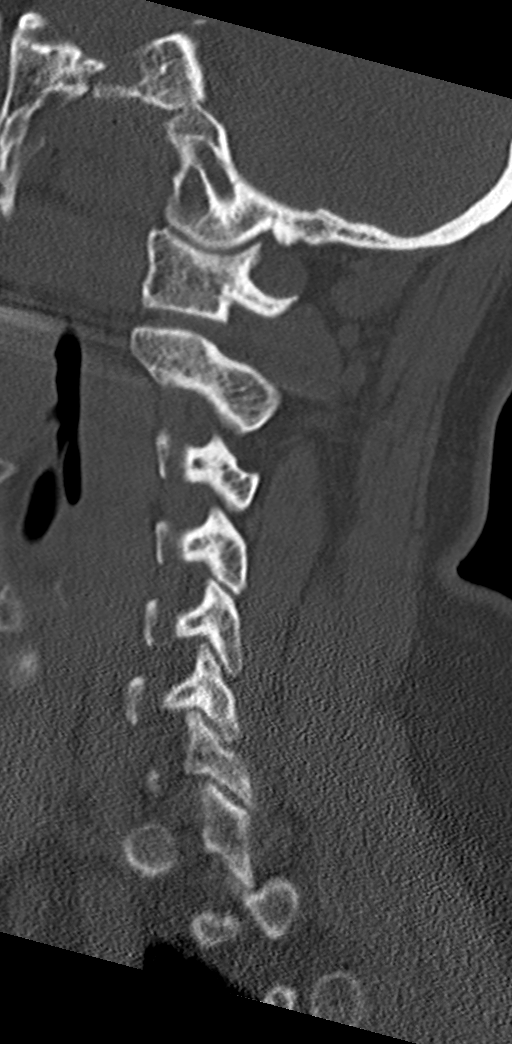
[im 20/48  bone]
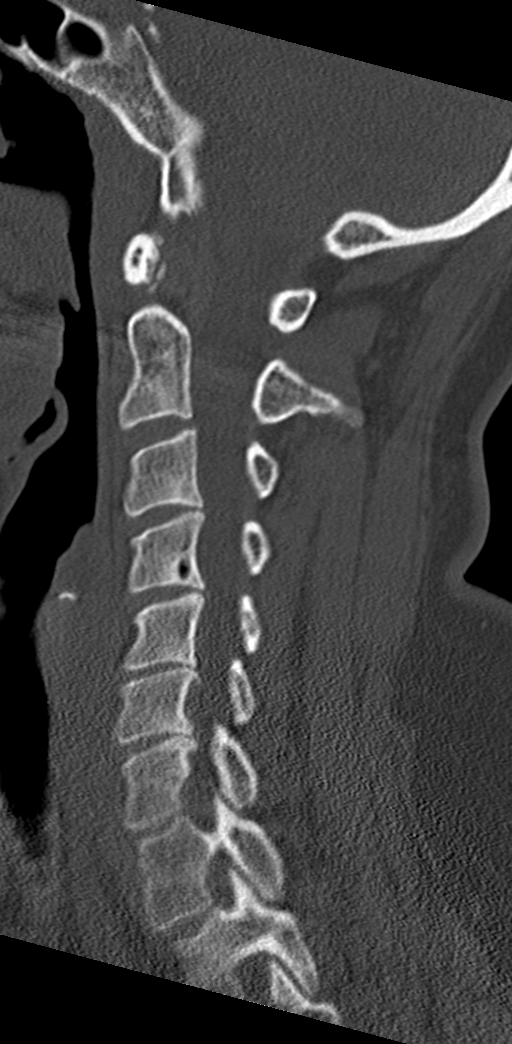
[im 24/48  bone]
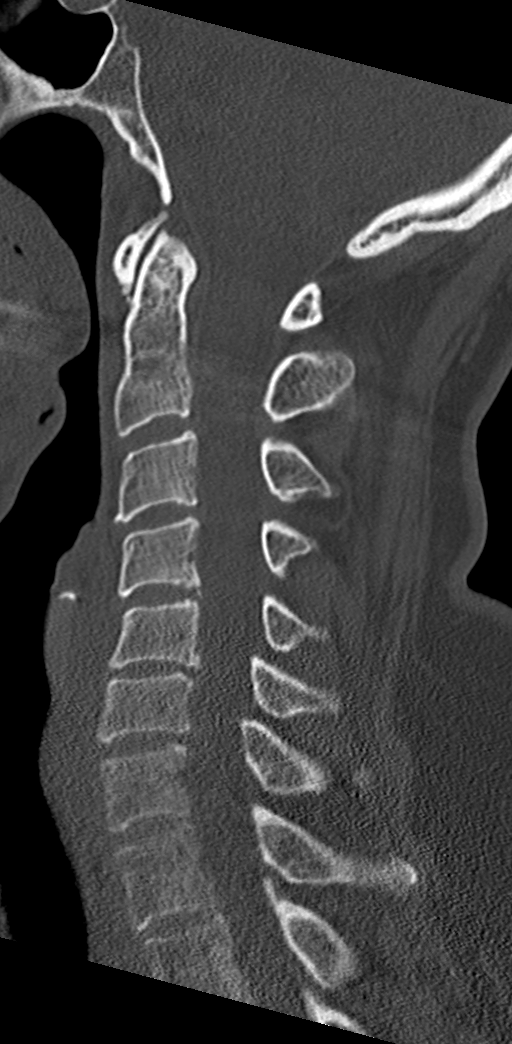
[im 28/48  bone]
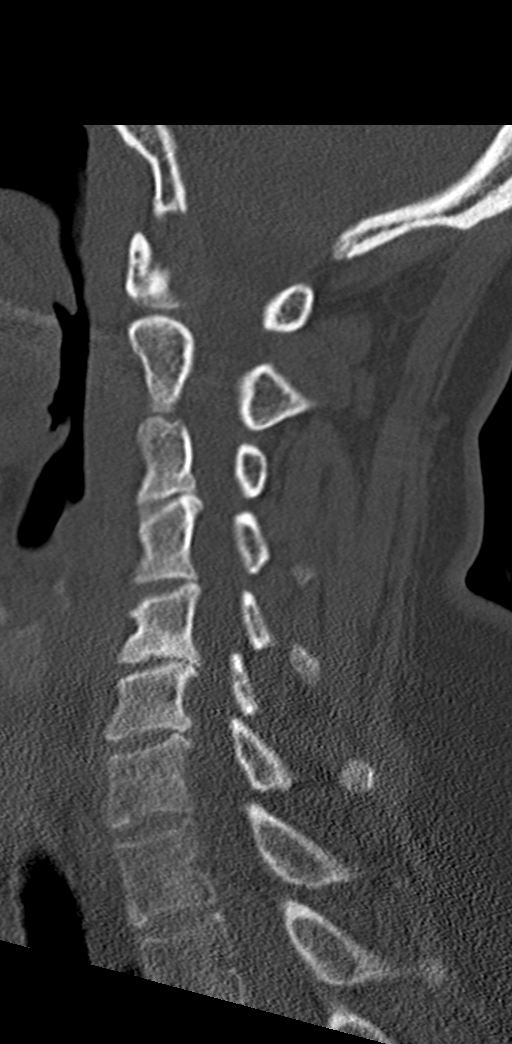
[im 32/48  bone]
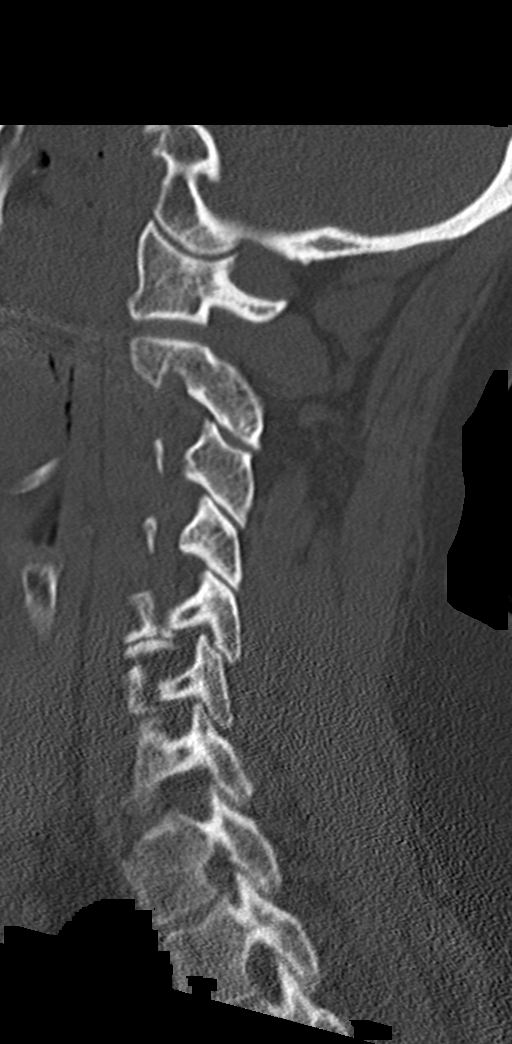

[9 of 33 positions shown; findings below may reference images not displayed]

FINDINGS: CT HEAD FINDINGS

Brain: No evidence of acute infarction, hemorrhage, hydrocephalus,
extra-axial collection or mass lesion/mass effect.

Vascular: No hyperdense vessel or unexpected calcification.

Skull: Normal. Negative for fracture or focal lesion.

Sinuses/Orbits: No acute finding.

Other: None.

CT CERVICAL SPINE FINDINGS

Alignment: Normal.

Skull base and vertebrae: No acute fracture. No primary bone lesion
or focal pathologic process.

Soft tissues and spinal canal: No prevertebral fluid or swelling. No
visible canal hematoma.

Disc levels: Mild multilevel disc space height loss and
osteophytosis of the lower cervical spine.

Upper chest: Negative.

Other: None.
IMPRESSION: 1. No acute intracranial pathology.
2. No fracture or static subluxation of the cervical spine.
3. Mild multilevel disc space height loss and osteophytosis of the
lower cervical spine. Cervical disc and neural foraminal pathology
may be further evaluated by MRI if indicated by localizing signs and
symptoms.

## 2019-09-24 NOTE — ED Triage Notes (Signed)
Pt reports for the past year had intermittent central chest pressure. Reports that yesterday afternoon started having left index finger and tingling in her left hand.

## 2019-09-24 NOTE — ED Notes (Signed)
Patient  Has a extra blue and gold top in the main lab

## 2019-09-24 NOTE — ED Provider Notes (Signed)
[facility] DEPT Provider Note   CSN: [id number] Arrival date & time: 09/24/19  1528     History Chief Complaint  Patient presents with   Chest Pain   finger numbness    Kelly Gilbert is a 59 y.o. female.  The history is provided by the patient and medical records. No language interpreter was used.  Chest Pain  Kelly Gilbert is a 59 y.o. female who presents to the Emergency Department complaining of finger numbness. She presents the emergency department complaining of numbness to her left index finger and thumb. Symptoms began yesterday. She also has a tingling sensation on the radial aspect of her left hand, arm. The numbness comes and goes to the arm part but is persistent in the fingers. No reports of injuries. She also complains of intermittent chest pressure and shortness of breath that has been ongoing for the last year. Is unchanged from baseline. She did have a evaluation by cardiology for similar chest pressure and had a negative stress test and coronary calcium CT. She does have a mild dry cough that she attributes to smoking. She has chronic occasional headaches, last headache was a few days ago. She also has chronic neck pain, unchanged from baseline. She denies any fevers, shortness of breath over baseline, nausea, vomiting, abdominal pain, leg swelling or pain. She does not take hormones but did previously take estradiol, this was discontinued two months ago. No history of blood clots.    Past Medical History:  Diagnosis Date   DEPRESSION 09/23/2009   Lesion of bladder    Migraine headache    PONV (postoperative nausea and vomiting)     Patient Active Problem List   Diagnosis Date Noted   Left hand paresthesia 07/02/2013   ADD (attention deficit disorder) 03/18/2013   Hematuria 03/18/2013   History of migraine 10/12/2011   DEPRESSION 09/23/2009   GERD 09/23/2009    Past Surgical History:  Procedure Laterality Date     BENIGN LEFT BREASET [place] WITH BIOPSY  01/16/2012   Procedure: CYSTOSCOPY WITH BIOPSY;  Surgeon: Molli Hazard, MD;  Location: [facility];  Service: Urology;  Laterality: N/A;  1 hour requested for this case  [place].     OB History   No obstetric history on file.     Family History  Problem Relation Age of Onset   Hypertension Father    Cancer Father        prostate   Hypertension Maternal Grandmother    Cancer Mother 82       renal cell cancer   Colon cancer Neg Hx    Pancreatic cancer Neg Hx    Stomach cancer Neg Hx     Social History   Tobacco Use   Smoking status: Current Every Day Smoker    Packs/day: 0.25    Years: 35.00    Pack years: 8.75    Types: Cigarettes   Smokeless tobacco: Never Used   Tobacco comment: QUIT SMOKING 2008--  RECENTLY STARTED BACK 7-8 CIG. PER DAY  Substance Use Topics   Alcohol use: Yes    Alcohol/week: 2.0 standard drinks    Types: 2 Shots of liquor per week    Comment: Occasional   Drug use: Not on file    Home Medications Prior to Admission medications   Medication Sig Start Date End Date Taking? Authorizing  Provider  amphetamine-dextroamphetamine (ADDERALL) 15 MG tablet Take 1 tablet by mouth 2 (two) times daily. May refill in one month Patient taking differently: Take 15 mg by mouth daily as needed (adhd). May refill in one month 01/24/17  Yes Burchette, Alinda Sierras, MD  Aspirin-Acetaminophen-Caffeine (EXCEDRIN PO) Take 1 tablet by mouth daily as needed (headache).    Yes [provider]  Biotin 5000 MCG CAPS Take 1 capsule by mouth every evening.    Yes [provider]  cholecalciferol (VITAMIN D3) 25 MCG (1000 UNIT) tablet Take 1,000 Units by mouth daily.   Yes [provider]  imipramine (TOFRANIL) 25 MG tablet Take 1 tablet (25 mg total) by mouth at bedtime. 09/16/19  Yes Burchette, Alinda Sierras, MD   magnesium 30 MG tablet Take 30 mg by mouth daily.   Yes [provider]  Multiple Vitamin (MULTIVITAMIN PO) Take 1 capsule by mouth daily.   Yes [provider]  omeprazole (PRILOSEC) 20 MG capsule Take 20 mg by mouth daily.   Yes [provider]  triamcinolone cream (KENALOG) 0.1 % Apply 1 application topically 2 (two) times daily. For 1-2 weeks. Patient taking differently: Apply 1 application topically 2 (two) times daily as needed (rash).  02/20/18  Yes Burchette, Alinda Sierras, MD  vitamin C (ASCORBIC ACID) 250 MG tablet Take 500 mg by mouth daily.   Yes [provider]  estradiol (ESTRACE) 1 MG tablet Take 1 tablet (1 mg total) by mouth daily. Patient not taking: Reported on 10/12/2018 07/16/18   Eulas Post, MD  meloxicam (MOBIC) 15 MG tablet Take 1 tablet (15 mg total) by mouth daily. Patient not taking: Reported on 10/12/2018 03/20/18   Garrel Ridgel, DPM    Allergies    Codeine sulfate and Sulfa antibiotics  Review of Systems   Review of Systems  Cardiovascular: Positive for chest pain.  All other systems reviewed and are negative.   Physical Exam Updated Vital Signs BP 135/88    Pulse 76    Temp 99 F (37.2 C) (Oral)    Resp 18    SpO2 99%   Physical Exam Vitals and nursing note reviewed.  Constitutional:      Appearance: She is well-developed.  HENT:     Head: Normocephalic and atraumatic.  Cardiovascular:     Rate and Rhythm: Normal rate and regular rhythm.     Heart sounds: No murmur.  Pulmonary:     Effort: Pulmonary effort is normal. No respiratory distress.     Breath sounds: Normal breath sounds.  Abdominal:     Palpations: Abdomen is soft.     Tenderness: There is no abdominal tenderness. There is no guarding or rebound.  Musculoskeletal:        General: No swelling or tenderness.     Comments: 2+ radial pulses bilaterally  Skin:    General: Skin is warm and dry.  Neurological:     Mental Status: She is alert and oriented  to person, place, and time.     Comments: No asymmetry of facial movements. Visual fields are grossly intact. Five out of five strength in all four extremities. There is altered sensation to light touch over the left second digit, otherwise sensation to light touch is intact.  Psychiatric:        Behavior: Behavior normal.     ED Results / Procedures / Treatments   Labs (all labs ordered are listed, but only abnormal results are displayed) Labs Reviewed  BASIC METABOLIC PANEL  CBC  I-STAT BETA HCG BLOOD, ED ([facility], [facility], [facility] ONLY)  TROPONIN I (HIGH SENSITIVITY)    EKG EKG Interpretation  Date/Time:  Tuesday September 24 2019 15:41:47 EST Ventricular Rate:  78 PR Interval:    QRS Duration: 103 QT Interval:  397 QTC Calculation: 453 R Axis:   159 Text Interpretation: Sinus or ectopic atrial rhythm Right axis deviation Abnormal T, consider ischemia, lateral leads Confirmed by Madalyn Rob [phone]) on 09/24/2019 4:11:24 PM   Radiology DG Chest 2 View  Result Date: 09/24/2019 CLINICAL DATA:  Chest pain EXAM: CHEST - 2 VIEW COMPARISON:  07/27/2018 FINDINGS: The heart size and mediastinal contours are within normal limits. Both lungs are clear. The visualized skeletal structures are unremarkable. IMPRESSION: No active cardiopulmonary disease. Electronically Signed   By: Franchot Gallo M.D.   On: 09/24/2019 16:11   CT Head Wo Contrast  Result Date: 09/24/2019 CLINICAL DATA:  Left hand numbness and tingling, imbalance EXAM: CT HEAD WITHOUT CONTRAST CT CERVICAL SPINE WITHOUT CONTRAST TECHNIQUE: Multidetector CT imaging of the head and cervical spine was performed following the standard protocol without intravenous contrast. Multiplanar CT image reconstructions of the cervical spine were also generated. COMPARISON:  None. FINDINGS: CT HEAD FINDINGS Brain: No evidence of acute infarction, hemorrhage, hydrocephalus, extra-axial collection or mass lesion/mass effect. Vascular: No hyperdense vessel  or unexpected calcification. Skull: Normal. Negative for fracture or focal lesion. Sinuses/Orbits: No acute finding. Other: None. CT CERVICAL SPINE FINDINGS Alignment: Normal. Skull base and vertebrae: No acute fracture. No primary bone lesion or focal pathologic process. Soft tissues and spinal canal: No prevertebral fluid or swelling. No visible canal hematoma. Disc levels: Mild multilevel disc space height loss and osteophytosis of the lower cervical spine. Upper chest: Negative. Other: None. IMPRESSION: 1. No acute intracranial pathology. 2. No fracture or static subluxation of the cervical spine. 3. Mild multilevel disc space height loss and osteophytosis of the lower cervical spine. Cervical disc and neural foraminal pathology may be further evaluated by MRI if indicated by localizing signs and symptoms. Electronically Signed   By: Eddie Candle M.D.   On: 09/24/2019 18:59   CT Cervical Spine Wo Contrast  Result Date: 09/24/2019 CLINICAL DATA:  Left hand numbness and tingling, imbalance EXAM: CT HEAD WITHOUT CONTRAST CT CERVICAL SPINE WITHOUT CONTRAST TECHNIQUE: Multidetector CT imaging of the head and cervical spine was performed following the standard protocol without intravenous contrast. Multiplanar CT image reconstructions of the cervical spine were also generated. COMPARISON:  None. FINDINGS: CT HEAD FINDINGS Brain: No evidence of acute infarction, hemorrhage, hydrocephalus, extra-axial collection or mass lesion/mass effect. Vascular: No hyperdense vessel or unexpected calcification. Skull: Normal. Negative for fracture or focal lesion. Sinuses/Orbits: No acute finding. Other: None. CT CERVICAL SPINE FINDINGS Alignment: Normal. Skull base and vertebrae: No acute fracture. No primary bone lesion or focal pathologic process. Soft tissues and spinal canal: No prevertebral fluid or swelling. No visible canal hematoma. Disc levels: Mild multilevel disc space height loss and osteophytosis of the lower  cervical spine. Upper chest: Negative. Other: None. IMPRESSION: 1. No acute intracranial pathology. 2. No fracture or static subluxation of the cervical spine. 3. Mild multilevel disc space height loss and osteophytosis of the lower cervical spine. Cervical disc and neural foraminal pathology may be further evaluated by MRI if indicated by localizing signs and symptoms. Electronically Signed   By: Eddie Candle M.D.   On: 09/24/2019 18:59    Procedures Procedures (including critical care time)  Medications  Ordered in ED Medications - No data to display  ED Course  I have reviewed the triage vital signs and the nursing notes.  Pertinent labs & imaging results that were available during my care of the patient were reviewed by me and considered in my medical decision making (see chart for details).    MDM Rules/Calculators/A&P                     patient here for evaluation of paresthesias to the left first and second digits as well as one year of chest discomfort. She is non-toxic appearing on evaluation. EKG is similar when compared to priors. Presentation is not consistent with ACS, PE, dissection, pneumonia. In terms of her paresthesias, CT head is negative for acute abnormality, presentation is not consistent with CVA. She does have some changes to her cervical spine, question radicular cause of her pain. She has no weakness on examination. Discussed with patient PCP follow-up, may need MRI of the spine to further evaluate her symptoms.  Final Clinical Impression(s) / ED Diagnoses Final diagnoses:  Paresthesia  Atypical chest pain    Rx / DC Orders ED Discharge Orders    None       Quintella Reichert, MD 09/24/19 1943

## 2019-10-15 ENCOUNTER — Other Ambulatory Visit: Payer: Self-pay

## 2019-10-15 ENCOUNTER — Ambulatory Visit (AMBULATORY_SURGERY_CENTER): Payer: Self-pay | Admitting: *Deleted

## 2019-10-15 VITALS — Temp 96.4°F | Ht 67.0 in | Wt 187.8 lb

## 2019-10-15 DIAGNOSIS — Z01818 Encounter for other preprocedural examination: Secondary | ICD-10-CM

## 2019-10-15 DIAGNOSIS — Z86010 Personal history of colonic polyps: Secondary | ICD-10-CM

## 2019-10-15 MED ORDER — SUPREP BOWEL PREP KIT 17.5-3.13-1.6 GM/177ML PO SOLN: ORAL | 0 refills | Status: DC

## 2019-10-15 NOTE — Progress Notes (Signed)
Pt has never been intubated- epidural during vaginal hysterectomy- no trouble moving neck  PONV, no hx of malignant hyperthermia  Pt is aware that care partner will wait in the car during procedure; if they feel like they will be too hot or cold to wait in the car; they may wait in the 4 th floor lobby. Patient is aware to bring only one care partner. We want them to wear a mask (we do not have any that we can provide them), practice social distancing, and we will check their temperatures when they get here.  I did remind the patient that their care partner needs to stay in the parking lot the entire time and have a cell phone available, we will call them when the pt is ready for discharge. Patient will wear mask into building.   No egg or soy allergy  No home oxygen use   No medications for weight loss taken  Suprep coupon given and code put into prescription

## 2019-10-23 ENCOUNTER — Ambulatory Visit (INDEPENDENT_AMBULATORY_CARE_PROVIDER_SITE_OTHER): Payer: 59 | Admitting: Family Medicine

## 2019-10-23 ENCOUNTER — Ambulatory Visit (INDEPENDENT_AMBULATORY_CARE_PROVIDER_SITE_OTHER): Payer: 59

## 2019-10-23 ENCOUNTER — Encounter: Payer: Self-pay | Admitting: Family Medicine

## 2019-10-23 ENCOUNTER — Other Ambulatory Visit: Payer: Self-pay

## 2019-10-23 VITALS — BP 104/62 | HR 84 | Ht 67.0 in | Wt 186.0 lb

## 2019-10-23 DIAGNOSIS — M7062 Trochanteric bursitis, left hip: Secondary | ICD-10-CM

## 2019-10-23 DIAGNOSIS — M545 Low back pain: Secondary | ICD-10-CM

## 2019-10-23 DIAGNOSIS — M7061 Trochanteric bursitis, right hip: Secondary | ICD-10-CM | POA: Diagnosis not present

## 2019-10-23 DIAGNOSIS — G8929 Other chronic pain: Secondary | ICD-10-CM

## 2019-10-23 IMAGING — DX DG LUMBAR SPINE 2-3V
3 series · 3 of 3 positions shown · non-contrast
Comparison: CT [DATE]

CLINICAL DATA: Low back pain

EXAM:
LUMBAR SPINE - 2-3 VIEW

[lumbar spine ap]
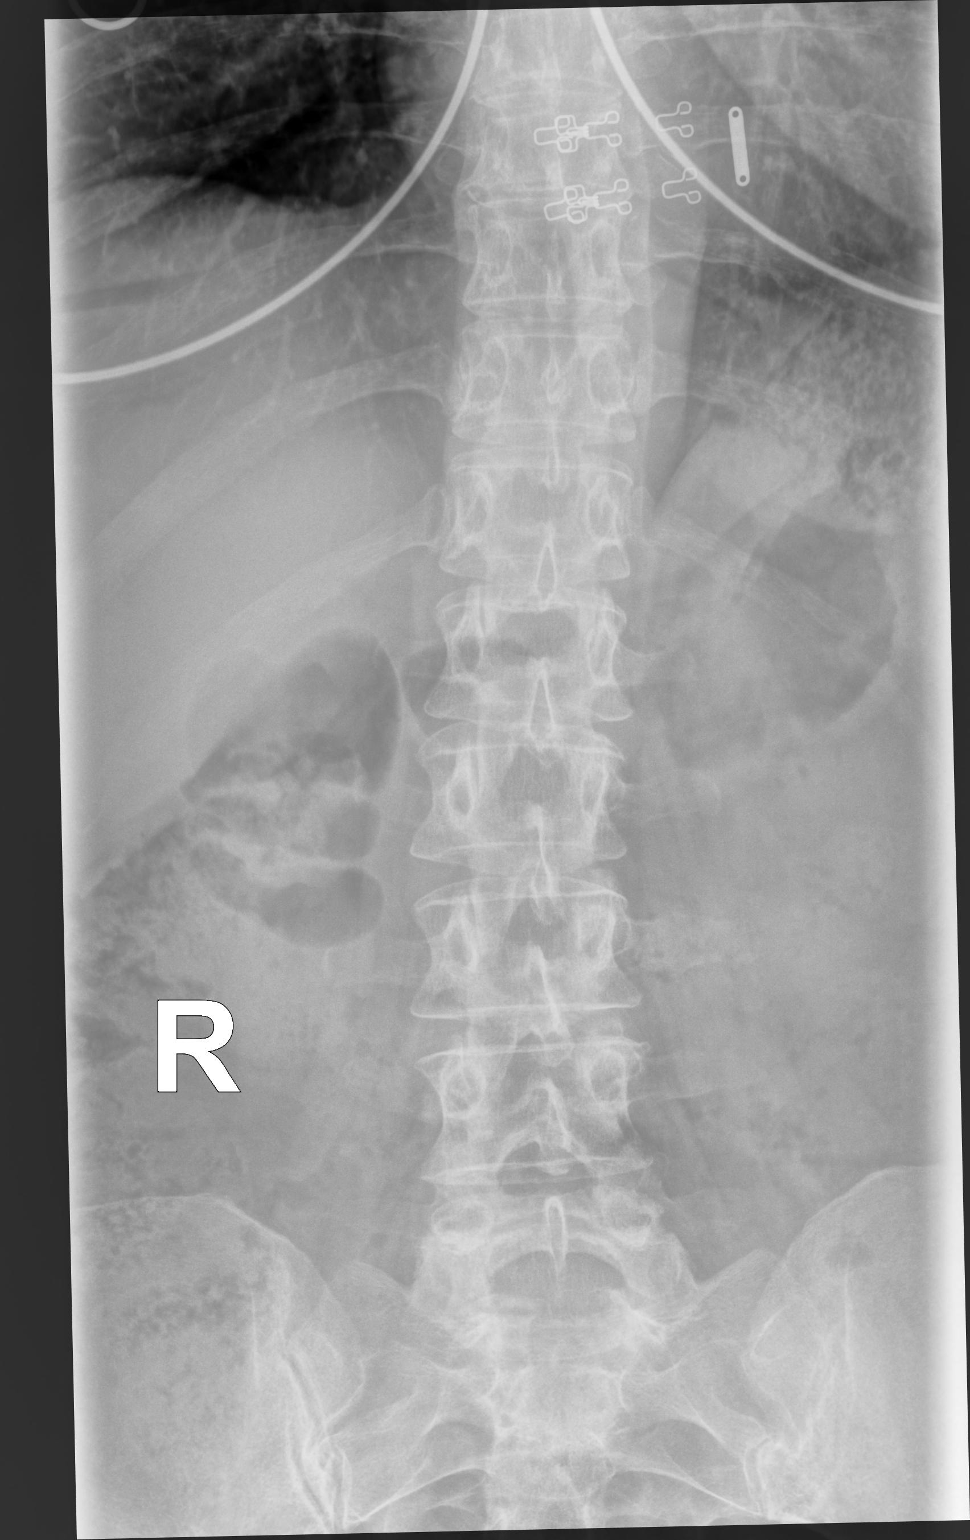

[lumbar spine lat (1 of 2)]
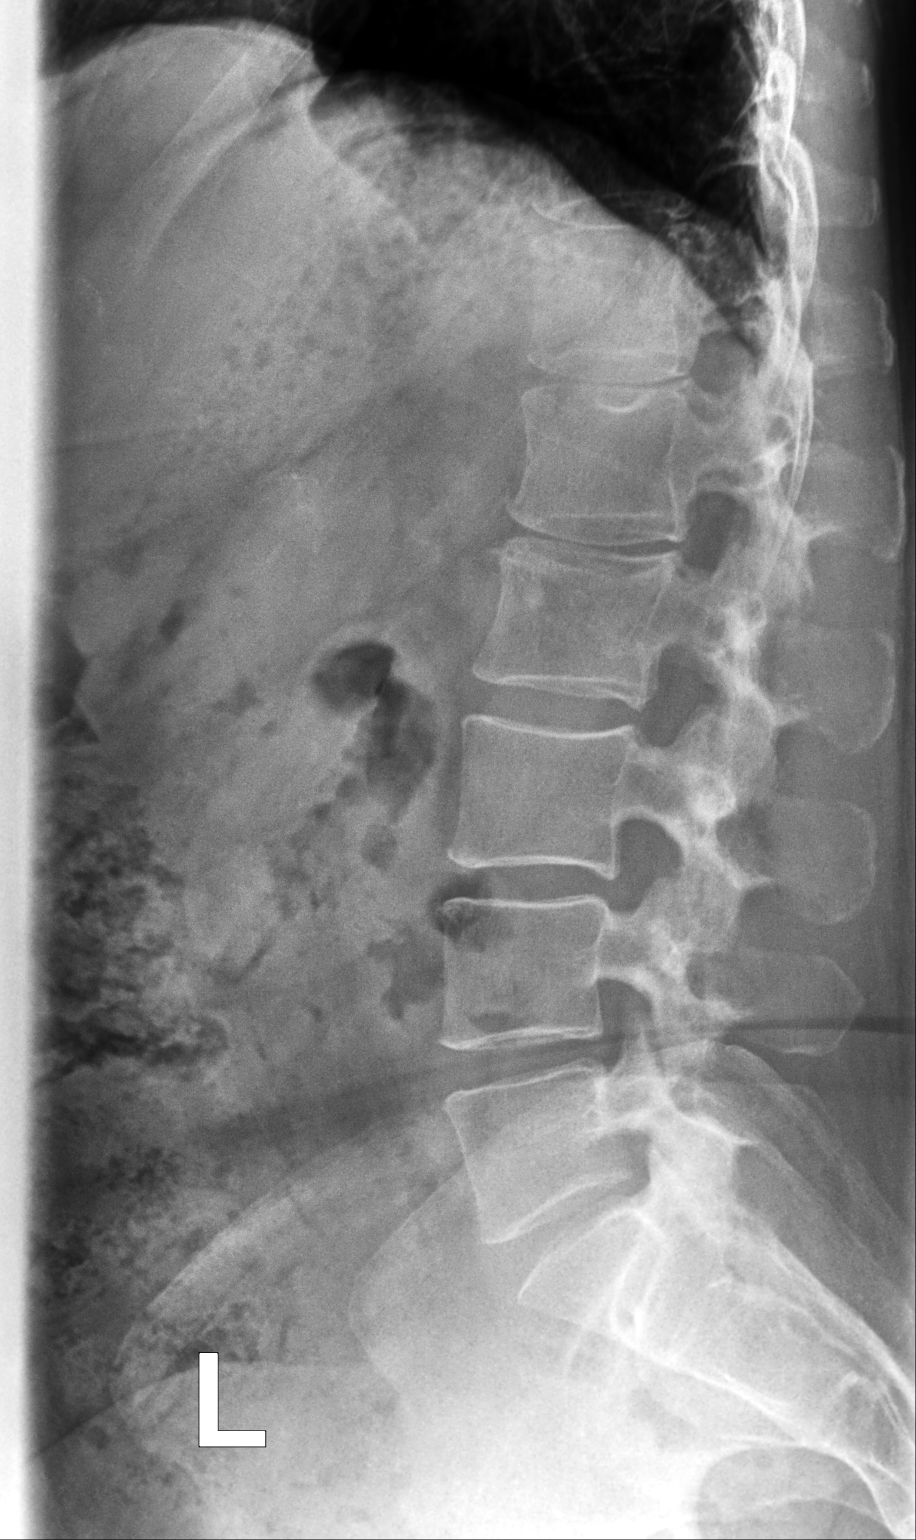

[lumbar spine lat (2 of 2)]
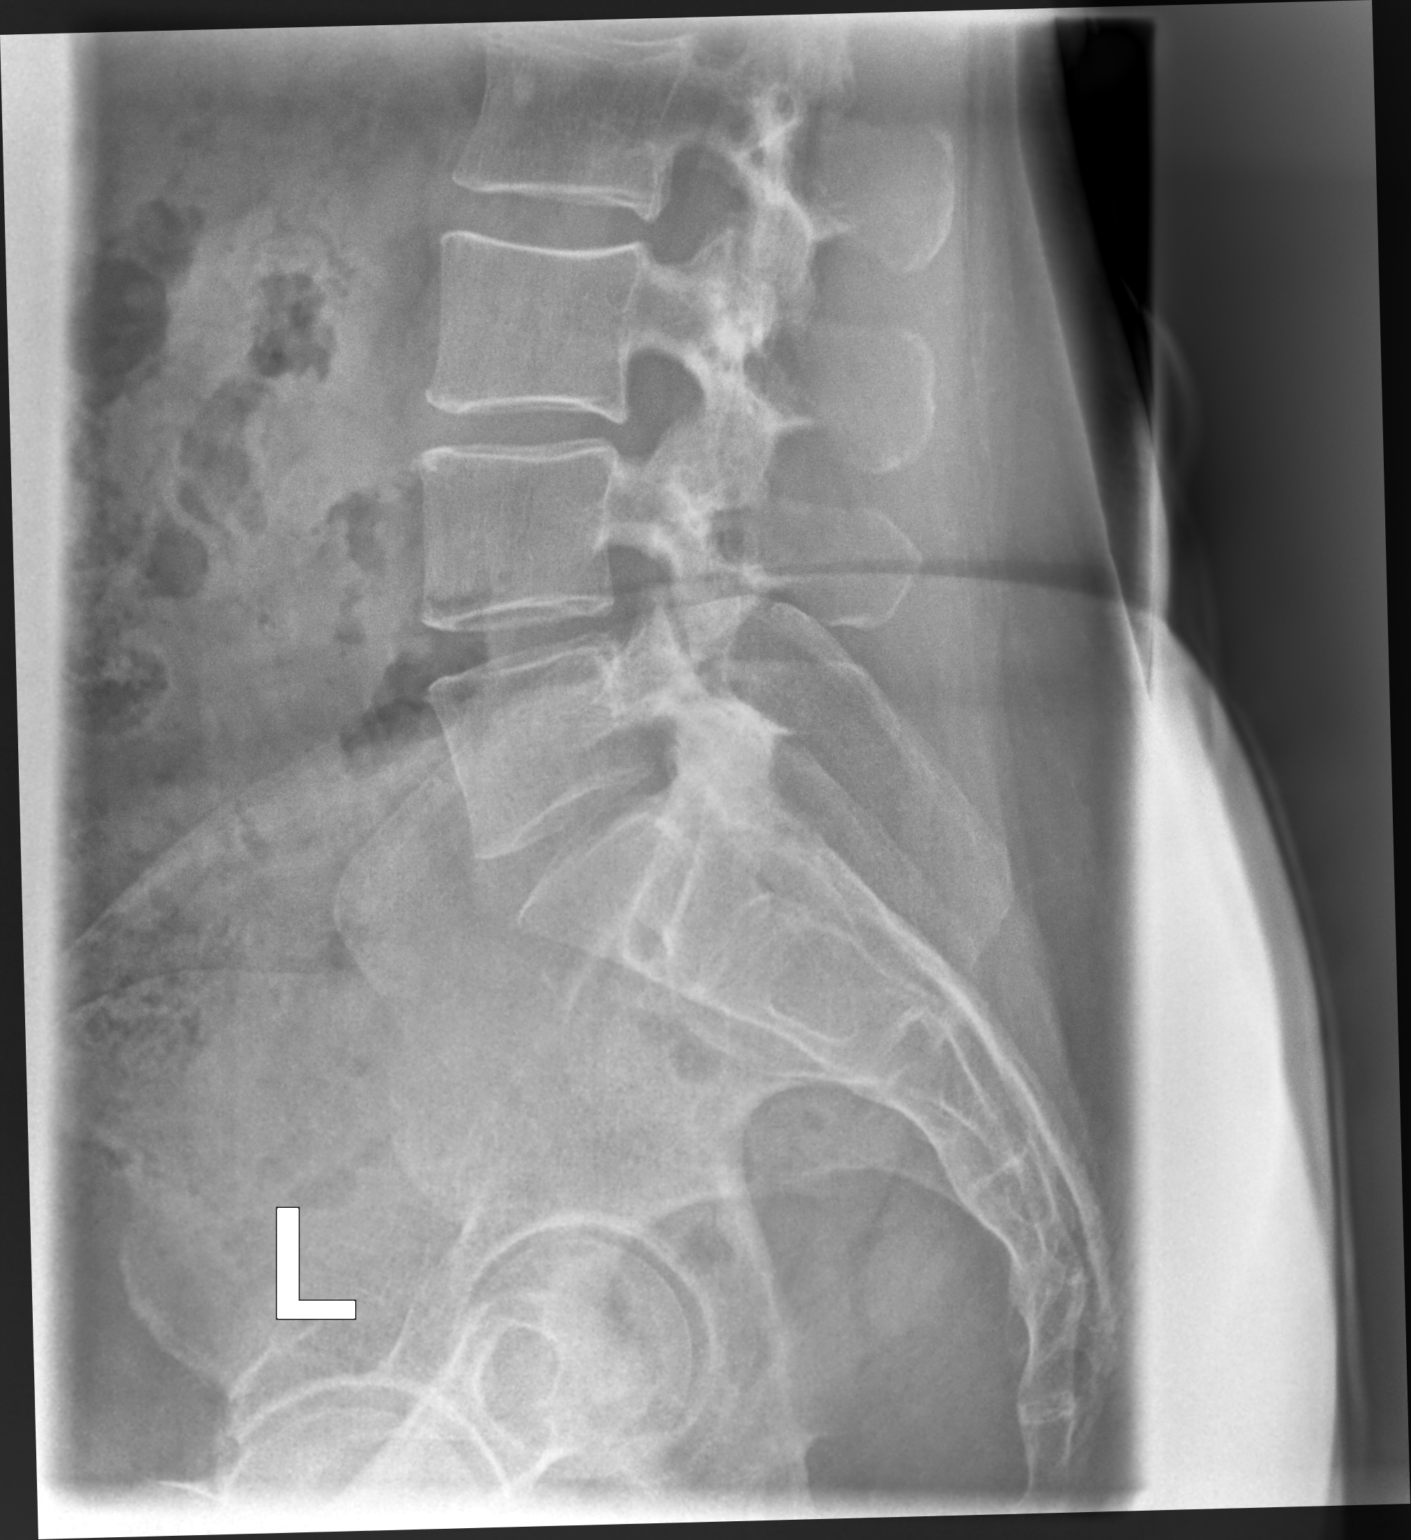

[3 of 3 positions shown; findings below may reference images not displayed]

FINDINGS: Sagittal alignment is within normal limits. Vertebral heights appear
grossly normal. Mild disc space narrowing and degenerative change at
L1-L2 and L5-S1.
IMPRESSION: 1. No acute osseous abnormality.
2. Mild degenerative changes most evident at L1-L2

## 2019-10-23 MED ORDER — GABAPENTIN 100 MG PO CAPS: 200.0000 mg | ORAL_CAPSULE | Freq: Every day | ORAL | 0 refills | Status: DC

## 2019-10-23 MED ORDER — DICLOFENAC SODIUM 2 % EX SOLN: 2.0000 g | Freq: Two times a day (BID) | CUTANEOUS | 3 refills | Status: DC

## 2019-10-23 NOTE — Assessment & Plan Note (Signed)
Bilateral injections given today.  Tolerated procedure well.  Differential includes potential lumbar radiculopathy and I would like x-rays to further evaluate.  Patient could be a candidate for also gabapentin.  This medication was prescribed today.  Home exercises given for more like core stability and back pain.  Patient will try other over-the-counter medications that I think will be beneficial.  Topical anti-inflammatories given.  Follow-up again in 4 to 8 weeks

## 2019-10-23 NOTE — Progress Notes (Signed)
[place] Phone: [phone] Subjective:   [name], am serving as a scribe for Dr. Hulan Saas. This visit occurred during the SARS-CoV-2 public health emergency.  Safety protocols were in place, including screening questions prior to the visit, additional usage of staff PPE, and extensive cleaning of exam room while observing appropriate contact time as indicated for disinfecting solutions.   I'm seeing this patient by the request  of:  Eulas Post, MD  CC: Hip and back pain  [id number]  Kelly Gilbert is a 59 y.o. female coming in with complaint of bilateral  Hip and lower back pain. Pain over iliac crest. Pain increases with weight bearing. Having a hard time exercising. Pain in lower back is right sided more than left. Pain radiates down anterior lateral thigh bilaterally. Uses Advil for pain.  Pain in hip and back became worse when patient sprained right ankle 2 weeks ago and then sprained her right groin trying to save herself from spraining her other ankle the next week.    Past Medical History:  Diagnosis Date  . Arthritis   . GERD (gastroesophageal reflux disease)   . Lesion of bladder   . Migraine headache   . PONV (postoperative nausea and vomiting)    Past Surgical History:  Procedure Laterality Date  . BENIGN LEFT BREASET BX  1992  . COLONOSCOPY    . CYSTOSCOPY WITH BIOPSY  01/16/2012   Procedure: CYSTOSCOPY WITH BIOPSY;  Surgeon: Molli Hazard, MD;  Location: [facility];  Service: Urology;  Laterality: N/A;  1 hour requested for this case  C-ARM CAMERA    . VAGINAL HYSTERECTOMY  1999   EPIDURAL ANES.   Social History   Socioeconomic History  . Marital status: Married    Spouse name: Not on file  . Number of children: 2  . Years of education: Not on file  . Highest education level: Not on file  Occupational History    Comment: Realtor    Tobacco Use  . Smoking status: Current Every Day Smoker    Packs/day: 0.25    Years: 35.00    Pack years: 8.75    Types: Cigarettes  . Smokeless tobacco: Never Used  . Tobacco comment: QUIT SMOKING 2008--  RECENTLY STARTED BACK 7-8 CIG. PER DAY  Substance and Sexual Activity  . Alcohol use: Yes    Alcohol/week: 2.0 standard drinks    Types: 2 Shots of liquor per week    Comment: Occasional  . Drug use: Never  . Sexual activity: Not on file  Other Topics Concern  . Not on file  Social History Narrative  . Not on file   Social Determinants of Health   Financial Resource Strain:   . Difficulty of Paying Living Expenses:   Food Insecurity:   . Worried About Charity fundraiser in the Last Year:   . Arboriculturist in the Last Year:   Transportation Needs:   . Film/video editor (Medical):   Marland Kitchen Lack of Transportation (Non-Medical):   Physical Activity:   . Days of Exercise per Week:   . Minutes of Exercise per Session:   Stress:   . Feeling of Stress :   Social Connections:   . Frequency of Communication with Friends and Family:   . Frequency of Social Gatherings with Friends and Family:   . Attends Religious Services:   . Active Member  of Clubs or Organizations:   . Attends Archivist Meetings:   Marland Kitchen Marital Status:    Allergies  Allergen Reactions  . Codeine Sulfate Nausea And Vomiting  . Sulfa Antibiotics Hives and Swelling   Family History  Problem Relation Age of Onset  . Hypertension Father   . Cancer Father        prostate  . Hypertension Maternal Grandmother   . Cancer Mother 55       renal cell cancer  . Colon cancer Neg Hx   . Pancreatic cancer Neg Hx   . Stomach cancer Neg Hx   . Esophageal cancer Neg Hx     Current Outpatient Medications (Endocrine & Metabolic):  .  estradiol (ESTRACE) 1 MG tablet, Take 1 tablet (1 mg total) by mouth daily.    Current Outpatient Medications (Analgesics):  Marland Kitchen  Aspirin-Acetaminophen-Caffeine  (EXCEDRIN PO), Take 1 tablet by mouth daily as needed (headache).  .  meloxicam (MOBIC) 15 MG tablet, Take 1 tablet (15 mg total) by mouth daily.   Current Outpatient Medications (Other):  .  amphetamine-dextroamphetamine (ADDERALL) 15 MG tablet, Take 1 tablet by mouth 2 (two) times daily. May refill in one month (Patient taking differently: Take 15 mg by mouth daily as needed (adhd). May refill in one month) .  Biotin 5000 MCG CAPS, Take 1 capsule by mouth every evening.  .  Calcium Carb-Cholecalciferol (CALCIUM 600 + D PO), Take by mouth daily. .  cholecalciferol (VITAMIN D3) 25 MCG (1000 UNIT) tablet, Take 1,000 Units by mouth daily. Marland Kitchen  imipramine (TOFRANIL) 25 MG tablet, Take 1 tablet (25 mg total) by mouth at bedtime. Marland Kitchen  L-LYSINE PO, Take 1,000 mg by mouth daily. .  magnesium 30 MG tablet, Take 30 mg by mouth daily. .  Multiple Vitamin (MULTIVITAMIN PO), Take 1 capsule by mouth daily. .  Na Sulfate-K Sulfate-Mg Sulf (SUPREP BOWEL PREP KIT) 17.5-3.13-1.6 GM/177ML SOLN, Suprep as directed, no substitutions .  NON FORMULARY, Doterra- alpha CRS and Cellular Vitality complex- 2 daily .  NON FORMULARY, Doterra Micro Plex VMz Food Nutrient complex- 2 daily .  NON FORMULARY, Doterra- EO Mega essential oil omega complex-  2 daily .  omeprazole (PRILOSEC) 20 MG capsule, Take 20 mg by mouth daily. Marland Kitchen  triamcinolone cream (KENALOG) 0.1 %, Apply 1 application topically 2 (two) times daily. For 1-2 weeks. (Patient taking differently: Apply 1 application topically 2 (two) times daily as needed (rash). ) .  vitamin C (ASCORBIC ACID) 250 MG tablet, Take 500 mg by mouth daily. .  Diclofenac Sodium 2 % SOLN, Place 2 g onto the skin 2 (two) times daily. Marland Kitchen  gabapentin (NEURONTIN) 100 MG capsule, Take 2 capsules (200 mg total) by mouth at bedtime.   Reviewed prior external information including notes and imaging from  primary care provider As well as notes that were available from care everywhere and other  healthcare systems.  Past medical history, social, surgical and family history all reviewed in electronic medical record.  No pertanent information unless stated regarding to the chief complaint.   Review of Systems:  No headache, visual changes, nausea, vomiting, diarrhea, constipation, dizziness, abdominal pain, skin rash, fevers, chills, night sweats, weight loss, swollen lymph nodes, body aches, joint swelling, chest pain, shortness of breath, mood changes. POSITIVE muscle aches  Objective  Blood pressure 104/62, pulse 84, height '5\' 7"'  (1.702 m), weight 186 lb (84.4 kg), SpO2 99 %.   General: No apparent distress alert and oriented x3  mood and affect normal, dressed appropriately.  HEENT: Pupils equal, extraocular movements intact  Respiratory: Patient's speak in full sentences and does not appear short of breath  Cardiovascular: No lower extremity edema, non tender, no erythema  Skin: Warm dry intact with no signs of infection or rash on extremities or on axial skeleton.  Abdomen: Soft nontender  Neuro: Cranial nerves II through XII are intact, neurovascularly intact in all extremities with 2+ DTRs and 2+ pulses.  Lymph: No lymphadenopathy of posterior or anterior cervical chain or axillae bilaterally.  Gait normal with good balance and coordination.  MSK:  tender with full range of motion and good stability and symmetric strength and tone of shoulders, elbows, wrist, knee and ankles bilaterally.  Low back exam shows the patient does have some mild tenderness over the sacroiliac joint right greater than left.  Positive Faber bilaterally.  Severe tenderness over the greater trochanteric area bilaterally.  Negative straight leg test noted today.  Patient does have mild weakness to hip flexor secondary to groin injury on the right side.  After verbal consent patient was prepped with alcohol swabs and with a 21-gauge 2 inch needle injected into the right greater trochanteric area with a total  of 1 cc of 0.5% Marcaine and 1 cc of Kenalog 40 mg/mL.  No blood loss.  Band-Aid placed.  Postinjection instructions given.  Verbal consent patient was prepped with alcohol swab and with a 21-gauge 2 inch needle was injected into the left greater trochanteric area with a total of 1 cc of 0.5% Marcaine and 1 cc of Kenalog 40 mg/mL.  No blood loss.  Band-Aid placed.  Postinjection instructions given.   Impression and Recommendations:     This case required medical decision making of moderate complexity. The above documentation has been reviewed and is accurate and complete Lyndal Pulley, DO       Note: This dictation was prepared with Dragon dictation along with smaller phrase technology. Any transcriptional errors that result from this process are unintentional.

## 2019-10-23 NOTE — Patient Instructions (Signed)
Xray Good to see you.  Ice 20 minutes 2 times daily. Usually after activity and before bed. Exercises 3 times a week.  pennsaid pinkie amount topically 2 times daily as needed.  Gabapentin 200 mg at night  Turmeric 500mg  daily  Tart cherry extract 1200mg  at night Vitamin D 2000 IU daily  GT injections bilaterally See me again in 5-6 weeks

## 2019-10-24 ENCOUNTER — Encounter: Payer: Self-pay | Admitting: Internal Medicine

## 2019-10-25 ENCOUNTER — Other Ambulatory Visit: Payer: Self-pay | Admitting: Internal Medicine

## 2019-10-26 LAB — SARS CORONAVIRUS 2 (TAT 6-24 HRS): SARS Coronavirus 2: NEGATIVE

## 2019-10-29 ENCOUNTER — Ambulatory Visit (AMBULATORY_SURGERY_CENTER): Payer: 59 | Admitting: Internal Medicine

## 2019-10-29 ENCOUNTER — Other Ambulatory Visit: Payer: Self-pay

## 2019-10-29 ENCOUNTER — Encounter: Payer: Self-pay | Admitting: Internal Medicine

## 2019-10-29 VITALS — BP 130/84 | HR 67 | Temp 97.1°F | Resp 13 | Ht 67.0 in | Wt 187.0 lb

## 2019-10-29 DIAGNOSIS — Z86010 Personal history of colonic polyps: Secondary | ICD-10-CM

## 2019-10-29 DIAGNOSIS — K635 Polyp of colon: Secondary | ICD-10-CM

## 2019-10-29 DIAGNOSIS — D124 Benign neoplasm of descending colon: Secondary | ICD-10-CM

## 2019-10-29 MED ORDER — SODIUM CHLORIDE 0.9 % IV SOLN: 500.0000 mL | Freq: Once | INTRAVENOUS | Status: DC

## 2019-10-29 NOTE — Op Note (Signed)
[facility] Patient Name: Kelly Gilbert Procedure Date: 10/29/2019 10:27 AM MRN: [id number] Endoscopist: Jerene Bears , MD Age: 59 Referring MD:  Date of Birth: [birth year] Gender: Female Account #: [id number] Procedure:                Colonoscopy Indications:              High risk colon cancer surveillance: Personal                            history of non-advanced adenoma, Last colonoscopy:                            November 2014 Medicines:                Monitored Anesthesia Care Procedure:                Pre-Anesthesia Assessment:                           - Prior to the procedure, a History and Physical                            was performed, and patient medications and                            allergies were reviewed. The patient's tolerance of                            previous anesthesia was also reviewed. The risks                            and benefits of the procedure and the sedation                            options and risks were discussed with the patient.                            All questions were answered, and informed consent                            was obtained. Prior Anticoagulants: The patient has                            taken no previous anticoagulant or antiplatelet                            agents. ASA Grade Assessment: II - A patient with                            mild systemic disease. After reviewing the risks                            and benefits, the patient was deemed in  satisfactory condition to undergo the procedure.                           After obtaining informed consent, the colonoscope                            was passed under direct vision. Throughout the                            procedure, the patient's blood pressure, pulse, and                            oxygen saturations were monitored continuously. The                            Colonoscope was introduced through the anus and                             advanced to the cecum, identified by appendiceal                            orifice and ileocecal valve. The colonoscopy was                            performed without difficulty. The patient tolerated                            the procedure well. The quality of the bowel                            preparation was good. The ileocecal valve,                            appendiceal orifice, and rectum were photographed. Scope In: 10:37:22 AM Scope Out: 10:53:07 AM Scope Withdrawal Time: 0 hours 12 minutes 31 seconds  Total Procedure Duration: 0 hours 15 minutes 45 seconds  Findings:                 The digital rectal exam was normal.                           A 4 mm polyp was found in the descending colon. The                            polyp was sessile. The polyp was removed with a                            cold snare. Resection and retrieval were complete.                           A few small-mouthed diverticula were found in the                            sigmoid colon.  Internal hemorrhoids were found during                            retroflexion. The hemorrhoids were small. Complications:            No immediate complications. Estimated Blood Loss:     Estimated blood loss: none. Impression:               - One 4 mm polyp in the descending colon, removed                            with a cold snare. Resected and retrieved.                           - Diverticulosis in the sigmoid colon.                           - Small internal hemorrhoids. Recommendation:           - Patient has a contact number available for                            emergencies. The signs and symptoms of potential                            delayed complications were discussed with the                            patient. Return to normal activities tomorrow.                            Written discharge instructions were provided to the                             patient.                           - Resume previous diet.                           - Continue present medications.                           - Await pathology results.                           - Repeat colonoscopy is recommended for                            surveillance. The colonoscopy date will be                            determined after pathology results from today's                            exam become available for review. Jerene Bears, MD 10/29/2019 10:55:05 AM This report has been signed electronically.

## 2019-10-29 NOTE — Progress Notes (Signed)
Report given to PACU, vss 

## 2019-10-29 NOTE — Progress Notes (Signed)
Called to room to assist during endoscopic procedure.  Patient ID and intended procedure confirmed with present staff. Received instructions for my participation in the procedure from the performing physician.  

## 2019-10-29 NOTE — Patient Instructions (Signed)
Diverticulosis and hemorrhoids.  1 polyp removed and sent to pathology.  Await pathology for final recommendations.    YOU HAD AN ENDOSCOPIC PROCEDURE TODAY AT [facility]:   Refer to the procedure report that was given to you for any specific questions about what was found during the examination.  If the procedure report does not answer your questions, please call your gastroenterologist to clarify.  If you requested that your care partner not be given the details of your procedure findings, then the procedure report has been included in a sealed envelope for you to review at your convenience later.  YOU SHOULD EXPECT: Some feelings of bloating in the abdomen. Passage of more gas than usual.  Walking can help get rid of the air that was put into your GI tract during the procedure and reduce the bloating. If you had a lower endoscopy (such as a colonoscopy or flexible sigmoidoscopy) you may notice spotting of blood in your stool or on the toilet paper. If you underwent a bowel prep for your procedure, you may not have a normal bowel movement for a few days.  Please Note:  You might notice some irritation and congestion in your nose or some drainage.  This is from the oxygen used during your procedure.  There is no need for concern and it should clear up in a day or so.  SYMPTOMS TO REPORT IMMEDIATELY:   Following lower endoscopy (colonoscopy or flexible sigmoidoscopy):  Excessive amounts of blood in the stool  Significant tenderness or worsening of abdominal pains  Swelling of the abdomen that is new, acute  Fever of 100F or higher    For urgent or emergent issues, a gastroenterologist can be reached at any hour by calling [phone]. Do not use MyChart messaging for urgent concerns.    DIET:  We do recommend a small meal at first, but then you may proceed to your regular diet.  Drink plenty of fluids but you should avoid alcoholic beverages for 24 hours.  ACTIVITY:   You should plan to take it easy for the rest of today and you should NOT DRIVE or use heavy machinery until tomorrow (because of the sedation medicines used during the test).    FOLLOW UP: Our staff will call the number listed on your records 48-72 hours following your procedure to check on you and address any questions or concerns that you may have regarding the information given to you following your procedure. If we do not reach you, we will leave a message.  We will attempt to reach you two times.  During this call, we will ask if you have developed any symptoms of COVID 19. If you develop any symptoms (ie: fever, flu-like symptoms, shortness of breath, cough etc.) before then, please call [phone].  If you test positive for Covid 19 in the 2 weeks post procedure, please call and report this information to [place].    If any biopsies were taken you will be contacted by phone or by letter within the next 1-3 weeks.  Please call us at [phone] if you have not heard about the biopsies in 3 weeks.    SIGNATURES/CONFIDENTIALITY: You and/or your care partner have signed paperwork which will be entered into your electronic medical record.  These signatures attest to the fact that that the information above on your After Visit Summary has been reviewed and is understood.  Full responsibility of the confidentiality of this discharge information lies with you  and/or your care-partner. 

## 2019-10-29 NOTE — Progress Notes (Signed)
Pt's states no medical or surgical changes since previsit or office visit.  Temp- June Vitals- Donna 

## 2019-10-31 ENCOUNTER — Telehealth: Payer: Self-pay | Admitting: *Deleted

## 2019-10-31 ENCOUNTER — Ambulatory Visit: Payer: 59 | Admitting: Family Medicine

## 2019-10-31 ENCOUNTER — Encounter: Payer: Self-pay | Admitting: Internal Medicine

## 2019-10-31 NOTE — Telephone Encounter (Signed)
  Follow up Call-  Call back number 10/29/2019  Post procedure Call Back phone  # [phone]  Permission to leave phone message Yes  Some recent data might be hidden     Patient questions:  Do you have a fever, pain , or abdominal swelling? No. Pain Score  0 *  Have you tolerated food without any problems? Yes.    Have you been able to return to your normal activities? Yes.    Do you have any questions about your discharge instructions: Diet   No. Medications  No. Follow up visit  No.  Do you have questions or concerns about your Care? No.  Actions: * If pain score is 4 or above: No action needed, pain <4  1. Have you developed a fever since your procedure? NO  2.   Have you had an respiratory symptoms (SOB or cough) since your procedure? NO  3.   Have you tested positive for COVID 19 since your procedure NO  4.   Have you had any family members/close contacts diagnosed with the COVID 19 since your procedure?  NO   If yes to any of these questions please route to Joylene John, RN and Erenest Rasher, RN

## 2019-10-31 NOTE — Telephone Encounter (Signed)
First attempt, left VM.  

## 2019-11-27 ENCOUNTER — Ambulatory Visit (INDEPENDENT_AMBULATORY_CARE_PROVIDER_SITE_OTHER): Payer: 59 | Admitting: Family Medicine

## 2019-11-27 ENCOUNTER — Other Ambulatory Visit: Payer: Self-pay

## 2019-11-27 ENCOUNTER — Encounter: Payer: Self-pay | Admitting: Family Medicine

## 2019-11-27 DIAGNOSIS — M7061 Trochanteric bursitis, right hip: Secondary | ICD-10-CM

## 2019-11-27 DIAGNOSIS — M7062 Trochanteric bursitis, left hip: Secondary | ICD-10-CM | POA: Diagnosis not present

## 2019-11-27 NOTE — Assessment & Plan Note (Signed)
Significant improvement and patient states 99% better.  Does have some potential signs of some intermittent radicular symptoms we will need to monitor and encouraged her to continue the gabapentin.  As long as patient does well follow-up as needed

## 2019-11-27 NOTE — Progress Notes (Signed)
[place] Phone: [phone] Subjective:   Kelly Gilbert, am serving as a scribe for Dr. Hulan Saas. This visit occurred during the SARS-CoV-2 public health emergency.  Safety protocols were in place, including screening questions prior to the visit, additional usage of staff PPE, and extensive cleaning of exam room while observing appropriate contact time as indicated for disinfecting solutions.   I'm seeing this patient by the request  of:  Eulas Post, MD  CC: Bilateral hip pain follow-up  [id number]   10/23/2019 Bilateral injections given today.  Tolerated procedure well.  Differential includes potential lumbar radiculopathy and I would like x-rays to further evaluate.  Patient could be a candidate for also gabapentin.  This medication was prescribed today.  Home exercises given for more like core stability and back pain.  Patient will try other over-the-counter medications that I think will be beneficial.  Topical anti-inflammatories given.  Follow-up again in 4 to 8 weeks  Update 11/27/2019 Kelly Gilbert is a 59 y.o. female coming in with complaint of low back pain and hip pain. Patient states that she had relief from injections. Dull pain started to come back this week but is over happy with progress.  Patient states that the foot pain is significantly improved as well.  Feels like the gabapentin is helping her with sleep.  Overall feels like she has made significant progress.     Past Medical History:  Diagnosis Date  . Arthritis   . GERD (gastroesophageal reflux disease)   . Lesion of bladder   . Migraine headache   . PONV (postoperative nausea and vomiting)    Past Surgical History:  Procedure Laterality Date  . BENIGN LEFT BREASET BX  1992  . COLONOSCOPY    . CYSTOSCOPY WITH BIOPSY  01/16/2012   Procedure: CYSTOSCOPY WITH BIOPSY;  Surgeon: Molli Hazard, MD;  Location: [facility];  Service: Urology;  Laterality: N/A;  1 hour requested for this case  C-ARM CAMERA    . VAGINAL HYSTERECTOMY  1999   EPIDURAL ANES.   Social History   Socioeconomic History  . Marital status: Married    Spouse name: Not on file  . Number of children: 2  . Years of education: Not on file  . Highest education level: Not on file  Occupational History    Comment: Realtor  Tobacco Use  . Smoking status: Current Every Day Smoker    Packs/day: 0.25    Years: 35.00    Pack years: 8.75    Types: Cigarettes  . Smokeless tobacco: Never Used  . Tobacco comment: QUIT SMOKING 2008--  RECENTLY STARTED BACK 7-8 CIG. PER DAY  Substance and Sexual Activity  . Alcohol use: Yes    Alcohol/week: 2.0 standard drinks    Types: 2 Shots of liquor per week    Comment: Occasional  . Drug use: Never  . Sexual activity: Not on file  Other Topics Concern  . Not on file  Social History Narrative  . Not on file   Social Determinants of Health   Financial Resource Strain:   . Difficulty of Paying Living Expenses:   Food Insecurity:   . Worried About Charity fundraiser in the Last Year:   . Arboriculturist in the Last Year:   Transportation Needs:   . Film/video editor (Medical):   Marland Kitchen Lack of Transportation (Non-Medical):   Physical Activity:   .  Days of Exercise per Week:   . Minutes of Exercise per Session:   Stress:   . Feeling of Stress :   Social Connections:   . Frequency of Communication with Friends and Family:   . Frequency of Social Gatherings with Friends and Family:   . Attends Religious Services:   . Active Member of Clubs or Organizations:   . Attends Archivist Meetings:   Marland Kitchen Marital Status:    Allergies  Allergen Reactions  . Codeine Sulfate Nausea And Vomiting  . Sulfa Antibiotics Hives and Swelling   Family History  Problem Relation Age of Onset  . Hypertension Father   . Cancer Father        prostate  . Hypertension Maternal  Grandmother   . Cancer Mother 38       renal cell cancer  . Colon cancer Neg Hx   . Pancreatic cancer Neg Hx   . Stomach cancer Neg Hx   . Esophageal cancer Neg Hx     Current Outpatient Medications (Endocrine & Metabolic):  .  estradiol (ESTRACE) 1 MG tablet, Take 1 tablet (1 mg total) by mouth daily.       Current Outpatient Medications (Analgesics):  Marland Kitchen  Aspirin-Acetaminophen-Caffeine (EXCEDRIN PO), Take 1 tablet by mouth daily as needed (headache).      Current Outpatient Medications (Other):  .  amphetamine-dextroamphetamine (ADDERALL) 15 MG tablet, Take 1 tablet by mouth 2 (two) times daily. May refill in one month (Patient taking differently: Take 15 mg by mouth daily as needed (adhd). May refill in one month) .  Biotin 5000 MCG CAPS, Take 1 capsule by mouth every evening.  .  Calcium Carb-Cholecalciferol (CALCIUM 600 + D PO), Take by mouth daily. .  cholecalciferol (VITAMIN D3) 25 MCG (1000 UNIT) tablet, Take 1,000 Units by mouth daily. .  Diclofenac Sodium 2 % SOLN, Place 2 g onto the skin 2 (two) times daily. Marland Kitchen  gabapentin (NEURONTIN) 100 MG capsule, Take 2 capsules (200 mg total) by mouth at bedtime. Marland Kitchen  imipramine (TOFRANIL) 25 MG tablet, Take 1 tablet (25 mg total) by mouth at bedtime. Marland Kitchen  L-LYSINE PO, Take 1,000 mg by mouth daily. .  magnesium 30 MG tablet, Take 30 mg by mouth daily. .  Multiple Vitamin (MULTIVITAMIN PO), Take 1 capsule by mouth daily. .  NON FORMULARY, Doterra- alpha CRS and Cellular Vitality complex- 2 daily .  NON FORMULARY, Doterra Micro Plex VMz Food Nutrient complex- 2 daily .  NON FORMULARY, Doterra- EO Mega essential oil omega complex-  2 daily .  omeprazole (PRILOSEC) 20 MG capsule, Take 20 mg by mouth daily. Marland Kitchen  triamcinolone cream (KENALOG) 0.1 %, Apply 1 application topically 2 (two) times daily. For 1-2 weeks. (Patient taking differently: Apply 1 application topically 2 (two) times daily as needed (rash). ) .  TURMERIC PO, Take 2 capsules  by mouth. .  vitamin C (ASCORBIC ACID) 250 MG tablet, Take 500 mg by mouth daily.  Current Facility-Administered Medications (Other):  .  0.9 %  sodium chloride infusion   Reviewed prior external information including notes and imaging from  primary care provider As well as notes that were available from care everywhere and other healthcare systems.  Past medical history, social, surgical and family history all reviewed in electronic medical record.  Gilbert pertanent information unless stated regarding to the chief complaint.   Review of Systems:  Gilbert headache, visual changes, nausea, vomiting, diarrhea, constipation, dizziness, abdominal pain, skin rash, fevers,  chills, night sweats, weight loss, swollen lymph nodes, body aches, joint swelling, chest pain, shortness of breath, mood changes. POSITIVE muscle aches  Objective  Blood pressure 124/84, pulse 77, height 5\' 7"  (1.702 m), weight 186 lb (84.4 kg), SpO2 99 %.   General: Gilbert apparent distress alert and oriented x3 mood and affect normal, dressed appropriately.  HEENT: Pupils equal, extraocular movements intact  Respiratory: Patient's speak in full sentences and does not appear short of breath  Cardiovascular: Gilbert lower extremity edema, non tender, Gilbert erythema  Neuro: Cranial nerves II through XII are intact, neurovascularly intact in all extremities with 2+ DTRs and 2+ pulses.  Gait normal with good balance and coordination.  MSK:  Non tender with full range of motion and good stability and symmetric strength and tone of shoulders, elbows, wrist,  knee and ankles bilaterally.  Hip: Bilateral ROM IR: 25 Deg, ER: 45 Deg, Flexion: 120 Deg, Extension: 100 Deg, Abduction: 45 Deg, Adduction: 45 Deg Strength IR: 5/5, ER: 5/5, Flexion: 5/5, Extension: 5/5, Abduction: 5/5, Adduction: 5/5 Pelvic alignment unremarkable to inspection and palpation. Standing hip rotation and gait without trendelenburg sign / unsteadiness. Greater trochanter  without tenderness to palpation. Gilbert tenderness over piriformis and greater trochanter. Gilbert pain with FABER or FADIR. Gilbert SI joint tenderness and normal minimal SI movement.   Impression and Recommendations:      The above documentation has been reviewed and is accurate and complete Lyndal Pulley, DO       Note: This dictation was prepared with Dragon dictation along with smaller phrase technology. Any transcriptional errors that result from this process are unintentional.

## 2019-12-02 ENCOUNTER — Telehealth: Payer: Self-pay | Admitting: Family Medicine

## 2019-12-02 NOTE — Telephone Encounter (Signed)
The patient had a telehealth appointment today and wants [facility] to put in orders for a UA but I don't see the telehealth visit.   Please advise

## 2019-12-02 NOTE — Telephone Encounter (Signed)
Pt had telehealth with insurance offered pt a visit with [place] to be able to do urine sample pt declined

## 2020-03-06 ENCOUNTER — Other Ambulatory Visit: Payer: Self-pay | Admitting: Family Medicine

## 2020-03-17 ENCOUNTER — Encounter: Payer: Self-pay | Admitting: Family Medicine

## 2020-03-17 ENCOUNTER — Other Ambulatory Visit: Payer: Self-pay

## 2020-03-17 ENCOUNTER — Ambulatory Visit (INDEPENDENT_AMBULATORY_CARE_PROVIDER_SITE_OTHER): Payer: 59 | Admitting: Family Medicine

## 2020-03-17 DIAGNOSIS — M5010 Cervical disc disorder with radiculopathy, unspecified cervical region: Secondary | ICD-10-CM | POA: Diagnosis not present

## 2020-03-17 MED ORDER — KETOROLAC TROMETHAMINE 60 MG/2ML IM SOLN
30.0000 mg | Freq: Once | INTRAMUSCULAR | Status: AC
  Administered 2020-03-17: 30 mg via INTRAMUSCULAR

## 2020-03-17 MED ORDER — METHYLPREDNISOLONE ACETATE 80 MG/ML IJ SUSP
40.0000 mg | Freq: Once | INTRAMUSCULAR | Status: AC
  Administered 2020-03-17: 40 mg via INTRAMUSCULAR

## 2020-03-17 MED ORDER — GABAPENTIN 100 MG PO CAPS: 200.0000 mg | ORAL_CAPSULE | Freq: Every day | ORAL | 0 refills | Status: DC

## 2020-03-17 NOTE — Assessment & Plan Note (Signed)
CT scan previously did show some very mild degenerative disc disease.  Patient does have unfortunately radicular symptoms in the C6-C7 distribution some mild condition.  Patient is already on prednisone.  Toradol and Depo-Medrol given today, warned of potential side effects.  I do believe with the weakness we do need an MRI and patient could be a candidate for possible epidurals.  Depending on these findings treatment options after the imaging.  Patient is in agreement with the plan with worsening pain patient knows to seek medical attention immediately

## 2020-03-17 NOTE — Progress Notes (Signed)
[place] Phone: [phone] Subjective:   Kelly Gilbert, am serving as a scribe for Dr. Hulan Saas. This visit occurred during the SARS-CoV-2 public health emergency.  Safety protocols were in place, including screening questions prior to the visit, additional usage of staff PPE, and extensive cleaning of exam room while observing appropriate contact time as indicated for disinfecting solutions.   I'm seeing this patient by the request  of:  Eulas Post, MD  CC: Low back and bilateral hip pain follow-up  [id number]   11/27/2019 Significant improvement and patient states 99% better.  Does have some potential signs of some intermittent radicular symptoms we will need to monitor and encouraged her to continue the gabapentin.  As long as patient does well follow-up as needed  Update 03/17/2020 Kelly Gilbert is a 59 y.o. female coming in with complaint of left sided neck pain since Monday. Pain is radiating down her arm. Is using Flexeril and Prenisone after going to UC.     CT 09/24/2019 IMPRESSION: 1. Gilbert acute intracranial pathology. 2. Gilbert fracture or static subluxation of the cervical spine. 3. Mild multilevel disc space height loss and osteophytosis of the lower cervical spine. Cervical disc and neural foraminal pathology may be further evaluated by MRI if indicated by localizing signs and symptoms.   xrays lumbar were independently visualized by me.  Images were taken in March 2021 showing mild degenerative changes mostly at L1-L2 and L5-S1 Patient had bilateral GT injections in October 23, 2019 was 99% better and follow-up in April  Past Medical History:  Diagnosis Date  . Arthritis   . GERD (gastroesophageal reflux disease)   . Lesion of bladder   . Migraine headache   . PONV (postoperative nausea and vomiting)    Past Surgical History:  Procedure Laterality Date  . BENIGN LEFT BREASET  BX  1992  . COLONOSCOPY    . CYSTOSCOPY WITH BIOPSY  01/16/2012   Procedure: CYSTOSCOPY WITH BIOPSY;  Surgeon: Molli Hazard, MD;  Location[facility];  Service: Urology;  Laterality: N/A;  1 hour requested for this case  C-ARM CAMERA    . VAGINAL HYSTERECTOMY  1999   EPIDURAL ANES.   Social History   Socioeconomic History  . Marital status: Married    Spouse name: Not on file  . Number of children: 2  . Years of education: Not on file  . Highest education level: Not on file  Occupational History    Comment: Realtor  Tobacco Use  . Smoking status: Current Every Day Smoker    Packs/day: 0.25    Years: 35.00    Pack years: 8.75    Types: Cigarettes  . Smokeless tobacco: Never Used  . Tobacco comment: QUIT SMOKING 2008--  RECENTLY STARTED BACK 7-8 CIG. PER DAY  Vaping Use  . Vaping Use: Never used  Substance and Sexual Activity  . Alcohol use: Yes    Alcohol/week: 2.0 standard drinks    Types: 2 Shots of liquor per week    Comment: Occasional  . Drug use: Never  . Sexual activity: Not on file  Other Topics Concern  . Not on file  Social History Narrative  . Not on file   Social Determinants of Health   Financial Resource Strain:   . Difficulty of Paying Living Expenses:   Food Insecurity:   . Worried About Charity fundraiser in the Last Year:   .  Ran Out of Food in the Last Year:   Transportation Needs:   . Film/video editor (Medical):   Marland Kitchen Lack of Transportation (Non-Medical):   Physical Activity:   . Days of Exercise per Week:   . Minutes of Exercise per Session:   Stress:   . Feeling of Stress :   Social Connections:   . Frequency of Communication with Friends and Family:   . Frequency of Social Gatherings with Friends and Family:   . Attends Religious Services:   . Active Member of Clubs or Organizations:   . Attends Archivist Meetings:   Marland Kitchen Marital Status:    Allergies  Allergen Reactions  . Codeine Sulfate  Nausea And Vomiting  . Sulfa Antibiotics Hives and Swelling   Family History  Problem Relation Age of Onset  . Hypertension Father   . Cancer Father        prostate  . Hypertension Maternal Grandmother   . Cancer Mother 36       renal cell cancer  . Colon cancer Neg Hx   . Pancreatic cancer Neg Hx   . Stomach cancer Neg Hx   . Esophageal cancer Neg Hx     Current Outpatient Medications (Endocrine & Metabolic):  .  estradiol (ESTRACE) 1 MG tablet, Take 1 tablet (1 mg total) by mouth daily.       Current Outpatient Medications (Analgesics):  Marland Kitchen  Aspirin-Acetaminophen-Caffeine (EXCEDRIN PO), Take 1 tablet by mouth daily as needed (headache).      Current Outpatient Medications (Other):  .  amphetamine-dextroamphetamine (ADDERALL) 15 MG tablet, Take 1 tablet by mouth 2 (two) times daily. May refill in one month (Patient taking differently: Take 15 mg by mouth daily as needed (adhd). May refill in one month) .  Biotin 5000 MCG CAPS, Take 1 capsule by mouth every evening.  .  Calcium Carb-Cholecalciferol (CALCIUM 600 + D PO), Take by mouth daily. .  cholecalciferol (VITAMIN D3) 25 MCG (1000 UNIT) tablet, Take 1,000 Units by mouth daily. Marland Kitchen  gabapentin (NEURONTIN) 100 MG capsule, Take 2 capsules (200 mg total) by mouth at bedtime. Marland Kitchen  imipramine (TOFRANIL) 25 MG tablet, Take 1 tablet (25 mg total) by mouth at bedtime. Marland Kitchen  L-LYSINE PO, Take 1,000 mg by mouth daily. .  magnesium 30 MG tablet, Take 30 mg by mouth daily. .  Multiple Vitamin (MULTIVITAMIN PO), Take 1 capsule by mouth daily. .  NON FORMULARY, Doterra- alpha CRS and Cellular Vitality complex- 2 daily .  NON FORMULARY, Doterra Micro Plex VMz Food Nutrient complex- 2 daily .  NON FORMULARY, Doterra- EO Mega essential oil omega complex-  2 daily .  omeprazole (PRILOSEC) 20 MG capsule, Take 20 mg by mouth daily. Marland Kitchen  PENNSAID 2 % SOLN, APPLY TWO PUMPS (40MG ) TOPICALLY TO AFFECTED AREA(S) TWICE A DAY .  triamcinolone cream  (KENALOG) 0.1 %, Apply 1 application topically 2 (two) times daily. For 1-2 weeks. (Patient taking differently: Apply 1 application topically 2 (two) times daily as needed (rash). ) .  TURMERIC PO, Take 2 capsules by mouth. .  vitamin C (ASCORBIC ACID) 250 MG tablet, Take 500 mg by mouth daily. Marland Kitchen  gabapentin (NEURONTIN) 100 MG capsule, Take 2 capsules (200 mg total) by mouth at bedtime.  Current Facility-Administered Medications (Other):  .  0.9 %  sodium chloride infusion   Reviewed prior external information including notes and imaging from  primary care provider As well as notes that were available from care everywhere  and other healthcare systems.  Past medical history, social, surgical and family history all reviewed in electronic medical record.  Gilbert pertanent information unless stated regarding to the chief complaint.   Review of Systems:  Gilbert headache, visual changes, nausea, vomiting, diarrhea, constipation, dizziness, abdominal pain, skin rash, fevers, chills, night sweats, weight loss, swollen lymph nodes, body aches, joint swelling, chest pain, shortness of breath, mood changes. POSITIVE muscle aches  Objective  Blood pressure 132/88, pulse (!) 58, height 5\' 7"  (1.702 m), weight 187 lb (84.8 kg), SpO2 99 %.   General: Gilbert apparent distress alert and oriented x3 mood and affect normal, dressed appropriately.  HEENT: Pupils equal, extraocular movements intact  Respiratory: Patient's speak in full sentences and does not appear short of breath  Gait normal with good balance and coordination.  MSK:  Non tender with full range of motion and good stability and symmetric strength and tone of shoulders, elbows, wrist, hip, knee and ankles bilaterally.  Neck exam shows the patient does have loss of lordosis.  Patient is some positive Spurling's that is severe with 5 degrees of extension on the left side with radicular symptoms in the C6 distribution.  Does have some weakness in the C6  distribution.  Deep tendon reflexes though are intact at the moment.  Patient seems to feel more comfortable with her left arm over her head.   Impression and Recommendations:     The above documentation has been reviewed and is accurate and complete Kelly Pulley, DO       Note: This dictation was prepared with Dragon dictation along with smaller phrase technology. Any transcriptional errors that result from this process are unintentional.

## 2020-03-17 NOTE — Patient Instructions (Signed)
Gabapentin 200mg   MRI [facility] [phone] We will contact you once we have the results of MRI

## 2020-03-18 ENCOUNTER — Telehealth: Payer: Self-pay | Admitting: Family Medicine

## 2020-03-18 NOTE — Telephone Encounter (Signed)
Pt has MRI scheduled at [facility] for 8/29, this is their first available and does not work for pt. The epidural has not helped and she cannot wait this long.  Pt is willing to go to [facility] or wherever she can have the MRI done sooner. Can we assist her with this?

## 2020-03-18 NOTE — Telephone Encounter (Signed)
Sent patient MyChart message.

## 2020-03-20 ENCOUNTER — Telehealth: Payer: Self-pay

## 2020-03-20 MED ORDER — TIZANIDINE HCL 4 MG PO TABS: 4.0000 mg | ORAL_TABLET | Freq: Every evening | ORAL | 2 refills | Status: AC

## 2020-03-20 MED ORDER — MELOXICAM 15 MG PO TABS: 15.0000 mg | ORAL_TABLET | Freq: Every day | ORAL | 0 refills | Status: DC

## 2020-03-20 NOTE — Telephone Encounter (Signed)
Patient informed of information below. Patient stated understanding.

## 2020-03-20 NOTE — Telephone Encounter (Signed)
Patient called and is worried about her pain over the weekend because she has taken last dose of Prednisone has only a couple of pills of the muscle relaxer left. would like a call back to discuss what she can take or do over the weekend she is afraid of having the pain be really bad with no way to get relief.

## 2020-03-20 NOTE — Telephone Encounter (Signed)
Sent in Zanaflex which is a muscle relaxer she can take with the gabapentin at night to see if this will help, send in meloxicam as well she can take daily as long as it does not hurt her stomach.  Hopefully this will help through the weekend

## 2020-03-22 ENCOUNTER — Other Ambulatory Visit: Payer: Self-pay

## 2020-03-22 ENCOUNTER — Ambulatory Visit (INDEPENDENT_AMBULATORY_CARE_PROVIDER_SITE_OTHER): Payer: 59

## 2020-03-22 DIAGNOSIS — M5010 Cervical disc disorder with radiculopathy, unspecified cervical region: Secondary | ICD-10-CM

## 2020-03-22 IMAGING — MR MR CERVICAL SPINE W/O CM
5 series · 41 of 48 positions shown · non-contrast
Comparison: None.

CLINICAL DATA: Left shoulder and neck pain

EXAM:
MRI CERVICAL SPINE WITHOUT CONTRAST
TECHNIQUE: Multiplanar, multisequence MR imaging of the cervical spine was
performed. No intravenous contrast was administered.

[Series 4: T2 · sagittal · 3.0mm · 0.69mm/px · 6 of 13 slices shown (1 of 2)]
[im 1/13]
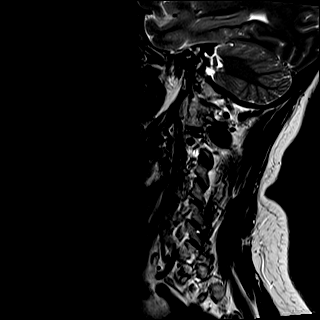
[im 3/13]
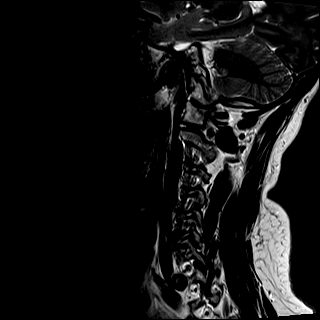
[im 5/13]
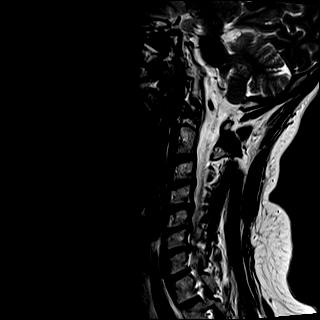
[im 8/13]
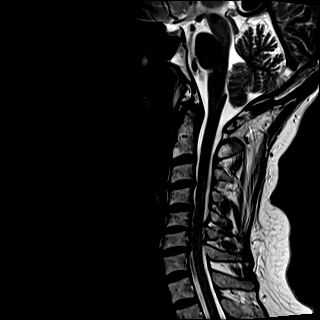
[im 10/13]
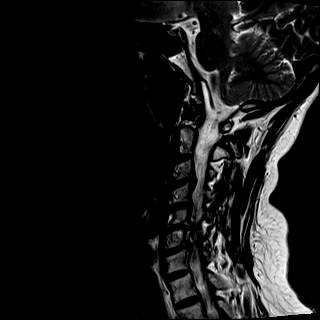
[im 13/13]
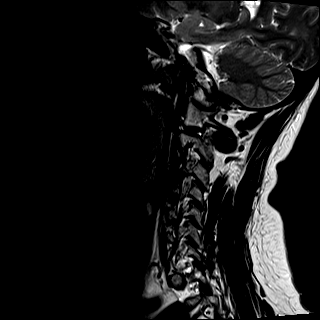

[Series 5: T1 · sagittal · 3.0mm · 0.86mm/px · 7 of 13 slices shown]
[im 1/13]
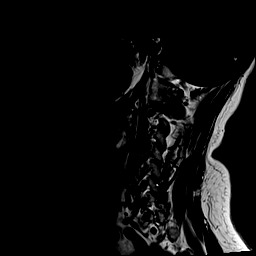
[im 3/13]
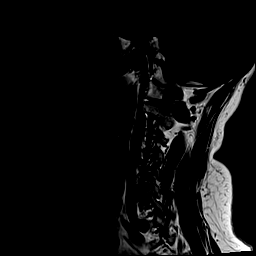
[im 5/13]
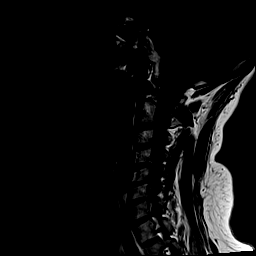
[im 7/13]
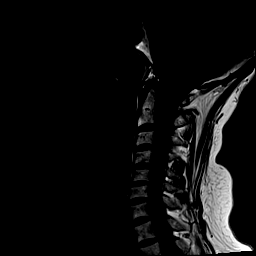
[im 9/13]
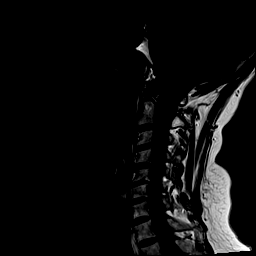
[im 11/13]
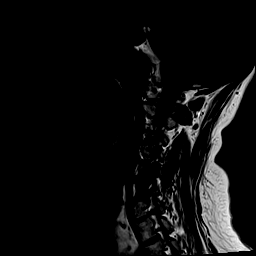
[im 13/13]
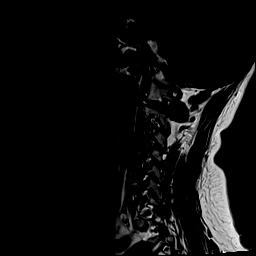

[Series 6: STIR · sagittal · 3.0mm · 0.69mm/px · 7 of 13 slices shown]
[im 1/13]
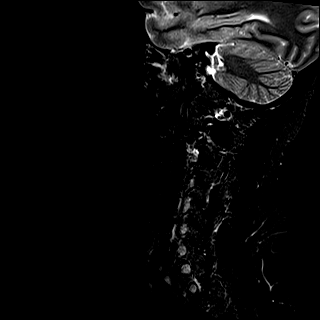
[im 3/13]
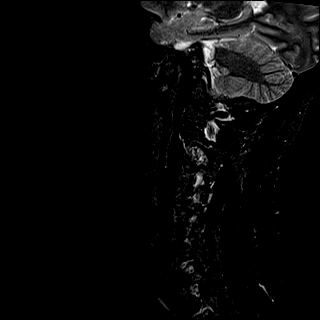
[im 5/13]
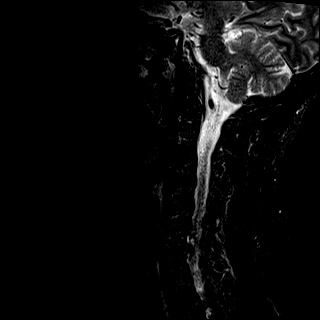
[im 7/13]
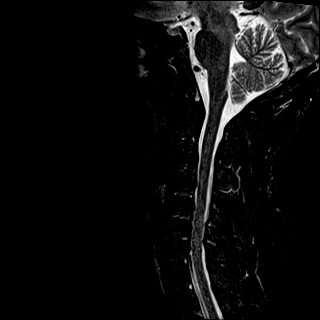
[im 9/13]
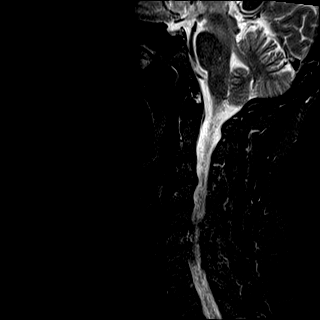
[im 11/13]
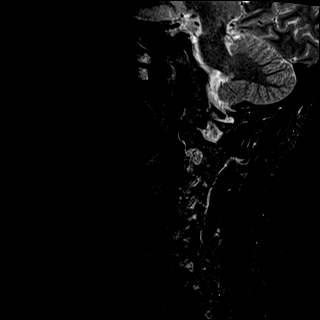
[im 13/13]
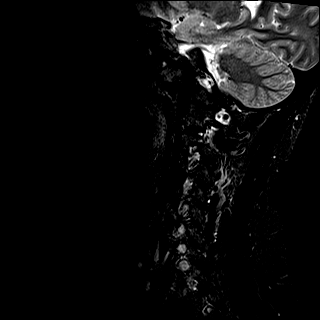

[Series 7: T2 · axial · 3.0mm · 0.62mm/px · z∈[-52,+44]mm · 13 of 27 slices shown (2 of 2)]
[im 1/27]
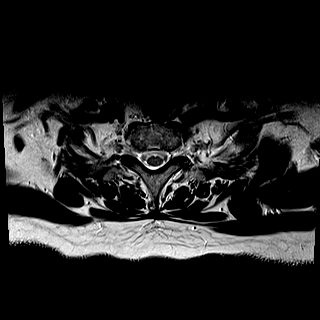
[im 3/27]
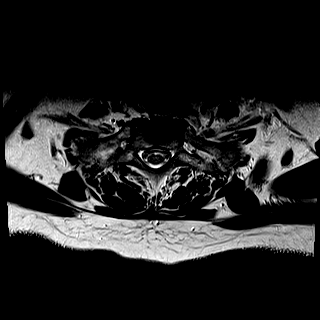
[im 5/27]
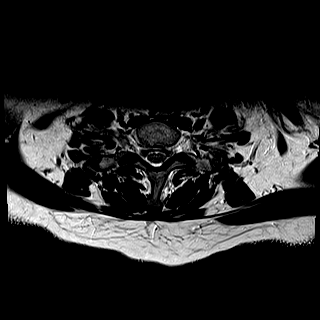
[im 7/27]
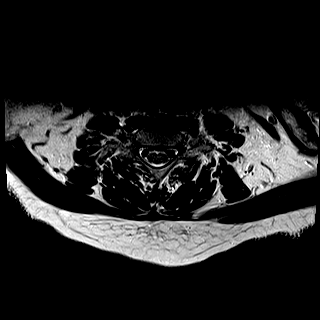
[im 9/27]
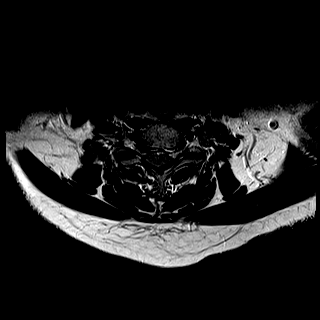
[im 11/27]
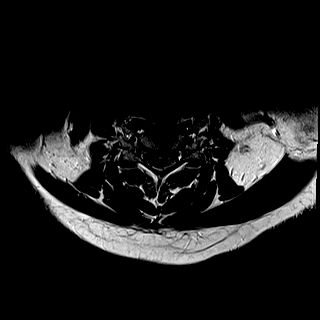
[im 13/27]
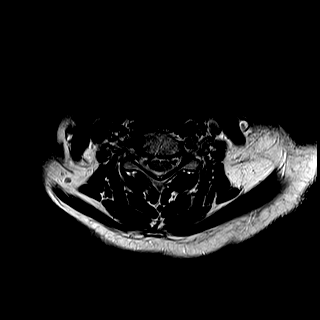
[im 15/27]
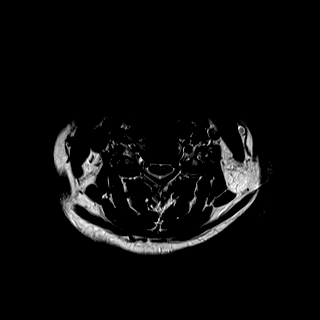
[im 17/27]
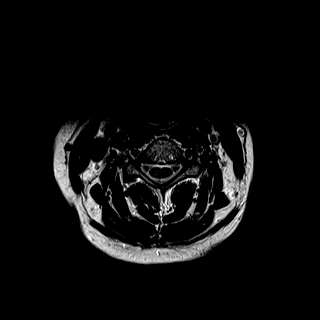
[im 19/27]
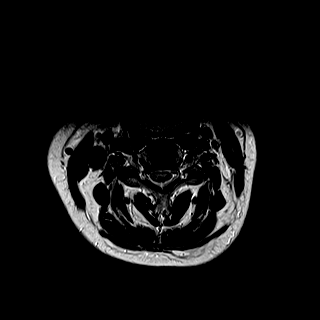
[im 21/27]
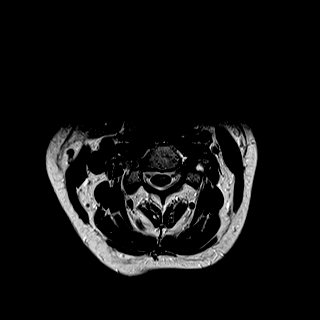
[im 23/27]
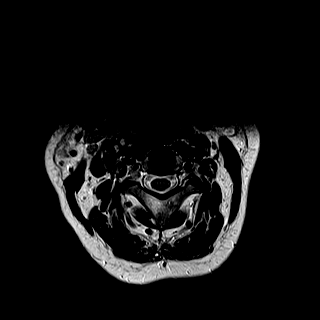
[im 27/27]
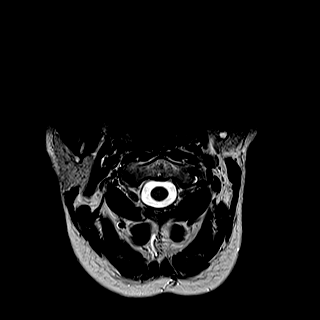

[Series 8: mpgr ax · axial · 3.0mm · 0.35mm/px · z∈[-43,+53]mm · 8 of 27 slices shown]
[im 1/27]
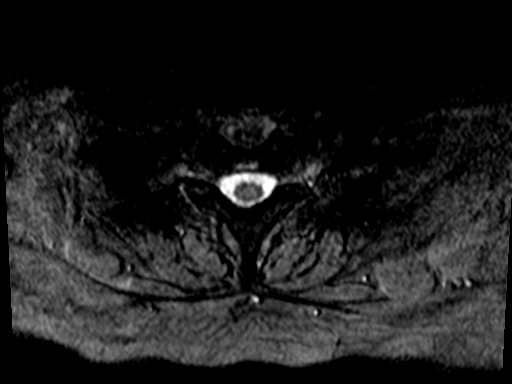
[im 5/27]
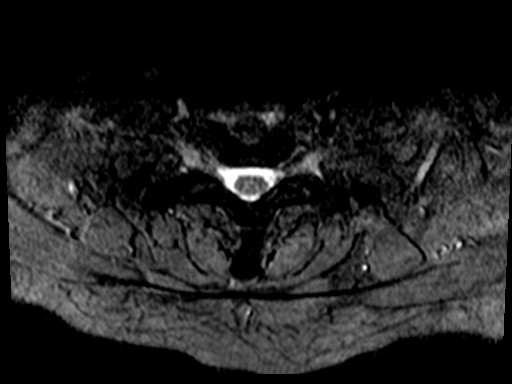
[im 9/27]
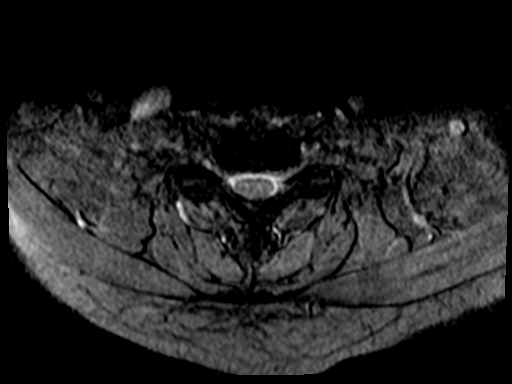
[im 13/27]
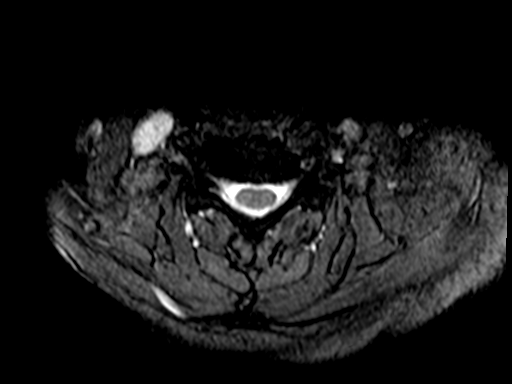
[im 15/27]
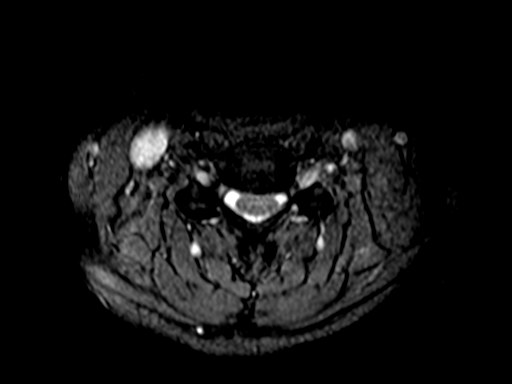
[im 19/27]
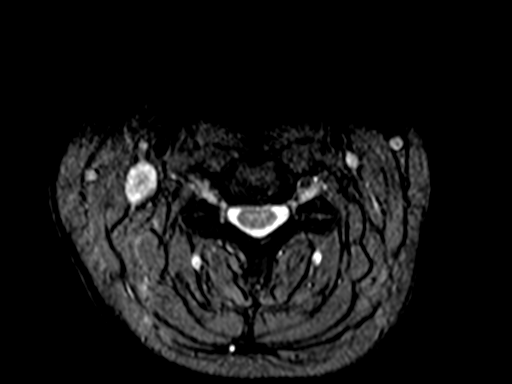
[im 23/27]
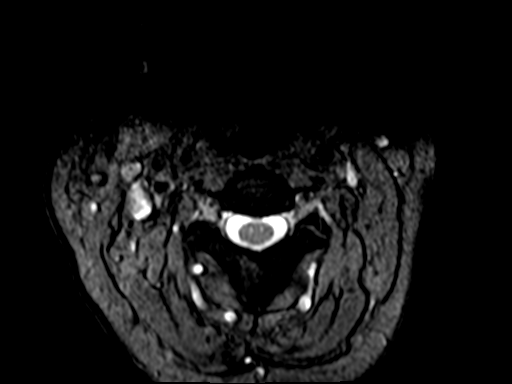
[im 27/27]
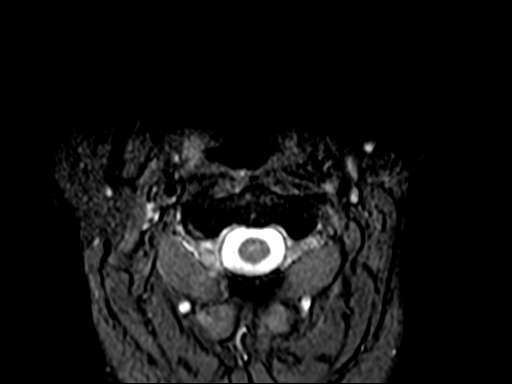

[41 of 48 positions shown; findings below may reference images not displayed]

FINDINGS: Alignment: Physiologic

Vertebrae: The vertebral body heights are well maintained. No
fracture, marrow edema,or pathologic marrow infiltration.

Cord: Normal signal and morphology.

Posterior Fossa, vertebral arteries, paraspinal tissues:

The visualized portion of the posterior fossa is unremarkable.
Normal flow voids seen within the vertebral arteries. The paraspinal
soft tissues are unremarkable.

Disc levels:

C1-C2: Atlanto-axial junction is normal, without canal narrowing

C2-C3: No significant spinal canal or neural foraminal narrowing

C3-C4: There is minimal disc osteophyte complex and uncovertebral
osteophytes which causes moderate right and mild left neural
foraminal narrowing.

C4-C5: There is a disc osteophyte complex and uncovertebral
osteophytes which causes moderate left and mild right neural
foraminal narrowing.

C5-C6: Disc osteophyte complex and uncovertebral osteophytes causes
severe bilateral neural foraminal narrowing and mild central canal
stenosis.

C6-C7: Disc osteophyte complex and uncovertebral osteophytes causes
severe bilateral neural foraminal narrowing and mild central canal
stenosis.

C7-T1: No significant spinal canal or neural foraminal narrowing
IMPRESSION: Straightening of the normal cervical lordosis.

Cervical spine spondylosis most notable from C5 through C7 with
severe bilateral neural foraminal narrowing and mild central canal
stenosis.

## 2020-03-23 ENCOUNTER — Encounter: Payer: Self-pay | Admitting: Family Medicine

## 2020-03-23 ENCOUNTER — Other Ambulatory Visit: Payer: 59

## 2020-03-24 ENCOUNTER — Other Ambulatory Visit: Payer: Self-pay

## 2020-03-24 DIAGNOSIS — M5010 Cervical disc disorder with radiculopathy, unspecified cervical region: Secondary | ICD-10-CM

## 2020-03-25 NOTE — Telephone Encounter (Signed)
Spoke with patient regarding where process is at with insurance. Patient voices understanding that epidural may have to be rescheduled. Patient states that she may need pain medication if it does have to be rescheduled. We will see if we receive insurance approval this afternoon or not.

## 2020-03-26 ENCOUNTER — Ambulatory Visit
Admission: RE | Admit: 2020-03-26 | Discharge: 2020-03-26 | Disposition: A | Discharge status: Home / Self Care | Payer: Self-pay | Source: Ambulatory Visit | Attending: Family Medicine | Admitting: Family Medicine

## 2020-03-26 ENCOUNTER — Other Ambulatory Visit: Payer: Self-pay

## 2020-03-26 DIAGNOSIS — M5010 Cervical disc disorder with radiculopathy, unspecified cervical region: Secondary | ICD-10-CM

## 2020-03-26 IMAGING — XA DG INJECT/[PERSON_NAME] INC NEEDLE/CATH/PLC EPI/CERV/THOR W/IMG
2 series · 2 of 2 positions shown · non-contrast
Comparison: none

CLINICAL DATA: Cervical spondylosis without myelopathy. Left upper
extremity pain and numbness.

[Series 1: ortho standard · 1 of 1 slices shown (1 of 2)]
[im 1/1]
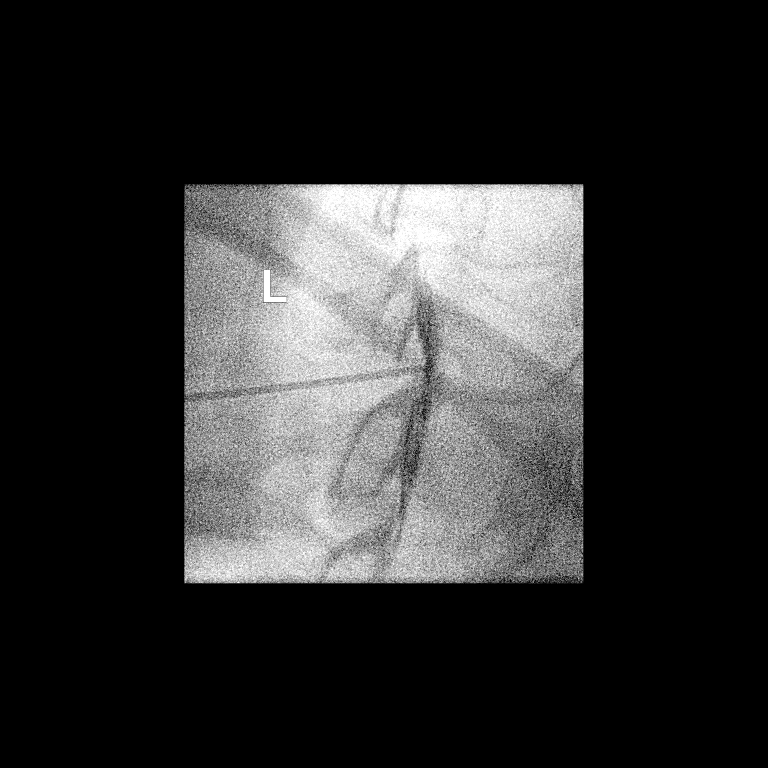

[Series 2: ortho standard · 1 of 1 slices shown (2 of 2)]
[im 1/1]
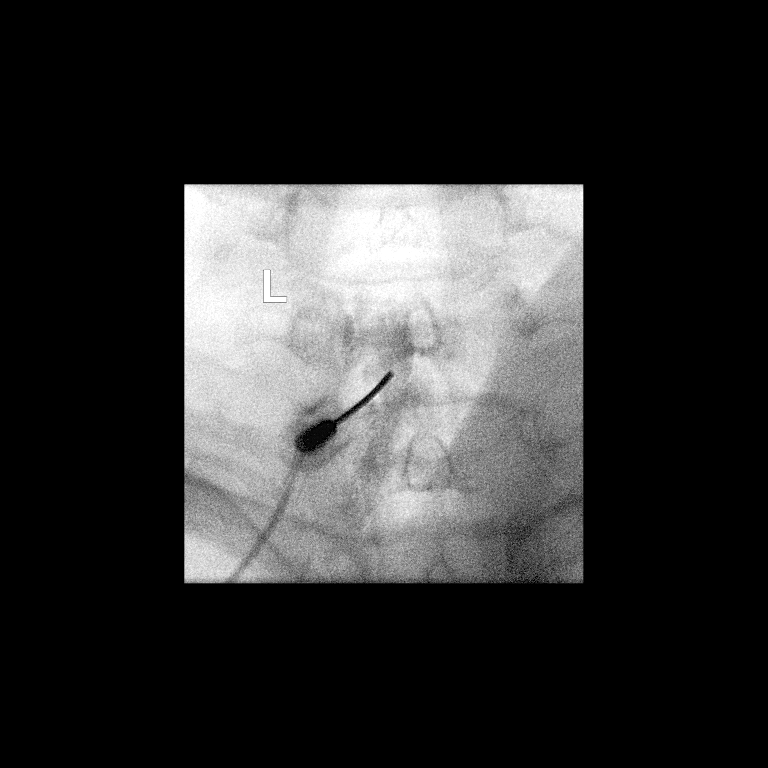

[2 of 2 positions shown; findings below may reference images not displayed]

FLUOROSCOPY TIME:  Fluoroscopy Time: 17 seconds

Radiation Exposure Index: 5.25 microGray*m^2

PROCEDURE:
The procedure, risks, benefits, and alternatives were explained to
the patient. Questions regarding the procedure were encouraged and
answered. The patient understands and consents to the procedure.

CERVICAL EPIDURAL INJECTION

An interlaminar approach was performed on the left at C7-T1. A
inch 20 gauge epidural needle was advanced using loss-of-resistance
technique.

DIAGNOSTIC EPIDURAL INJECTION

Injection of Isovue-M 300 shows a good epidural pattern with spread
above and below the level of needle placement, primarily on the
left. No vascular opacification is seen.

THERAPEUTIC EPIDURAL INJECTION

1.5 ml of Kenalog 40 mixed with 2 ml of normal saline were then
instilled. The procedure was well-tolerated, and the patient was
discharged thirty minutes following the injection in good condition.
IMPRESSION: Technically successful interlaminar epidural injection on the left
at C7-T1.

## 2020-03-26 MED ORDER — IOPAMIDOL (ISOVUE-M 300) INJECTION 61%
1.0000 mL | Freq: Once | INTRAMUSCULAR | Status: AC
  Administered 2020-03-26: 1 mL via EPIDURAL

## 2020-03-26 MED ORDER — TRIAMCINOLONE ACETONIDE 40 MG/ML IJ SUSP (RADIOLOGY)
60.0000 mg | Freq: Once | INTRAMUSCULAR | Status: AC
  Administered 2020-03-26: 60 mg via EPIDURAL

## 2020-03-26 NOTE — Discharge Instructions (Signed)
Spinal Injection Discharge Instruction Sheet  1. You may resume a regular diet and any medications that you routinely take, including pain medications.  2. No driving the rest of the day of the procedure.  3. Light activity throughout the rest of the day.  Do not do any strenuous work, exercise, bending or lifting.  The day following the procedure, you may resume normal physical activity but you should refrain from exercising or physical therapy for at least three days.   Common Side Effects:   Headaches- take your usual medications as directed by your physician.     Restlessness or inability to sleep- you may have trouble sleeping for the next few days.  Ask your referring physician if you need any medication for sleep if over the counter sleep medications do not help.   Facial flushing or redness- this should subside within a few days.   Increased pain- a temporary increase in pain a day or two following your procedure is not unusual.  Take your pain medication as prescribed by your referring physician.  You may use ice to the injection site as needed.  Please do not use heat for 24 hours.   Leg cramps  Please contact our office at [phone] for the following symptoms:  Fever greater than 100 degrees.  Headaches unresolved with medication after 2-3 days.  Increased swelling, pain, or redness at injection site.  Thank you for visiting our office.   You may resume Excedrin today.

## 2020-03-30 ENCOUNTER — Other Ambulatory Visit: Payer: Self-pay

## 2020-03-30 ENCOUNTER — Telehealth: Payer: Self-pay

## 2020-03-30 DIAGNOSIS — M542 Cervicalgia: Secondary | ICD-10-CM

## 2020-03-30 NOTE — Telephone Encounter (Signed)
Unfortunately Patients authorization for her epidural she had last week came back denied. Can you write a letter to try and appeal the denial and try and get it accepted please? I will then fax to the appeals department.

## 2020-04-05 ENCOUNTER — Other Ambulatory Visit: Payer: 59

## 2020-04-06 ENCOUNTER — Telehealth: Payer: Self-pay | Admitting: Family Medicine

## 2020-04-06 NOTE — Telephone Encounter (Signed)
Patient called stating that Dr Tamala Julian recommended that the patient have an Epidural. This was done on 03/26/2020. She received a denial from her insurance stating that this was not medically necessary. She said that she is in the process of filing an appeal but that something was suppose to come to [place] as well?  Can you help her with this?

## 2020-04-07 NOTE — Telephone Encounter (Signed)
I had sent a message to Dr. Tamala Julian asking if he could do a appeal letter to fax to the insurance company. I placed the paperwork back there of the denial and why it was denied. I had not heard or received anything back on that situation.

## 2020-04-07 NOTE — Telephone Encounter (Signed)
Unable to do a Peer to Peer because it had to be done by 03/27/2020 but he was not here and they only had time for peer to peer after hours. They stated that an appeal could be done by writing a letter. If patient has started this process the letter can be given to her to help with her appeal process.

## 2020-04-08 NOTE — Telephone Encounter (Signed)
Letter written and sent via [place]. Patient notified.

## 2020-04-08 NOTE — Telephone Encounter (Signed)
Can we write a letter stating that patient had acute onset of neck pain with radicular symptoms in the C6 distribution with the left side, decreased range of motion, positive Spurling's significantly worsening symptoms over the next 24 hours and was sent for MRI.  Patient was starting to have weakness of the upper extremity as well.  MRI was very consistent with the symptoms with more of a disc osteophyte complex as well as severe bilateral neuroforaminal narrowing and mild to moderate central stenosis.  For diagnostic and therapeutic purposes patient's elected to try an epidural which patient has responded well.  Epidural has allowed patient to avoid any surgical intervention mention and I will allow her to be able to participate more in formal physical therapy when patient's pain would have been unrelenting and unable to allow her to be active.

## 2020-04-16 ENCOUNTER — Other Ambulatory Visit: Payer: Self-pay

## 2020-04-16 ENCOUNTER — Encounter: Payer: Self-pay | Admitting: Physical Therapy

## 2020-04-16 ENCOUNTER — Ambulatory Visit: Payer: 59 | Attending: Family Medicine | Admitting: Physical Therapy

## 2020-04-16 DIAGNOSIS — R293 Abnormal posture: Secondary | ICD-10-CM

## 2020-04-16 DIAGNOSIS — M79602 Pain in left arm: Secondary | ICD-10-CM | POA: Diagnosis present

## 2020-04-16 DIAGNOSIS — M25512 Pain in left shoulder: Secondary | ICD-10-CM | POA: Insufficient documentation

## 2020-04-16 DIAGNOSIS — G8929 Other chronic pain: Secondary | ICD-10-CM | POA: Insufficient documentation

## 2020-04-16 DIAGNOSIS — M542 Cervicalgia: Secondary | ICD-10-CM | POA: Insufficient documentation

## 2020-04-16 NOTE — Therapy (Signed)
[place], [place], [address] Phone: [phone]   Fax:  [phone]  Physical Therapy Evaluation  Patient Details  Name: Kelly Gilbert MRN: [id number] Date of Birth: [birth year] Referring Provider (PT): Hulan Saas, DO   Encounter Date: 04/16/2020   PT End of Session - 04/16/20 1428    Visit Number 1    Number of Visits 13    Date for PT Re-Evaluation 05/30/20    Authorization Type [employer], no IONTO    6th visit FOTO    PT Start Time 9628    PT Stop Time 1502    PT Time Calculation (min) 46 min    Activity Tolerance Patient tolerated treatment well    Behavior During Therapy [facility] for tasks assessed/performed           Past Medical History:  Diagnosis Date  . Arthritis   . GERD (gastroesophageal reflux disease)   . Lesion of bladder   . Migraine headache   . PONV (postoperative nausea and vomiting)     Past Surgical History:  Procedure Laterality Date  . BENIGN LEFT BREASET BX  1992  . COLONOSCOPY    . CYSTOSCOPY WITH BIOPSY  01/16/2012   Procedure: CYSTOSCOPY WITH BIOPSY;  Surgeon: Molli Hazard, MD;  Location: [facility];  Service: Urology;  Laterality: N/A;  1 hour requested for this case  C-ARM CAMERA    . VAGINAL HYSTERECTOMY  1999   EPIDURAL ANES.    There were no vitals filed for this visit.    Subjective Assessment - 04/16/20 1420    Subjective Neck pain began after waking up one morning around March, 2021. I thought it was a crick in my neck. By the later part of the week I had shooting pain down Left arm and by Sunday was in tears due to pain. Had some discomfort in neck/shoulder area but my arm was where the post pain is. Points to lateral epicondyle moving distally for most pain. epidural has helped to where I only have mild discomfort. PT for Lt RC a few years ago. Was in pelvic floor PT but put that on hold due to fear that it could affect my neck but  is has helped my hips/lower back.    Diagnostic tests MRI: Cervical spine spondylosis most notable from C5 through C7 with severe bilateral neural foraminal narrowing and mild central canal stenosis.    Patient Stated Goals sleep, decrease pain,    Currently in Pain? Yes    Pain Score 1     Pain Location Shoulder    Pain Orientation Left    Pain Descriptors / Indicators Tingling;Aching    Pain Radiating Towards neck to Lt UE    Aggravating Factors  side sleeping    Pain Relieving Factors laying supine, holdin arm on head              [facility] PT Assessment - 04/16/20 0001      Assessment   Medical Diagnosis neck pain    Referring Provider (PT) Hulan Saas, DO    Onset Date/Surgical Date --   around March, 2021   Hand Dominance Right    Prior Therapy pelvic floor      Precautions   Precautions None      Restrictions   Weight Bearing Restrictions No      Balance Screen   Has the patient fallen in the past 6 months Yes    How  many times? 2    Has the patient had a decrease in activity level because of a fear of falling?  No    Is the patient reluctant to leave their home because of a fear of falling?  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      Cognition   Overall Cognitive Status Within Functional Limits for tasks assessed      Observation/Other Assessments   Focus on Therapeutic Outcomes (FOTO)  44% limitation      Sensation   Additional Comments a little N/T into Lt UE      Posture/Postural Control   Posture Comments Lt GHJ rounded forward slightly, decreased thoracic and cervical curve      ROM / Strength   AROM / PROM / Strength AROM      AROM   AROM Assessment Site Cervical    Cervical Flexion 42   pulling on Lt side, some pain   Cervical Extension 40     Cervical - Right Side Bend 24    Cervical - Left Side Bend 30    Cervical - Right Rotation 62    Cervical - Left Rotation 48                      Objective measurements completed on examination: See above findings.       [facility] PT Treatment/Exercise - 04/16/20 0001      Therapeutic Activites    Therapeutic Activities Other Therapeutic Activities    Other Therapeutic Activities pillow positioning in bed, scap retraction/resting postural alignment      Manual Therapy   Manual Therapy Soft tissue mobilization;Manual Traction    Soft tissue mobilization Lt upper trap & cervical paraspinals    Manual Traction cervical                  PT Education - 04/16/20 1524    Education Details anatomy of condition, POC, HEP, FOTO, exercise form/rationale    Person(s) Educated Patient    Methods Explanation;Demonstration;Tactile cues;Verbal cues    Comprehension Verbalized understanding;Returned demonstration;Verbal cues required;Tactile cues required;Need further instruction            PT Short Term Goals - 04/16/20 1535      PT SHORT TERM GOAL #1   Title pt will demo consistent retraction of scapula    Baseline discussed at eval    Time 3    Period Weeks    Status New    Target Date 05/07/20      PT SHORT TERM GOAL #2   Title pt will be independent in short term HEP without increase in neck/UE pain    Baseline will progress as appropriate    Time 3    Period Weeks    Status New    Target Date 05/07/20             PT Long Term Goals - 04/16/20 1528      PT LONG TERM GOAL #1   Title pt will be able to return to side sleeping without limitation by pain    Baseline began discussing sleep posture at eval    Time 6    Period Weeks    Status New  Target Date 05/30/20      PT LONG TERM GOAL #2   Title Cervical rotation & sidebend within 5 deg Rt to Lt    Baseline see flowsheet    Time 6    Period Weeks    Status New    Target Date  05/30/20      PT LONG TERM GOAL #3   Title resolution of Lt distal arm pain    Baseline wrist extensor tightness with s/s consistent with lateral epicondylitis    Time 6    Period Weeks    Status New    Target Date 05/30/20      PT LONG TERM GOAL #4   Title FOTO to 35% limitation    Baseline 44% limitation at eval    Time 6    Period Weeks    Status New    Target Date 05/30/20                  Plan - 04/16/20 1517    Clinical Impression Statement Pt presents to PT with complaints of cervical pain of insidious onset with radicular symptoms down Lt UE. UE ROM is WFL and while cervical AROM is limited (as oulined in flowsheet) PROM is [facility] and equal bilaterally with end range pain in Lt cervical rotation with a stretchy end feel. Tightness noted in deeper musculature of cervical paraspinals and Lt upper trap. Was unable to recreate radicular symptoms with palpation or PROM. Pt will benefit from skilled PT to decrease tension/guarding as well as correct postural alignment to encourage support to cervical spine. Advised that she will need to monitor symptoms of stenosis to determine if further medical treatments, such as surgery, will be warranted but we will hopefully avoid that. 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, h/o RC injury, h/o wrist injury both on Lt UE    Examination-Activity Limitations Sit;Sleep;Bend;Stand;Carry;Lift;Reach Overhead    Examination-Participation Restrictions Cleaning;Driving;Occupation    Stability/Clinical Decision Making Unstable/Unpredictable    Clinical Decision Making Moderate    Rehab Potential Good    PT Frequency 2x / week    PT Duration 6 weeks    PT Treatment/Interventions ADLs/Self  Care Home Management;Cryotherapy;Electrical Stimulation;Moist Heat;Traction;Therapeutic exercise;Therapeutic activities;Functional mobility training;Ultrasound;Neuromuscular re-education;Patient/family education;Manual techniques;Taping;Dry needling;Passive range of motion;Spinal Manipulations;Joint Manipulations    PT Next Visit Plan DN to cervical/upper trap & Lt wrist extensors, chest stretch, DNF training    PT Home Exercise Plan scap retraction/postural awareness, wrist extensor stretch    Consulted and Agree with Plan of Care Patient           Patient will benefit from skilled therapeutic intervention in order to improve the following deficits and impairments:  Decreased activity tolerance, Pain, Impaired UE functional use, Increased muscle spasms, Decreased range of motion, Improper body mechanics, Postural dysfunction  Visit Diagnosis: Cervicalgia - Plan: PT plan of care cert/re-cert  Chronic left shoulder pain - Plan: PT plan of care cert/re-cert  Pain in left arm - Plan: PT plan of care cert/re-cert  Abnormal posture - Plan: PT plan of care cert/re-cert     Problem List Patient Active Problem List   Diagnosis Date Noted  . Cervical disc disorder with radiculopathy of cervical region 03/17/2020  . Greater trochanteric bursitis  of both hips 10/23/2019  . Left hand paresthesia 07/02/2013  . ADD (attention deficit disorder) 03/18/2013  . Hematuria 03/18/2013  . History of migraine 10/12/2011  . DEPRESSION 09/23/2009  . GERD 09/23/2009    Cartrell Bentsen C. Chaunte Hornbeck PT, DPT 04/16/20 3:41 PM   [facility] [address] [place], [place], [address] Phone: [phone]   Fax:  [phone]  Name: Kelly Gilbert MRN: [id number] Date of Birth: 10-04-1960

## 2020-04-17 NOTE — Progress Notes (Signed)
[place] [place] Phone: [phone] Subjective:    I'm seeing this patient by the request  of:  Eulas Post, MD  CC: Neck pain follow-up  [id number]   03/17/2020 CT scan previously did show some very mild degenerative disc disease.  Patient does have unfortunately radicular symptoms in the C6-C7 distribution some mild condition.  Patient is already on prednisone.  Toradol and Depo-Medrol given today, warned of potential side effects.  I do believe with the weakness we do need an MRI and patient could be a candidate for possible epidurals.  Depending on these findings treatment options after the imaging.  Patient is in agreement with the plan with worsening pain patient knows to seek medical attention immediately  Update 04/21/2020 Kelly Gilbert is a 59 y.o. female coming in with complaint of neck pain. Epidural 03/26/2020. Patient states she is doing well. Epidural helped. First PT appointment was last week.  Overall seems to be improving.  States approximately 85 to 90% better  Patient's MRI did show when patient did have cervical spine spondylosis from C5-C7 with severe bilateral neuronal foraminal narrowing.  Underwent an epidural March 26, 2020.    Past Medical History:  Diagnosis Date  . Arthritis   . GERD (gastroesophageal reflux disease)   . Lesion of bladder   . Migraine headache   . PONV (postoperative nausea and vomiting)    Past Surgical History:  Procedure Laterality Date  . BENIGN LEFT BREASET BX  1992  . COLONOSCOPY    . CYSTOSCOPY WITH BIOPSY  01/16/2012   Procedure: CYSTOSCOPY WITH BIOPSY;  Surgeon: Molli Hazard, MD;  Location: [facility];  Service: Urology;  Laterality: N/A;  1 hour requested for this case  C-ARM CAMERA    . VAGINAL HYSTERECTOMY  1999   EPIDURAL ANES.   Social History   Socioeconomic History  . Marital status: Married    Spouse name: Not  on file  . Number of children: 2  . Years of education: Not on file  . Highest education level: Not on file  Occupational History    Comment: Realtor  Tobacco Use  . Smoking status: Current Every Day Smoker    Packs/day: 0.25    Years: 35.00    Pack years: 8.75    Types: Cigarettes  . Smokeless tobacco: Never Used  . Tobacco comment: QUIT SMOKING 2008--  RECENTLY STARTED BACK 7-8 CIG. PER DAY  Vaping Use  . Vaping Use: Never used  Substance and Sexual Activity  . Alcohol use: Yes    Alcohol/week: 2.0 standard drinks    Types: 2 Shots of liquor per week    Comment: Occasional  . Drug use: Never  . Sexual activity: Not on file  Other Topics Concern  . Not on file  Social History Narrative  . Not on file   Social Determinants of Health   Financial Resource Strain:   . Difficulty of Paying Living Expenses: Not on file  Food Insecurity:   . Worried About Charity fundraiser in the Last Year: Not on file  . Ran Out of Food in the Last Year: Not on file  Transportation Needs:   . Lack of Transportation (Medical): Not on file  . Lack of Transportation (Non-Medical): Not on file  Physical Activity:   . Days of Exercise per Week: Not on file  . Minutes of Exercise per Session: Not on file  Stress:   .  Feeling of Stress : Not on file  Social Connections:   . Frequency of Communication with Friends and Family: Not on file  . Frequency of Social Gatherings with Friends and Family: Not on file  . Attends Religious Services: Not on file  . Active Member of Clubs or Organizations: Not on file  . Attends Archivist Meetings: Not on file  . Marital Status: Not on file   Allergies  Allergen Reactions  . Codeine Sulfate Nausea And Vomiting  . Sulfa Antibiotics Hives and Swelling   Family History  Problem Relation Age of Onset  . Hypertension Father   . Cancer Father        prostate  . Hypertension Maternal Grandmother   . Cancer Mother 52       renal cell cancer    . Colon cancer Neg Hx   . Pancreatic cancer Neg Hx   . Stomach cancer Neg Hx   . Esophageal cancer Neg Hx           Current Outpatient Medications (Analgesics):  Marland Kitchen  Aspirin-Acetaminophen-Caffeine (EXCEDRIN PO), Take 1 tablet by mouth daily as needed (headache).  .  meloxicam (MOBIC) 15 MG tablet, Take 1 tablet (15 mg total) by mouth daily.     Current Outpatient Medications (Other):  .  amphetamine-dextroamphetamine (ADDERALL) 15 MG tablet, Take 1 tablet by mouth 2 (two) times daily. May refill in one month (Patient taking differently: Take 15 mg by mouth daily as needed (adhd). May refill in one month) .  Biotin 5000 MCG CAPS, Take 1 capsule by mouth every evening.  .  Calcium Carb-Cholecalciferol (CALCIUM 600 + D PO), Take by mouth daily. .  cholecalciferol (VITAMIN D3) 25 MCG (1000 UNIT) tablet, Take 1,000 Units by mouth daily. Marland Kitchen  gabapentin (NEURONTIN) 100 MG capsule, Take 2 capsules (200 mg total) by mouth at bedtime. .  gabapentin (NEURONTIN) 100 MG capsule, Take 2 capsules (200 mg total) by mouth at bedtime. Marland Kitchen  imipramine (TOFRANIL) 25 MG tablet, Take 1 tablet (25 mg total) by mouth at bedtime. Marland Kitchen  L-LYSINE PO, Take 1,000 mg by mouth daily. .  magnesium 30 MG tablet, Take 30 mg by mouth daily. .  Multiple Vitamin (MULTIVITAMIN PO), Take 1 capsule by mouth daily. .  NON FORMULARY, Doterra- alpha CRS and Cellular Vitality complex- 2 daily .  NON FORMULARY, Doterra Micro Plex VMz Food Nutrient complex- 2 daily .  NON FORMULARY, Doterra- EO Mega essential oil omega complex-  2 daily .  omeprazole (PRILOSEC) 20 MG capsule, Take 20 mg by mouth daily. Marland Kitchen  PENNSAID 2 % SOLN, APPLY TWO PUMPS (40MG ) TOPICALLY TO AFFECTED AREA(S) TWICE A DAY .  triamcinolone cream (KENALOG) 0.1 %, Apply 1 application topically 2 (two) times daily. For 1-2 weeks. (Patient taking differently: Apply 1 application topically 2 (two) times daily as needed (rash). ) .  TURMERIC PO, Take 2 capsules by  mouth. .  vitamin C (ASCORBIC ACID) 250 MG tablet, Take 500 mg by mouth daily.  Current Facility-Administered Medications (Other):  .  0.9 %  sodium chloride infusion   Reviewed prior external information including notes and imaging from  primary care provider As well as notes that were available from care everywhere and other healthcare systems.  Past medical history, social, surgical and family history all reviewed in electronic medical record.  No pertanent information unless stated regarding to the chief complaint.   Review of Systems:  No headache, visual changes, nausea, vomiting, diarrhea, constipation,  dizziness, abdominal pain, skin rash, fevers, chills, night sweats, weight loss, swollen lymph nodes, body aches, joint swelling, chest pain, shortness of breath, mood changes. POSITIVE muscle aches  Objective  Blood pressure 110/80, pulse 85, height 5\' 7"  (1.702 m), weight 176 lb (79.8 kg), SpO2 96 %.   General: No apparent distress alert and oriented x3 mood and affect normal, dressed appropriately.  HEENT: Pupils equal, extraocular movements intact  Respiratory: Patient's speak in full sentences and does not appear short of breath  Cardiovascular: No lower extremity edema, non tender, no erythema  Neuro: Cranial nerves II through XII are intact, neurovascularly intact in all extremities with 2+ DTRs and 2+ pulses.  Gait normal with good balance and coordination.  MSK:  Non tender with full range of motion and good stability and symmetric strength and tone of shoulders, elbows, wrist, hip, knee and ankles bilaterally.  Neck: Inspection mild loss of lordosis. No palpable stepoffs. Negative Spurling's maneuver. Very mild limited in extension of 5 degrees. Grip strength and sensation normal in bilateral hands Strength good C4 to T1 distribution No sensory change to C4 to T1 Negative Hoffman sign bilaterally Reflexes normal     Impression and Recommendations:     The  above documentation has been reviewed and is accurate and complete Lyndal Pulley, DO       Note: This dictation was prepared with Dragon dictation along with smaller phrase technology. Any transcriptional errors that result from this process are unintentional.

## 2020-04-21 ENCOUNTER — Encounter: Payer: Self-pay | Admitting: Family Medicine

## 2020-04-21 ENCOUNTER — Ambulatory Visit (INDEPENDENT_AMBULATORY_CARE_PROVIDER_SITE_OTHER): Payer: 59 | Admitting: Family Medicine

## 2020-04-21 ENCOUNTER — Other Ambulatory Visit: Payer: Self-pay

## 2020-04-21 DIAGNOSIS — M7062 Trochanteric bursitis, left hip: Secondary | ICD-10-CM

## 2020-04-21 DIAGNOSIS — M7061 Trochanteric bursitis, right hip: Secondary | ICD-10-CM | POA: Diagnosis not present

## 2020-04-21 DIAGNOSIS — M5010 Cervical disc disorder with radiculopathy, unspecified cervical region: Secondary | ICD-10-CM | POA: Diagnosis not present

## 2020-04-21 NOTE — Assessment & Plan Note (Signed)
Patient responded very well to epidural at this time and has started with physical therapy.  I do believe patient will do well with conservative therapy.  Encourage the possible continuation of the gabapentin.  We can consider the possibility of repeating epidurals if necessary.  Follow-up with me again in 6 weeks

## 2020-04-21 NOTE — Assessment & Plan Note (Signed)
Do not need to discuss too much seems to be doing well.  Patient has started physical therapy for pelvic floor.

## 2020-04-21 NOTE — Patient Instructions (Signed)
Good to see you Glad you are doing better [facility] with you starting both PT Think you will do well with dry needling No change in meds See me again in 6 weeks

## 2020-04-30 ENCOUNTER — Ambulatory Visit: Payer: 59 | Admitting: Physical Therapy

## 2020-04-30 ENCOUNTER — Encounter: Payer: Self-pay | Admitting: Physical Therapy

## 2020-04-30 ENCOUNTER — Other Ambulatory Visit: Payer: Self-pay

## 2020-04-30 DIAGNOSIS — M79602 Pain in left arm: Secondary | ICD-10-CM

## 2020-04-30 DIAGNOSIS — M542 Cervicalgia: Secondary | ICD-10-CM

## 2020-04-30 DIAGNOSIS — G8929 Other chronic pain: Secondary | ICD-10-CM

## 2020-04-30 DIAGNOSIS — R293 Abnormal posture: Secondary | ICD-10-CM

## 2020-04-30 NOTE — Patient Instructions (Signed)

## 2020-04-30 NOTE — Therapy (Signed)
[place], [place], [address] Phone: [phone]   Fax:  [phone]  Physical Therapy Treatment  Patient Details  Name: Kelly Gilbert MRN: [id number] Date of Birth: [birth year] Referring Provider (PT): Hulan Saas, DO   Encounter Date: 04/30/2020   PT End of Session - 04/30/20 1721    Visit Number 2    Number of Visits 13    Date for PT Re-Evaluation 05/30/20    Authorization Type Aetna, no IONTO    6th visit FOTO    PT Start Time 1548    PT Stop Time 1630   time spent dry needling not included in direct minutes   PT Time Calculation (min) 42 min    Activity Tolerance Patient tolerated treatment well    Behavior During Therapy [facility] for tasks assessed/performed           Past Medical History:  Diagnosis Date  . Arthritis   . GERD (gastroesophageal reflux disease)   . Lesion of bladder   . Migraine headache   . PONV (postoperative nausea and vomiting)     Past Surgical History:  Procedure Laterality Date  . BENIGN LEFT BREASET BX  1992  . COLONOSCOPY    . CYSTOSCOPY WITH BIOPSY  01/16/2012   Procedure: CYSTOSCOPY WITH BIOPSY;  Surgeon: Molli Hazard, MD;  Location: [facility];  Service: Urology;  Laterality: N/A;  1 hour requested for this case  C-ARM CAMERA    . VAGINAL HYSTERECTOMY  1999   EPIDURAL ANES.    There were no vitals filed for this visit.   Subjective Assessment - 04/30/20 1716    Subjective Still having left upper trapezius region pain with intermittent radiating distally down left UE. Symptoms eased with left shoulder in position of abduction and ER. Pt. consents to trial dry needling today.                             [facility] Adult PT Treatment/Exercise - 04/30/20 0001      Exercises   Exercises Neck      Neck Exercises: Seated   Neck Retraction 15 reps      Manual Therapy   Manual Traction cervical      Neck  Exercises: Stretches   Upper Trapezius Stretch Left;2 reps;20 seconds    Levator Stretch Left;2 reps;20 seconds    Other Neck Stretches seated cervical flexion stretch 10 sec x 5            Trigger Point Dry Needling - 04/30/20 0001    Consent Given? Yes    Education Handout Provided Yes    Muscles Treated Head and Neck Upper trapezius;Levator scapulae;Cervical multifidi   left side   Muscles Treated Wrist/Hand Extensor carpi radialis longus/brevis   left side   Dry Needling Comments needling in prone for cervical muscles as noted with 32 gauge 30 mm needles excepting 32 gauge 50 mm needle for cervical multifidi C5-6 region, needling in supine for left ECRB/ECRL with 32 gauge 30 mm needle    Upper Trapezius Response Twitch reponse elicited    Levator Scapulae Response Twitch response elicited                PT Education - 04/30/20 1721    Education Details dry needling, spine anatomy with symptom etiology for stenosis, HEP updates    Person(s) Educated Patient    Methods Explanation;Demonstration;Verbal cues;Handout  Comprehension Verbalized understanding;Returned demonstration            PT Short Term Goals - 04/16/20 1535      PT SHORT TERM GOAL #1   Title pt will demo consistent retraction of scapula    Baseline discussed at eval    Time 3    Period Weeks    Status New    Target Date 05/07/20      PT SHORT TERM GOAL #2   Title pt will be independent in short term HEP without increase in neck/UE pain    Baseline will progress as appropriate    Time 3    Period Weeks    Status New    Target Date 05/07/20             PT Long Term Goals - 04/16/20 1528      PT LONG TERM GOAL #1   Title pt will be able to return to side sleeping without limitation by pain    Baseline began discussing sleep posture at eval    Time 6    Period Weeks    Status New    Target Date 05/30/20      PT LONG TERM GOAL #2   Title Cervical rotation & sidebend within 5 deg Rt  to Lt    Baseline see flowsheet    Time 6    Period Weeks    Status New    Target Date 05/30/20      PT LONG TERM GOAL #3   Title resolution of Lt distal arm pain    Baseline wrist extensor tightness with s/s consistent with lateral epicondylitis    Time 6    Period Weeks    Status New    Target Date 05/30/20      PT LONG TERM GOAL #4   Title FOTO to 35% limitation    Baseline 44% limitation at eval    Time 6    Period Weeks    Status New    Target Date 05/30/20                 Plan - 04/30/20 1722    Clinical Impression Statement For exercises worked on flexion bias cervical ROM given underlying stenosis as well as stretches for left upper trapezius and levator region. Exercises well-tolerated but minimal left UE symptoms at time of session so limited ability to assess for centralization response. Continued manual traction which was also well-tolerated. Trial dry needling today as noted per flowsheet-mild soreness in particular with upper trapezius region needling but overall procedure also well-tolerated. Will await further tx. response by next session for both dry needling and flexion bias ROM and progress as tolerated pending response.    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, h/o RC injury, h/o wrist injury both on Lt UE    Examination-Activity Limitations Sit;Sleep;Bend;Stand;Carry;Lift;Reach Overhead    Examination-Participation Restrictions Cleaning;Driving;Occupation    Stability/Clinical Decision Making Unstable/Unpredictable    Clinical Decision Making Moderate    Rehab Potential Good    PT Frequency 2x / week    PT Duration 6 weeks    PT Treatment/Interventions ADLs/Self Care Home Management;Cryotherapy;Electrical Stimulation;Moist Heat;Traction;Therapeutic exercise;Therapeutic activities;Functional mobility training;Ultrasound;Neuromuscular re-education;Patient/family  education;Manual techniques;Taping;Dry needling;Passive range of motion;Spinal Manipulations;Joint Manipulations    PT Next Visit Plan continue flexion bias cervical ROM, add DNF endurance/stabilization, possible left UE nerve glides, chest stretch, manual traction, further dry needling prn  PT Home Exercise Plan scap retraction/postural awareness, wrist extensor stretch, cervical retractions, cervical flexion stretch, left upper trapezius and levator stretch           Patient will benefit from skilled therapeutic intervention in order to improve the following deficits and impairments:  Decreased activity tolerance, Pain, Impaired UE functional use, Increased muscle spasms, Decreased range of motion, Improper body mechanics, Postural dysfunction  Visit Diagnosis: Cervicalgia  Chronic left shoulder pain  Pain in left arm  Abnormal posture     Problem List Patient Active Problem List   Diagnosis Date Noted  . Cervical disc disorder with radiculopathy of cervical region 03/17/2020  . Greater trochanteric bursitis of both hips 10/23/2019  . Left hand paresthesia 07/02/2013  . ADD (attention deficit disorder) 03/18/2013  . Hematuria 03/18/2013  . History of migraine 10/12/2011  . DEPRESSION 09/23/2009  . GERD 09/23/2009    Beaulah Dinning, PT, DPT 04/30/20 5:29 PM  [facility] [address] [place], [place], [address] Phone: [phone]   Fax:  [phone]  Name: ASPASIA RUDE MRN: [id number] Date of Birth: 1961-03-02

## 2020-05-05 ENCOUNTER — Ambulatory Visit: Payer: 59 | Admitting: Physical Therapy

## 2020-05-05 ENCOUNTER — Other Ambulatory Visit: Payer: Self-pay

## 2020-05-05 ENCOUNTER — Encounter: Payer: Self-pay | Admitting: Physical Therapy

## 2020-05-05 DIAGNOSIS — M542 Cervicalgia: Secondary | ICD-10-CM

## 2020-05-05 DIAGNOSIS — M79602 Pain in left arm: Secondary | ICD-10-CM

## 2020-05-05 DIAGNOSIS — R293 Abnormal posture: Secondary | ICD-10-CM

## 2020-05-05 DIAGNOSIS — G8929 Other chronic pain: Secondary | ICD-10-CM

## 2020-05-05 NOTE — Therapy (Signed)
[place], [place], [address] Phone: [phone]   Fax:  [phone]  Physical Therapy Treatment  Patient Details  Name: Kelly Gilbert MRN: [id number] Date of Birth: October 17, 1960 Referring Provider (PT): Hulan Saas, DO   Encounter Date: 05/05/2020   PT End of Session - 05/05/20 1500    Visit Number 3    Number of Visits 13    Date for PT Re-Evaluation 05/30/20    Authorization Type Aetna, no IONTO    6th visit FOTO    PT Start Time 7062    PT Stop Time 1504    PT Time Calculation (min) 48 min    Activity Tolerance Patient tolerated treatment well    Behavior During Therapy [facility] for tasks assessed/performed           Past Medical History:  Diagnosis Date  . Arthritis   . GERD (gastroesophageal reflux disease)   . Lesion of bladder   . Migraine headache   . PONV (postoperative nausea and vomiting)     Past Surgical History:  Procedure Laterality Date  . BENIGN LEFT BREASET BX  1992  . COLONOSCOPY    . CYSTOSCOPY WITH BIOPSY  01/16/2012   Procedure: CYSTOSCOPY WITH BIOPSY;  Surgeon: Molli Hazard, MD;  Location: [facility];  Service: Urology;  Laterality: N/A;  1 hour requested for this case  C-ARM CAMERA    . VAGINAL HYSTERECTOMY  1999   EPIDURAL ANES.    There were no vitals filed for this visit.   Subjective Assessment - 05/05/20 1457    Subjective Pt. reports some soreness after dry needling last session, not much difference in terms of improvement. Still having some residual soreness so discusssed would hold dry needling today. Also noting headache this PM (mostly frontal/temporal region).    Currently in Pain? Yes    Pain Score --   2-3/10   Pain Location Shoulder    Pain Orientation Left    Pain Descriptors / Indicators Tingling;Aching    Pain Radiating Towards neck to left UE    Aggravating Factors  side sleeping    Pain Relieving Factors supine  positioning, holding arm on head                             [facility] Adult PT Treatment/Exercise - 05/05/20 0001      Neck Exercises: Theraband   Shoulder Extension 15 reps;Green    Rows 15 reps;Green    Horizontal ABduction 15 reps;Red      Neck Exercises: Supine   Neck Retraction 20 reps    Capital Flexion 20 reps    Capital Flexion Limitations supine retraction to assisted flexion x 20 reps    Other Supine Exercise supine deep neck flexor holds x 10 reps      Modalities   Modalities Traction      Traction   Type of Traction Cervical    Min (lbs) 0    Max (lbs) 10    Hold Time 60    Rest Time 20    Time 10      Manual Therapy   Manual Therapy Myofascial release;Neural Stretch    Soft tissue mobilization suboccpipital release    Neural Stretch Left median, ulnar and radial nerve glides x 15 ea.      Neck Exercises: Stretches   Upper Trapezius Stretch Left;3 reps;20 seconds    Levator  Stretch Left;3 reps;20 seconds                  PT Education - 05/05/20 1459    Education Details traction, exercises    Person(s) Educated Patient    Methods Explanation;Demonstration;Verbal cues    Comprehension Verbalized understanding;Returned demonstration            PT Short Term Goals - 04/16/20 1535      PT SHORT TERM GOAL #1   Title pt will demo consistent retraction of scapula    Baseline discussed at eval    Time 3    Period Weeks    Status New    Target Date 05/07/20      PT SHORT TERM GOAL #2   Title pt will be independent in short term HEP without increase in neck/UE pain    Baseline will progress as appropriate    Time 3    Period Weeks    Status New    Target Date 05/07/20             PT Long Term Goals - 04/16/20 1528      PT LONG TERM GOAL #1   Title pt will be able to return to side sleeping without limitation by pain    Baseline began discussing sleep posture at eval    Time 6    Period Weeks    Status New     Target Date 05/30/20      PT LONG TERM GOAL #2   Title Cervical rotation & sidebend within 5 deg Rt to Lt    Baseline see flowsheet    Time 6    Period Weeks    Status New    Target Date 05/30/20      PT LONG TERM GOAL #3   Title resolution of Lt distal arm pain    Baseline wrist extensor tightness with s/s consistent with lateral epicondylitis    Time 6    Period Weeks    Status New    Target Date 05/30/20      PT LONG TERM GOAL #4   Title FOTO to 35% limitation    Baseline 44% limitation at eval    Time 6    Period Weeks    Status New    Target Date 05/30/20                 Plan - 05/05/20 1500    Clinical Impression Statement Held dry needling as noted in subjective due to soreness. Continued flexion bias exercises for underlying stenosis and included trial mechanical traction today overall with good tolerance excepting some brief pain into thoracic region when initially attempting supine DNF hold. Also added nerve glides today with primary tension noted in left medan nerve. Progress still ongoing with therapy goals.    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, h/o RC injury, h/o wrist injury both on Lt UE    Examination-Activity Limitations Sit;Sleep;Bend;Stand;Carry;Lift;Reach Overhead    Examination-Participation Restrictions Cleaning;Driving;Occupation    Stability/Clinical Decision Making Unstable/Unpredictable    Clinical Decision Making Moderate    Rehab Potential Good    PT Frequency 2x / week    PT Duration 6 weeks    PT Treatment/Interventions ADLs/Self Care Home Management;Cryotherapy;Electrical Stimulation;Moist Heat;Traction;Therapeutic exercise;Therapeutic activities;Functional mobility training;Ultrasound;Neuromuscular re-education;Patient/family education;Manual techniques;Taping;Dry needling;Passive range of motion;Spinal Manipulations;Joint Manipulations    PT  Next Visit Plan check response mechanical traction and continue/increase  as tolerated, continue flexion bias ROM, stretches, postural stability, further dry needling prn    PT Home Exercise Plan scap retraction/postural awareness, wrist extensor stretch, cervical retractions, cervical flexion stretch, left upper trapezius and levator stretch    Consulted and Agree with Plan of Care Patient           Patient will benefit from skilled therapeutic intervention in order to improve the following deficits and impairments:  Decreased activity tolerance, Pain, Impaired UE functional use, Increased muscle spasms, Decreased range of motion, Improper body mechanics, Postural dysfunction  Visit Diagnosis: Cervicalgia  Chronic left shoulder pain  Pain in left arm  Abnormal posture     Problem List Patient Active Problem List   Diagnosis Date Noted  . Cervical disc disorder with radiculopathy of cervical region 03/17/2020  . Greater trochanteric bursitis of both hips 10/23/2019  . Left hand paresthesia 07/02/2013  . ADD (attention deficit disorder) 03/18/2013  . Hematuria 03/18/2013  . History of migraine 10/12/2011  . DEPRESSION 09/23/2009  . GERD 09/23/2009    Beaulah Dinning, PT, DPT 05/05/20 3:07 PM  [facility] [address] [place], [place], [address] Phone: [phone]   Fax:  [phone]  Name: MACKENSIE PILSON MRN: [id number] Date of Birth: [birth year]

## 2020-05-07 ENCOUNTER — Encounter: Payer: Self-pay | Admitting: Physical Therapy

## 2020-05-07 ENCOUNTER — Other Ambulatory Visit: Payer: Self-pay

## 2020-05-07 ENCOUNTER — Ambulatory Visit: Payer: 59 | Admitting: Physical Therapy

## 2020-05-07 DIAGNOSIS — M542 Cervicalgia: Secondary | ICD-10-CM | POA: Diagnosis not present

## 2020-05-07 DIAGNOSIS — G8929 Other chronic pain: Secondary | ICD-10-CM

## 2020-05-07 DIAGNOSIS — M79602 Pain in left arm: Secondary | ICD-10-CM

## 2020-05-07 DIAGNOSIS — R293 Abnormal posture: Secondary | ICD-10-CM

## 2020-05-07 NOTE — Therapy (Signed)
[place], [place], [address] Phone: [phone]   Fax:  [phone]  Physical Therapy Treatment  Patient Details  Name: Kelly Gilbert MRN: [id number] Date of Birth: 1961/04/21 Referring Provider (PT): Hulan Saas, DO   Encounter Date: 05/07/2020   PT End of Session - 05/07/20 1627    Visit Number 4    Number of Visits 13    Date for PT Re-Evaluation 05/30/20    Authorization Type Aetna, no IONTO    6th visit FOTO    PT Start Time 1546    PT Stop Time 1632    PT Time Calculation (min) 46 min    Activity Tolerance Patient tolerated treatment well    Behavior During Therapy [facility] for tasks assessed/performed           Past Medical History:  Diagnosis Date  . Arthritis   . GERD (gastroesophageal reflux disease)   . Lesion of bladder   . Migraine headache   . PONV (postoperative nausea and vomiting)     Past Surgical History:  Procedure Laterality Date  . BENIGN LEFT BREASET BX  1992  . COLONOSCOPY    . CYSTOSCOPY WITH BIOPSY  01/16/2012   Procedure: CYSTOSCOPY WITH BIOPSY;  Surgeon: Molli Hazard, MD;  Location: [facility];  Service: Urology;  Laterality: N/A;  1 hour requested for this case  C-ARM CAMERA    . VAGINAL HYSTERECTOMY  1999   EPIDURAL ANES.    There were no vitals filed for this visit.   Subjective Assessment - 05/07/20 1627    Subjective Traction felt OK last session with no adverve effects after but still having some left UE nerve symptoms.              [facility] PT Assessment - 05/07/20 0001      AROM   Cervical - Right Side Bend 32    Cervical - Left Side Bend 35    Cervical - Right Rotation 65    Cervical - Left Rotation 55      Special Tests   Other special tests Lhermitte's sign (-)                         [facility] Adult PT Treatment/Exercise - 05/07/20 0001      Neck Exercises: Supine   Neck Retraction 20 reps     Capital Flexion 20 reps    Capital Flexion Limitations supine retraction to assisted flexion x 20 reps      Traction   Type of Traction Cervical    Min (lbs) 4    Max (lbs) 12    Hold Time 60    Rest Time 20    Time 12      Manual Therapy   Soft tissue mobilization left upper trapezius region    Neural Stretch left median nerve glides x 15 reps   no tension noted in ulnar nerve so held ulnar nerve glide     Neck Exercises: Stretches   Upper Trapezius Stretch Left;2 reps;30 seconds    Levator Stretch Left;2 reps;30 seconds    Chest Stretch --   supine manual pec minor stretch 20 sec x 3                 PT Education - 05/07/20 1624    Education Details HEP updates    Person(s) Educated Patient    Methods Explanation;Handout  Comprehension Verbalized understanding            PT Short Term Goals - 04/16/20 1535      PT SHORT TERM GOAL #1   Title pt will demo consistent retraction of scapula    Baseline discussed at eval    Time 3    Period Weeks    Status New    Target Date 05/07/20      PT SHORT TERM GOAL #2   Title pt will be independent in short term HEP without increase in neck/UE pain    Baseline will progress as appropriate    Time 3    Period Weeks    Status New    Target Date 05/07/20             PT Long Term Goals - 04/16/20 1528      PT LONG TERM GOAL #1   Title pt will be able to return to side sleeping without limitation by pain    Baseline began discussing sleep posture at eval    Time 6    Period Weeks    Status New    Target Date 05/30/20      PT LONG TERM GOAL #2   Title Cervical rotation & sidebend within 5 deg Rt to Lt    Baseline see flowsheet    Time 6    Period Weeks    Status New    Target Date 05/30/20      PT LONG TERM GOAL #3   Title resolution of Lt distal arm pain    Baseline wrist extensor tightness with s/s consistent with lateral epicondylitis    Time 6    Period Weeks    Status New    Target Date  05/30/20      PT LONG TERM GOAL #4   Title FOTO to 35% limitation    Baseline 44% limitation at eval    Time 6    Period Weeks    Status New    Target Date 05/30/20                 Plan - 05/07/20 1629    Clinical Impression Statement Pt. had some brief radiating pain into upper back with upper trap stretch (eased after rest). Checked Lhermitte's sign which was (-). Able to progress traction to 12 lbs. with good tolerance and mild improvement in cervical ROM from baseline but otherwise goal progess ongoing.    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, h/o RC injury, h/o wrist injury both on Lt UE    Examination-Activity Limitations Sit;Sleep;Bend;Stand;Carry;Lift;Reach Overhead    Examination-Participation Restrictions Cleaning;Driving;Occupation    Stability/Clinical Decision Making Unstable/Unpredictable    Clinical Decision Making Moderate    Rehab Potential Good    PT Frequency 2x / week    PT Duration 6 weeks    PT Treatment/Interventions ADLs/Self Care Home Management;Cryotherapy;Electrical Stimulation;Moist Heat;Traction;Therapeutic exercise;Therapeutic activities;Functional mobility training;Ultrasound;Neuromuscular re-education;Patient/family education;Manual techniques;Taping;Dry needling;Passive range of motion;Spinal Manipulations;Joint Manipulations    PT Next Visit Plan Continue mechanical traction as tolerated, continue flexion bias ROM, stretches, postural stability, further dry needling prn    PT Home Exercise Plan scap retraction/postural awareness, wrist extensor stretch, cervical retractions, cervical flexion stretch, left upper trapezius and levator stretch, Theraban rows, extension and supine horizontal abduction    Consulted and Agree with Plan of Care Patient           Patient will  benefit from skilled therapeutic intervention in order to improve the following  deficits and impairments:  Decreased activity tolerance, Pain, Impaired UE functional use, Increased muscle spasms, Decreased range of motion, Improper body mechanics, Postural dysfunction  Visit Diagnosis: Cervicalgia  Chronic left shoulder pain  Pain in left arm  Abnormal posture     Problem List Patient Active Problem List   Diagnosis Date Noted  . Cervical disc disorder with radiculopathy of cervical region 03/17/2020  . Greater trochanteric bursitis of both hips 10/23/2019  . Left hand paresthesia 07/02/2013  . ADD (attention deficit disorder) 03/18/2013  . Hematuria 03/18/2013  . History of migraine 10/12/2011  . DEPRESSION 09/23/2009  . GERD 09/23/2009    Beaulah Dinning, PT, DPT 05/07/20 4:33 PM  [facility] [address] [place], [place], [address] Phone: [phone]   Fax:  [phone]  Name: Kelly Gilbert MRN: [id number] Date of Birth: 04/26/1961

## 2020-05-11 ENCOUNTER — Encounter: Payer: 59 | Admitting: Physical Therapy

## 2020-05-12 ENCOUNTER — Ambulatory Visit: Payer: 59 | Attending: Family Medicine | Admitting: Physical Therapy

## 2020-05-12 ENCOUNTER — Encounter: Payer: Self-pay | Admitting: Physical Therapy

## 2020-05-12 ENCOUNTER — Other Ambulatory Visit: Payer: Self-pay

## 2020-05-12 DIAGNOSIS — M542 Cervicalgia: Secondary | ICD-10-CM | POA: Diagnosis present

## 2020-05-12 DIAGNOSIS — M25512 Pain in left shoulder: Secondary | ICD-10-CM | POA: Diagnosis present

## 2020-05-12 DIAGNOSIS — G8929 Other chronic pain: Secondary | ICD-10-CM | POA: Diagnosis present

## 2020-05-12 DIAGNOSIS — M79602 Pain in left arm: Secondary | ICD-10-CM | POA: Diagnosis present

## 2020-05-12 DIAGNOSIS — R293 Abnormal posture: Secondary | ICD-10-CM | POA: Diagnosis present

## 2020-05-12 NOTE — Therapy (Signed)
[place], [place], [address] Phone: [phone]   Fax:  [phone]  Physical Therapy Treatment  Patient Details  Name: Kelly Gilbert MRN: [id number] Date of Birth: 21-Dec-1960 Referring Provider (PT): Hulan Saas, DO   Encounter Date: 05/12/2020   PT End of Session - 05/12/20 1430    Visit Number 5    Number of Visits 13    Date for PT Re-Evaluation 05/30/20    Authorization Type Aetna, no IONTO    6th visit FOTO    PT Start Time 5170    PT Stop Time 1459    PT Time Calculation (min) 43 min    Activity Tolerance Patient tolerated treatment well    Behavior During Therapy [facility] for tasks assessed/performed           Past Medical History:  Diagnosis Date  . Arthritis   . GERD (gastroesophageal reflux disease)   . Lesion of bladder   . Migraine headache   . PONV (postoperative nausea and vomiting)     Past Surgical History:  Procedure Laterality Date  . BENIGN LEFT BREASET BX  1992  . COLONOSCOPY    . CYSTOSCOPY WITH BIOPSY  01/16/2012   Procedure: CYSTOSCOPY WITH BIOPSY;  Surgeon: Molli Hazard, MD;  Location: [facility];  Service: Urology;  Laterality: N/A;  1 hour requested for this case  C-ARM CAMERA    . VAGINAL HYSTERECTOMY  1999   EPIDURAL ANES.    There were no vitals filed for this visit.   Subjective Assessment - 05/12/20 1425    Subjective "Discomfort">overt pain in neck/left upper trapezius region and left arm. Some of the effects of epidural seem to have worn off but pain level not as severe as prior to injection.    Currently in Pain? Yes    Pain Score 3     Pain Location Shoulder    Pain Orientation Left    Pain Descriptors / Indicators Discomfort    Pain Radiating Towards neck to left UE    Pain Onset More than a month ago    Aggravating Factors  side sleeping    Pain Relieving Factors supine positioning and holding arm on head                              [facility] Adult PT Treatment/Exercise - 05/12/20 0001      Neck Exercises: Supine   Neck Retraction 20 reps    Capital Flexion 20 reps    Capital Flexion Limitations supine retraction to assisted flexion x 20 reps      Traction   Type of Traction Cervical    Min (lbs) 12   started at 8 lbs. but increased to 12    Max (lbs) 20   started at 15 lbs. but increased to 20   Hold Time 60    Rest Time 20    Time 12      Manual Therapy   Manual Therapy Joint mobilization    Joint Mobilization thoarcic PAs grade I-IV    Soft tissue mobilization left upper trapezius and levator region in prone      Neck Exercises: Stretches   Upper Trapezius Stretch Left;3 reps;20 seconds    Levator Stretch Left;3 reps;20 seconds                  PT Education - 05/12/20 1520  Education Details Theracane use for self trigger point release    Person(s) Educated Patient    Methods Explanation;Demonstration;Verbal cues    Comprehension Verbalized understanding;Returned demonstration            PT Short Term Goals - 04/16/20 1535      PT SHORT TERM GOAL #1   Title pt will demo consistent retraction of scapula    Baseline discussed at eval    Time 3    Period Weeks    Status New    Target Date 05/07/20      PT SHORT TERM GOAL #2   Title pt will be independent in short term HEP without increase in neck/UE pain    Baseline will progress as appropriate    Time 3    Period Weeks    Status New    Target Date 05/07/20             PT Long Term Goals - 04/16/20 1528      PT LONG TERM GOAL #1   Title pt will be able to return to side sleeping without limitation by pain    Baseline began discussing sleep posture at eval    Time 6    Period Weeks    Status New    Target Date 05/30/20      PT LONG TERM GOAL #2   Title Cervical rotation & sidebend within 5 deg Rt to Lt    Baseline see flowsheet    Time 6    Period Weeks    Status New    Target  Date 05/30/20      PT LONG TERM GOAL #3   Title resolution of Lt distal arm pain    Baseline wrist extensor tightness with s/s consistent with lateral epicondylitis    Time 6    Period Weeks    Status New    Target Date 05/30/20      PT LONG TERM GOAL #4   Title FOTO to 35% limitation    Baseline 44% limitation at eval    Time 6    Period Weeks    Status New    Target Date 05/30/20                 Plan - 05/12/20 1431    Clinical Impression Statement Continued progression of mechanical traction (able to tolerate increase to 20 lbs.) with good tolerance, otherwise continued flexion bias cervical ROM/stretches and manual. Fair progress thus far for pain/radicular symptoms with underlying stenosis.    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, h/o RC injury, h/o wrist injury both on Lt UE    Examination-Activity Limitations Sit;Sleep;Bend;Stand;Carry;Lift;Reach Overhead    Examination-Participation Restrictions Cleaning;Driving;Occupation    Stability/Clinical Decision Making Unstable/Unpredictable    Clinical Decision Making Moderate    Rehab Potential Good    PT Frequency 2x / week    PT Duration 6 weeks    PT Treatment/Interventions ADLs/Self Care Home Management;Cryotherapy;Electrical Stimulation;Moist Heat;Traction;Therapeutic exercise;Therapeutic activities;Functional mobility training;Ultrasound;Neuromuscular re-education;Patient/family education;Manual techniques;Taping;Dry needling;Passive range of motion;Spinal Manipulations;Joint Manipulations    PT Next Visit Plan FOTO staus next session, Continue mechanical traction as tolerated, continue flexion bias ROM, stretches, postural stability, further dry needling prn    PT Home Exercise Plan scap retraction/postural awareness, wrist extensor stretch, cervical retractions, cervical flexion stretch, left upper trapezius and levator  stretch, Theraban rows, extension and supine horizontal abduction    Consulted and  Agree with Plan of Care Patient           Patient will benefit from skilled therapeutic intervention in order to improve the following deficits and impairments:  Decreased activity tolerance, Pain, Impaired UE functional use, Increased muscle spasms, Decreased range of motion, Improper body mechanics, Postural dysfunction  Visit Diagnosis: Cervicalgia  Chronic left shoulder pain  Pain in left arm  Abnormal posture     Problem List Patient Active Problem List   Diagnosis Date Noted  . Cervical disc disorder with radiculopathy of cervical region 03/17/2020  . Greater trochanteric bursitis of both hips 10/23/2019  . Left hand paresthesia 07/02/2013  . ADD (attention deficit disorder) 03/18/2013  . Hematuria 03/18/2013  . History of migraine 10/12/2011  . DEPRESSION 09/23/2009  . GERD 09/23/2009    Beaulah Dinning, PT, DPT 05/12/20 3:51 PM  [place] [facility] [address] [place], [place], [address] Phone: [phone]   Fax:  [phone]  Name: Kelly Gilbert MRN: [id number] Date of Birth: Dec 25, 1960

## 2020-05-13 ENCOUNTER — Encounter: Payer: 59 | Admitting: Physical Therapy

## 2020-05-14 ENCOUNTER — Other Ambulatory Visit: Payer: Self-pay

## 2020-05-14 ENCOUNTER — Ambulatory Visit: Payer: 59 | Admitting: Physical Therapy

## 2020-05-14 ENCOUNTER — Encounter: Payer: Self-pay | Admitting: Physical Therapy

## 2020-05-14 DIAGNOSIS — M542 Cervicalgia: Secondary | ICD-10-CM

## 2020-05-14 DIAGNOSIS — R293 Abnormal posture: Secondary | ICD-10-CM

## 2020-05-14 DIAGNOSIS — M79602 Pain in left arm: Secondary | ICD-10-CM

## 2020-05-14 DIAGNOSIS — G8929 Other chronic pain: Secondary | ICD-10-CM

## 2020-05-14 NOTE — Therapy (Signed)
[place], [place], [address] Phone: [phone]   Fax:  [phone]  Physical Therapy Treatment  Patient Details  Name: Kelly Gilbert MRN: [id number] Date of Birth: 1960/09/08 Referring Provider (PT): Hulan Saas, DO   Encounter Date: 05/14/2020   PT End of Session - 05/14/20 1503    Visit Number 6    Number of Visits 13    Date for PT Re-Evaluation 05/30/20    Authorization Type Aetna, no IONTO    6th visit FOTO    PT Start Time 1412    PT Stop Time 1458    PT Time Calculation (min) 46 min    Activity Tolerance Patient tolerated treatment well    Behavior During Therapy [facility] for tasks assessed/performed           Past Medical History:  Diagnosis Date  . Arthritis   . GERD (gastroesophageal reflux disease)   . Lesion of bladder   . Migraine headache   . PONV (postoperative nausea and vomiting)     Past Surgical History:  Procedure Laterality Date  . BENIGN LEFT BREASET BX  1992  . COLONOSCOPY    . CYSTOSCOPY WITH BIOPSY  01/16/2012   Procedure: CYSTOSCOPY WITH BIOPSY;  Surgeon: Molli Hazard, MD;  Location: [facility];  Service: Urology;  Laterality: N/A;  1 hour requested for this case  C-ARM CAMERA    . VAGINAL HYSTERECTOMY  1999   EPIDURAL ANES.    There were no vitals filed for this visit.   Subjective Assessment - 05/14/20 1411    Subjective No new concerns since last visit. Had pelvic floor PT session yesterday, still with left upper trapezius/upper shoulder region symptoms but not as severe as prior to injection.                             [facility] Adult PT Treatment/Exercise - 05/14/20 0001      Neck Exercises: Seated   Cervical Isometrics Flexion;5 secs;15 reps      Neck Exercises: Supine   Neck Retraction 20 reps      Traction   Type of Traction Cervical    Min (lbs) 12    Max (lbs) 20    Hold Time 60    Rest Time 20     Time 12      Manual Therapy   Manual therapy comments suboccipital release    Joint Mobilization thoarcic PAs grade I-IV    Soft tissue mobilization --    Neural Stretch left median and ulnar nerve glides x 20 ea.      Neck Exercises: Stretches   Upper Trapezius Stretch Left;3 reps;20 seconds    Levator Stretch Left;3 reps;20 seconds            Trigger Point Dry Needling - 05/14/20 0001    Consent Given? Yes    Education Handout Provided Previously provided    Muscles Treated Upper Quadrant Biceps    Muscles Treated Wrist/Hand Extensor carpi radialis longus/brevis                  PT Short Term Goals - 04/16/20 1535      PT SHORT TERM GOAL #1   Title pt will demo consistent retraction of scapula    Baseline discussed at eval    Time 3    Period Weeks    Status New  Target Date 05/07/20      PT SHORT TERM GOAL #2   Title pt will be independent in short term HEP without increase in neck/UE pain    Baseline will progress as appropriate    Time 3    Period Weeks    Status New    Target Date 05/07/20             PT Long Term Goals - 04/16/20 1528      PT LONG TERM GOAL #1   Title pt will be able to return to side sleeping without limitation by pain    Baseline began discussing sleep posture at eval    Time 6    Period Weeks    Status New    Target Date 05/30/20      PT LONG TERM GOAL #2   Title Cervical rotation & sidebend within 5 deg Rt to Lt    Baseline see flowsheet    Time 6    Period Weeks    Status New    Target Date 05/30/20      PT LONG TERM GOAL #3   Title resolution of Lt distal arm pain    Baseline wrist extensor tightness with s/s consistent with lateral epicondylitis    Time 6    Period Weeks    Status New    Target Date 05/30/20      PT LONG TERM GOAL #4   Title FOTO to 35% limitation    Baseline 44% limitation at eval    Time 6    Period Weeks    Status New    Target Date 05/30/20                 Plan -  05/14/20 1503    Clinical Impression Statement Pt. point tender in left ECRB/ECRL and distal biceps region so included brief dry needling this region with differential diagnosis for contributing myofascial pain otherwise continued traction and flexion bias exercises with good tolerance. Progress for therapy goals still ongoing.    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, h/o RC injury, h/o wrist injury both on Lt UE    Examination-Activity Limitations Sit;Sleep;Bend;Stand;Carry;Lift;Reach Overhead    Examination-Participation Restrictions Cleaning;Driving;Occupation    Stability/Clinical Decision Making Evolving/Moderate complexity    Clinical Decision Making Moderate    Rehab Potential Good    PT Frequency 2x / week    PT Duration 6 weeks    PT Treatment/Interventions ADLs/Self Care Home Management;Cryotherapy;Electrical Stimulation;Moist Heat;Traction;Therapeutic exercise;Therapeutic activities;Functional mobility training;Ultrasound;Neuromuscular re-education;Patient/family education;Manual techniques;Taping;Dry needling;Passive range of motion;Spinal Manipulations;Joint Manipulations    PT Next Visit Plan FOTO staus next session, Continue mechanical traction as tolerated, continue flexion bias ROM, stretches, postural stability, further dry needling prn    PT Home Exercise Plan scap retraction/postural awareness, wrist extensor stretch, cervical retractions, cervical flexion stretch, left upper trapezius and levator stretch, Theraban rows, extension and supine horizontal abduction    Consulted and Agree with Plan of Care Patient           Patient will benefit from skilled therapeutic intervention in order to improve the following deficits and impairments:  Decreased activity tolerance, Pain, Impaired UE functional use, Increased muscle spasms, Decreased range of motion, Improper body mechanics, Postural  dysfunction  Visit Diagnosis: Cervicalgia  Chronic left shoulder pain  Pain in left arm  Abnormal posture     Problem List Patient Active Problem List   Diagnosis  Date Noted  . Cervical disc disorder with radiculopathy of cervical region 03/17/2020  . Greater trochanteric bursitis of both hips 10/23/2019  . Left hand paresthesia 07/02/2013  . ADD (attention deficit disorder) 03/18/2013  . Hematuria 03/18/2013  . History of migraine 10/12/2011  . DEPRESSION 09/23/2009  . GERD 09/23/2009    Beaulah Dinning, PT, DPT 05/14/20 3:28 PM  [place] [facility] [address] [place], [place], [address] Phone: [phone]   Fax:  [phone]  Name: Kelly Gilbert MRN: [id number] Date of Birth: August 02, 1961

## 2020-05-16 ENCOUNTER — Other Ambulatory Visit: Payer: Self-pay | Admitting: Family Medicine

## 2020-05-18 ENCOUNTER — Encounter: Payer: 59 | Admitting: Physical Therapy

## 2020-05-19 ENCOUNTER — Other Ambulatory Visit: Payer: Self-pay

## 2020-05-19 ENCOUNTER — Ambulatory Visit: Payer: 59 | Admitting: Physical Therapy

## 2020-05-19 ENCOUNTER — Encounter: Payer: Self-pay | Admitting: Physical Therapy

## 2020-05-19 VITALS — BP 148/99 | HR 87

## 2020-05-19 DIAGNOSIS — R293 Abnormal posture: Secondary | ICD-10-CM

## 2020-05-19 DIAGNOSIS — M25512 Pain in left shoulder: Secondary | ICD-10-CM

## 2020-05-19 DIAGNOSIS — M542 Cervicalgia: Secondary | ICD-10-CM

## 2020-05-19 DIAGNOSIS — M79602 Pain in left arm: Secondary | ICD-10-CM

## 2020-05-19 DIAGNOSIS — G8929 Other chronic pain: Secondary | ICD-10-CM

## 2020-05-19 NOTE — Therapy (Signed)
[place], [place], [address] Phone: [phone]   Fax:  [phone]  Physical Therapy Treatment  Patient Details  Name: Kelly Gilbert MRN: [id number] Date of Birth: 1960-11-06 Referring Provider (PT): Hulan Saas, DO   Encounter Date: 05/19/2020   PT End of Session - 05/19/20 1426    Visit Number 7    Number of Visits 13    Date for PT Re-Evaluation 05/30/20    Authorization Type Aetna, no IONTO    6th visit FOTO    PT Start Time 4166    PT Stop Time 1502    PT Time Calculation (min) 45 min    Activity Tolerance Patient tolerated treatment well    Behavior During Therapy [facility] for tasks assessed/performed           Past Medical History:  Diagnosis Date  . Arthritis   . GERD (gastroesophageal reflux disease)   . Lesion of bladder   . Migraine headache   . PONV (postoperative nausea and vomiting)     Past Surgical History:  Procedure Laterality Date  . BENIGN LEFT BREASET BX  1992  . COLONOSCOPY    . CYSTOSCOPY WITH BIOPSY  01/16/2012   Procedure: CYSTOSCOPY WITH BIOPSY;  Surgeon: Molli Hazard, MD;  Location: [facility];  Service: Urology;  Laterality: N/A;  1 hour requested for this case  C-ARM CAMERA    . VAGINAL HYSTERECTOMY  1999   EPIDURAL ANES.    Vitals:   05/19/20 1427  BP: (!) 148/99  Pulse: 87  SpO2: 99%     Subjective Assessment - 05/19/20 1427    Subjective [place]. [place] and reports had onset of symptoms with dizziness/vertigo-she reports symptoms of "spinning" associated with positional changes/getting up with vertigo lasting approximately 1 minute. She got a Covid test which came back (-). Left arm symptoms about the same.              [facility] PT Assessment - 05/19/20 0001      Observation/Other Assessments   Focus on Therapeutic Outcomes (FOTO)  42% limited                          [facility] Adult PT Treatment/Exercise - 05/19/20 0001      Neck Exercises: Theraband   Shoulder Extension 20 reps;Green    Rows 20 reps;Blue    Horizontal ABduction 20 reps;Green    Horizontal ABduction Limitations with diagonal "chops"      Neck Exercises: Seated   Cervical Isometrics Flexion;15 reps      Neck Exercises: Supine   Other Supine Exercise serratus punch 3 lbs. bilat. 2x10      Traction   Type of Traction Cervical    Min (lbs) 12    Max (lbs) 20    Hold Time 60    Rest Time 20    Time 12      Manual Therapy   Neural Stretch left median and ulnar nerve glides + flossed x 15 ea.      Neck Exercises: Stretches   Upper Trapezius Stretch Left;3 reps;30 seconds                    PT Short Term Goals - 04/16/20 1535      PT SHORT TERM GOAL #1   Title pt will demo consistent retraction of scapula  Baseline discussed at eval    Time 3    Period Weeks    Status New    Target Date 05/07/20      PT SHORT TERM GOAL #2   Title pt will be independent in short term HEP without increase in neck/UE pain    Baseline will progress as appropriate    Time 3    Period Weeks    Status New    Target Date 05/07/20             PT Long Term Goals - 05/19/20 1447      PT LONG TERM GOAL #1   Title pt will be able to return to side sleeping without limitation by pain    Time 6    Period Weeks    Status On-going      PT LONG TERM GOAL #2   Title Cervical rotation & sidebend within 5 deg Rt to Lt    Baseline ongoing    Time 6    Period Weeks    Status On-going      PT LONG TERM GOAL #3   Title resolution of Lt distal arm pain    Baseline ongoing    Time 6    Period Weeks    Status On-going      PT LONG TERM GOAL #4   Title FOTO to 35% limitation    Baseline 42% limited    Time 6    Period Weeks    Status On-going                 Plan - 05/19/20 1432    Clinical Impression Statement Checked vitals as noted  with BP noted to be elevated but within acceptable limits to proceed with tx. Symptoms of brief vertigo associated with positional changes could be concerning for BPPV so advised MD follow up if symptoms persist. No issues with vertigi symptoms during tx. session today.    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, h/o RC injury, h/o wrist injury both on Lt UE    Examination-Activity Limitations Sit;Sleep;Bend;Stand;Carry;Lift;Reach Overhead    Examination-Participation Restrictions Cleaning;Driving;Occupation    Stability/Clinical Decision Making Evolving/Moderate complexity    Clinical Decision Making Moderate    Rehab Potential Good    PT Frequency 2x / week    PT Duration 6 weeks    PT Treatment/Interventions ADLs/Self Care Home Management;Cryotherapy;Electrical Stimulation;Moist Heat;Traction;Therapeutic exercise;Therapeutic activities;Functional mobility training;Ultrasound;Neuromuscular re-education;Patient/family education;Manual techniques;Taping;Dry needling;Passive range of motion;Spinal Manipulations;Joint Manipulations    PT Next Visit Plan Monitor any further vertigo symptoms/check vitals prn, Continue mechanical traction as tolerated, continue flexion bias ROM, stretches, postural stability, further dry needling prn    PT Home Exercise Plan scap retraction/postural awareness, wrist extensor stretch, cervical retractions, cervical flexion stretch, left upper trapezius and levator stretch, Theraban rows, extension and supine horizontal abduction    Consulted and Agree with Plan of Care Patient           Patient will benefit from skilled therapeutic intervention in order to improve the following deficits and impairments:  Decreased activity tolerance, Pain, Impaired UE functional use, Increased muscle spasms, Decreased range of motion, Improper body mechanics, Postural dysfunction  Visit  Diagnosis: Cervicalgia  Chronic left shoulder pain  Pain in left arm  Abnormal posture     Problem List Patient Active Problem List   Diagnosis Date Noted  . Cervical disc disorder with radiculopathy of cervical region  03/17/2020  . Greater trochanteric bursitis of both hips 10/23/2019  . Left hand paresthesia 07/02/2013  . ADD (attention deficit disorder) 03/18/2013  . Hematuria 03/18/2013  . History of migraine 10/12/2011  . DEPRESSION 09/23/2009  . GERD 09/23/2009    Beaulah Dinning, PT, DPT 05/19/20 3:04 PM  [facility] [address] [place], [place], [address] Phone: [phone]   Fax:  [phone]  Name: FALESHA SCHOMMER MRN: [id number] Date of Birth: Dec 30, 1960

## 2020-05-20 ENCOUNTER — Encounter: Payer: 59 | Admitting: Physical Therapy

## 2020-05-22 ENCOUNTER — Ambulatory Visit: Payer: 59 | Admitting: Physical Therapy

## 2020-05-22 ENCOUNTER — Other Ambulatory Visit: Payer: Self-pay

## 2020-05-22 ENCOUNTER — Encounter: Payer: Self-pay | Admitting: Physical Therapy

## 2020-05-22 DIAGNOSIS — M542 Cervicalgia: Secondary | ICD-10-CM

## 2020-05-22 DIAGNOSIS — G8929 Other chronic pain: Secondary | ICD-10-CM

## 2020-05-22 DIAGNOSIS — M25512 Pain in left shoulder: Secondary | ICD-10-CM

## 2020-05-22 DIAGNOSIS — R293 Abnormal posture: Secondary | ICD-10-CM

## 2020-05-22 DIAGNOSIS — M79602 Pain in left arm: Secondary | ICD-10-CM

## 2020-05-22 NOTE — Therapy (Signed)
[place], [place], [address] Phone: [phone]   Fax:  [phone]  Physical Therapy Treatment  Patient Details  Name: Kelly Gilbert MRN: [id number] Date of Birth: 07/18/1961 Referring Provider (PT): Hulan Saas, DO   Encounter Date: 05/22/2020   PT End of Session - 05/22/20 1322    Visit Number 8    Number of Visits 13    Date for PT Re-Evaluation 05/30/20    Authorization Type [employer], no IONTO    6th visit FOTO    PT Start Time 1228    PT Stop Time 1315    PT Time Calculation (min) 47 min    Activity Tolerance Patient tolerated treatment well    Behavior During Therapy [facility] for tasks assessed/performed           Past Medical History:  Diagnosis Date  . Arthritis   . GERD (gastroesophageal reflux disease)   . Lesion of bladder   . Migraine headache   . PONV (postoperative nausea and vomiting)     Past Surgical History:  Procedure Laterality Date  . BENIGN LEFT BREASET BX  1992  . COLONOSCOPY    . CYSTOSCOPY WITH BIOPSY  01/16/2012   Procedure: CYSTOSCOPY WITH BIOPSY;  Surgeon: Molli Hazard, MD;  Location: [facility];  Service: Urology;  Laterality: N/A;  1 hour requested for this case  C-ARM CAMERA    . VAGINAL HYSTERECTOMY  1999   EPIDURAL ANES.    There were no vitals filed for this visit.   Subjective Assessment - 05/22/20 1243    Subjective Pt. reports she tried self-Epley manuever at home for vertigo symptoms earlier today-not sure yet if it helped. Left arm feeling better today.                             [facility] PT Treatment/Exercise - 05/22/20 0001      Neck Exercises: Supine   Neck Retraction 20 reps    Neck Retraction Limitations with gentle therapist overpressure    Capital Flexion Limitations supine retraction with small amplitude assisted flexion nod x 20 reps, supine retraction to assisted lower cervical range  flexion x 15 reps      Traction   Type of Traction Cervical    Min (lbs) 12    Max (lbs) 20    Hold Time 60    Rest Time 20    Time 15      Manual Therapy   Manual Therapy Myofascial release    Myofascial Release suboccipital release    Neural Stretch left median and ulnar nerve glides + floss x 20 reps ea.      Neck Exercises: Stretches   Upper Trapezius Stretch Left;3 reps;30 seconds    Levator Stretch Left;3 reps;30 seconds    Chest Stretch 2 reps;30 seconds   supine manual pec minor stretch                 PT Education - 05/22/20 1322    Education Details POC    Person(s) Educated Patient    Methods Explanation    Comprehension Verbalized understanding            PT Short Term Goals - 04/16/20 1535      PT SHORT TERM GOAL #1   Title pt will demo consistent retraction of scapula    Baseline discussed at eval    Time  3    Period Weeks    Status New    Target Date 05/07/20      PT SHORT TERM GOAL #2   Title pt will be independent in short term HEP without increase in neck/UE pain    Baseline will progress as appropriate    Time 3    Period Weeks    Status New    Target Date 05/07/20             PT Long Term Goals - 05/19/20 1447      PT LONG TERM GOAL #1   Title pt will be able to return to side sleeping without limitation by pain    Time 6    Period Weeks    Status On-going      PT LONG TERM GOAL #2   Title Cervical rotation & sidebend within 5 deg Rt to Lt    Baseline ongoing    Time 6    Period Weeks    Status On-going      PT LONG TERM GOAL #3   Title resolution of Lt distal arm pain    Baseline ongoing    Time 6    Period Weeks    Status On-going      PT LONG TERM GOAL #4   Title FOTO to 35% limitation    Baseline 42% limited    Time 6    Period Weeks    Status On-going                 Plan - 05/22/20 1245    Clinical Impression Statement Improved response for decreased left UE radicular symptoms. Continued  previous tx. emphasis with traction, nerve glides and flexion bias exercises for underlying stenosis with good tolerance. Brief provocation of vertigo symptoms with supine>sit at end of session resolved with brief rest-will continue to monitor and will recommend MD follow up for this with any ongoing/persistent symptoms.    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, h/o RC injury, h/o wrist injury both on Lt UE    Examination-Activity Limitations Sit;Sleep;Bend;Stand;Carry;Lift;Reach Overhead    Examination-Participation Restrictions Cleaning;Driving;Occupation    Stability/Clinical Decision Making Evolving/Moderate complexity    Clinical Decision Making Moderate    Rehab Potential Good    PT Frequency 2x / week    PT Duration 6 weeks    PT Treatment/Interventions ADLs/Self Care Home Management;Cryotherapy;Electrical Stimulation;Moist Heat;Traction;Therapeutic exercise;Therapeutic activities;Functional mobility training;Ultrasound;Neuromuscular re-education;Patient/family education;Manual techniques;Taping;Dry needling;Passive range of motion;Spinal Manipulations;Joint Manipulations    PT Next Visit Plan Monitor any further vertigo symptoms/check vitals prn, Continue mechanical traction as tolerated, continue flexion bias ROM, stretches, postural stability, further dry needling prn    PT Home Exercise Plan scap retraction/postural awareness, wrist extensor stretch, cervical retractions, cervical flexion stretch, left upper trapezius and levator stretch, Theraban rows, extension and supine horizontal abduction    Consulted and Agree with Plan of Care Patient           Patient will benefit from skilled therapeutic intervention in order to improve the following deficits and impairments:  Decreased activity tolerance, Pain, Impaired UE functional use, Increased muscle spasms, Decreased range of motion, Improper  body mechanics, Postural dysfunction  Visit Diagnosis: Cervicalgia  Chronic left shoulder pain  Pain in left arm  Abnormal posture     Problem List Patient Active Problem List   Diagnosis Date Noted  . Cervical disc disorder with radiculopathy of cervical region  03/17/2020  . Greater trochanteric bursitis of both hips 10/23/2019  . Left hand paresthesia 07/02/2013  . ADD (attention deficit disorder) 03/18/2013  . Hematuria 03/18/2013  . History of migraine 10/12/2011  . DEPRESSION 09/23/2009  . GERD 09/23/2009    Beaulah Dinning, PT, DPT 05/22/20 1:25 PM  [facility] [address] [place], [place], [address] Phone: [phone]   Fax:  [phone]  Name: Kelly Gilbert MRN: [id number] Date of Birth: 27-Sep-1960

## 2020-05-27 ENCOUNTER — Encounter: Payer: Self-pay | Admitting: Family Medicine

## 2020-05-27 ENCOUNTER — Other Ambulatory Visit: Payer: Self-pay

## 2020-05-27 ENCOUNTER — Ambulatory Visit (INDEPENDENT_AMBULATORY_CARE_PROVIDER_SITE_OTHER): Payer: 59 | Admitting: Family Medicine

## 2020-05-27 VITALS — Ht 66.0 in | Wt 171.9 lb

## 2020-05-27 DIAGNOSIS — Z23 Encounter for immunization: Secondary | ICD-10-CM

## 2020-05-27 DIAGNOSIS — R0600 Dyspnea, unspecified: Secondary | ICD-10-CM | POA: Diagnosis not present

## 2020-05-27 DIAGNOSIS — H8111 Benign paroxysmal vertigo, right ear: Secondary | ICD-10-CM

## 2020-05-27 DIAGNOSIS — R0609 Other forms of dyspnea: Secondary | ICD-10-CM

## 2020-05-27 MED ORDER — MECLIZINE HCL 25 MG PO TABS: 25.0000 mg | ORAL_TABLET | Freq: Three times a day (TID) | ORAL | 0 refills | Status: DC | PRN

## 2020-05-27 NOTE — Progress Notes (Signed)
Established Patient Office Visit  Subjective:  Patient ID: Kelly Gilbert, female    DOB: [birth year]  Age: 59 y.o. MRN: [id number]  CC:  Chief Complaint  Patient presents with  . Dizziness    having dizziness started last saturday  , nausea,  headache, having coughing up mucus     HPI Kelly Gilbert presents for increased dizziness since Saturday.  [place] and recalls bending over to pick up something and when she stood back up she had severe vertigo followed by some nausea and vomiting.  She noted when rolling over in the bed to the right side this seemed to trigger her vertigo.  Her symptoms are slightly improved at this time but still persists when she rolls over to the right side.  She had similar episode about 4 years ago.  Denies any recent hearing loss, tinnitus, headaches, focal weakness, confusion, visual changes, ataxia, speech changes, or any swallowing difficulties.  Chronic problems include history of migraine headaches, osteoarthritis multiple joints, GERD, and history of ADD.  Ongoing nicotine use.  She started smoking about 40 years ago generally about 1/2 pack/day.  She is interested in trying to quit.  She relates some exertional dyspnea which has been progressive over several years.  She knows she needs to lose some weight and states she has lost about 17 pounds due to her efforts in recent weeks.  Hopes to lose more.  She denies any recent chest pains.  She had exercise stress test and echo stress test back in January 2020 which were unremarkable.  She states she had very difficult time doing the test at that point because of dyspnea and fatigue.  She knows a good amount of this may be deconditioning but wonders if there may be some COPD.  She states she has chronic early morning cough of "brown" sputum.  No hemoptysis.  She had chest x-ray 09/24/2019 which showed no acute findings  Past Medical History:  Diagnosis Date  . Arthritis     . GERD (gastroesophageal reflux disease)   . Lesion of bladder   . Migraine headache   . PONV (postoperative nausea and vomiting)     Past Surgical History:  Procedure Laterality Date  . BENIGN LEFT BREASET BX  1992  . COLONOSCOPY    . CYSTOSCOPY WITH BIOPSY  01/16/2012   Procedure: CYSTOSCOPY WITH BIOPSY;  Surgeon: Molli Hazard, MD;  Location: [facility];  Service: Urology;  Laterality: N/A;  1 hour requested for this case  C-ARM CAMERA    . VAGINAL HYSTERECTOMY  1999   EPIDURAL ANES.    Family History  Problem Relation Age of Onset  . Hypertension Father   . Cancer Father        prostate  . Hypertension Maternal Grandmother   . Cancer Mother 90       renal cell cancer  . Colon cancer Neg Hx   . Pancreatic cancer Neg Hx   . Stomach cancer Neg Hx   . Esophageal cancer Neg Hx     Social History   Socioeconomic History  . Marital status: Married    Spouse name: Not on file  . Number of children: 2  . Years of education: Not on file  . Highest education level: Not on file  Occupational History    Comment: Realtor  Tobacco Use  . Smoking status: Current Every Day Smoker    Packs/day: 0.25  Years: 35.00    Pack years: 8.75    Types: Cigarettes  . Smokeless tobacco: Never Used  . Tobacco comment: QUIT SMOKING 2008--  RECENTLY STARTED BACK 7-8 CIG. PER DAY  Vaping Use  . Vaping Use: Never used  Substance and Sexual Activity  . Alcohol use: Yes    Alcohol/week: 2.0 standard drinks    Types: 2 Shots of liquor per week    Comment: Occasional  . Drug use: Never  . Sexual activity: Not on file  Other Topics Concern  . Not on file  Social History Narrative  . Not on file   Social Determinants of Health   Financial Resource Strain:   . Difficulty of Paying Living Expenses: Not on file  Food Insecurity:   . Worried About Charity fundraiser in the Last Year: Not on file  . Ran Out of Food in the Last Year: Not on file   Transportation Needs:   . Lack of Transportation (Medical): Not on file  . Lack of Transportation (Non-Medical): Not on file  Physical Activity:   . Days of Exercise per Week: Not on file  . Minutes of Exercise per Session: Not on file  Stress:   . Feeling of Stress : Not on file  Social Connections:   . Frequency of Communication with Friends and Family: Not on file  . Frequency of Social Gatherings with Friends and Family: Not on file  . Attends Religious Services: Not on file  . Active Member of Clubs or Organizations: Not on file  . Attends Archivist Meetings: Not on file  . Marital Status: Not on file  Intimate Partner Violence:   . Fear of Current or Ex-Partner: Not on file  . Emotionally Abused: Not on file  . Physically Abused: Not on file  . Sexually Abused: Not on file    Outpatient Medications Prior to Visit  Medication Sig Dispense Refill  . amphetamine-dextroamphetamine (ADDERALL) 15 MG tablet Take 1 tablet by mouth 2 (two) times daily. May refill in one month 60 tablet 0  . Aspirin-Acetaminophen-Caffeine (EXCEDRIN PO) Take 1 tablet by mouth daily as needed (headache).     . Biotin 5000 MCG CAPS Take 1 capsule by mouth every evening.     . Calcium Carb-Cholecalciferol (CALCIUM 600 + D PO) Take by mouth daily.    . cholecalciferol (VITAMIN D3) 25 MCG (1000 UNIT) tablet Take 1,000 Units by mouth daily.    Marland Kitchen gabapentin (NEURONTIN) 100 MG capsule Take 2 capsules (200 mg total) by mouth at bedtime. 180 capsule 0  . imipramine (TOFRANIL) 25 MG tablet Take 1 tablet (25 mg total) by mouth at bedtime. 90 tablet 3  . magnesium 30 MG tablet Take 30 mg by mouth daily.    . NON FORMULARY Doterra- alpha CRS and Cellular Vitality complex- 2 daily    . omeprazole (PRILOSEC) 20 MG capsule Take 20 mg by mouth daily.    Marland Kitchen triamcinolone cream (KENALOG) 0.1 % Apply 1 application topically 2 (two) times daily. For 1-2 weeks. (Patient taking differently: Apply 1 application  topically 2 (two) times daily as needed (rash). ) 45 g 0  . TURMERIC PO Take 2 capsules by mouth.    . vitamin C (ASCORBIC ACID) 250 MG tablet Take 500 mg by mouth daily.    Marland Kitchen L-LYSINE PO Take 1,000 mg by mouth daily.    . meloxicam (MOBIC) 15 MG tablet Take 1 tablet (15 mg total) by mouth daily. (Patient  not taking: Reported on 05/27/2020) 30 tablet 0  . Multiple Vitamin (MULTIVITAMIN PO) Take 1 capsule by mouth daily. (Patient not taking: Reported on 05/27/2020)    . NON FORMULARY Doterra Micro Plex VMz Food Nutrient complex- 2 daily    . NON FORMULARY Doterra- EO Mega essential oil omega complex-  2 daily (Patient not taking: Reported on 05/27/2020)    . PENNSAID 2 % SOLN APPLY TWO PUMPS (40MG ) TOPICALLY TO AFFECTED AREA(S) TWICE A DAY 112 g 2  . gabapentin (NEURONTIN) 100 MG capsule Take 2 capsules (200 mg total) by mouth at bedtime. 180 capsule 0   Facility-Administered Medications Prior to Visit  Medication Dose Route Frequency Provider Last Rate Last Admin  . 0.9 %  sodium chloride infusion  500 mL Intravenous Once Pyrtle, Lajuan Lines, MD        Allergies  Allergen Reactions  . Codeine Sulfate Nausea And Vomiting  . Sulfa Antibiotics Hives and Swelling    ROS Review of Systems  Constitutional: Negative for chills and fever.  Respiratory: Positive for cough and shortness of breath. Negative for wheezing.   Cardiovascular: Negative for chest pain and leg swelling.  Gastrointestinal: Negative for abdominal pain.  Neurological: Positive for dizziness. Negative for seizures, syncope, speech difficulty, weakness and headaches.  Hematological: Negative for adenopathy.  Psychiatric/Behavioral: Negative for confusion.      Objective:    Physical Exam Vitals reviewed.  Constitutional:      Appearance: Normal appearance.  HENT:     Head: Normocephalic and atraumatic.  Eyes:     Extraocular Movements: Extraocular movements intact.     Pupils: Pupils are equal, round, and reactive to  light.  Cardiovascular:     Rate and Rhythm: Normal rate and regular rhythm.  Pulmonary:     Effort: Pulmonary effort is normal.     Breath sounds: Normal breath sounds.  Musculoskeletal:     Cervical back: Neck supple.  Neurological:     General: No focal deficit present.     Mental Status: She is alert and oriented to person, place, and time.     Cranial Nerves: No cranial nerve deficit.     Motor: No weakness.     Coordination: Coordination normal.     Gait: Gait normal.     Comments: She has vertigo symptoms which are triggered when turning head 45 degrees to the right and laying supine.  This was accompanied by some horizontal nystagmus.  Symptoms resolved after several seconds     Ht 5\' 6"  (1.676 m)   Wt 171 lb 14.4 oz (78 kg)   SpO2 96%   BMI 27.75 kg/m  Wt Readings from Last 3 Encounters:  05/27/20 171 lb 14.4 oz (78 kg)  04/21/20 176 lb (79.8 kg)  03/17/20 187 lb (84.8 kg)     Health Maintenance Due  Topic Date Due  . Hepatitis C Screening  Never done  . COVID-19 Vaccine (1) Never done  . HIV Screening  Never done  . MAMMOGRAM  05/13/2016  . INFLUENZA VACCINE  03/08/2020    There are no preventive care reminders to display for this patient.  Lab Results  Component Value Date   TSH 0.93 12/18/2015   Lab Results  Component Value Date   WBC 7.1 09/24/2019   HGB 12.6 09/24/2019   HCT 39.3 09/24/2019   MCV 90.6 09/24/2019   PLT 343 09/24/2019   Lab Results  Component Value Date   NA 142 09/24/2019   K  3.9 09/24/2019   CO2 28 09/24/2019   GLUCOSE 93 09/24/2019   BUN 15 09/24/2019   CREATININE 0.79 09/24/2019   BILITOT 0.2 12/18/2015   ALKPHOS 60 12/18/2015   AST 12 12/18/2015   ALT 11 12/18/2015   PROT 6.4 12/18/2015   ALBUMIN 4.2 12/18/2015   CALCIUM 9.4 09/24/2019   ANIONGAP 8 09/24/2019   GFR 94.52 12/18/2015   Lab Results  Component Value Date   CHOL 206 (H) 12/18/2015   Lab Results  Component Value Date   HDL 62.30 12/18/2015    Lab Results  Component Value Date   LDLCALC 125 (H) 12/18/2015   Lab Results  Component Value Date   TRIG 94.0 12/18/2015   Lab Results  Component Value Date   CHOLHDL 3 12/18/2015   No results found for: HGBA1C    Assessment & Plan:   #1 vertigo.  Very likely has benign peripheral positional vertigo given her history.  She does not have any worrisome exam findings or elements from history to suggest more worrisome vertigo.  -Handout for Epley maneuvers given -Limited Antivert 25 mg 1 every 6-8 hours as needed for nausea -Consider vestibular rehab if symptoms not resolving over the next week  #2 exertional dyspnea.  May be combination of deconditioning and chronic lung disease.  -Continue weight loss efforts -Strongly encouraged to discontinue smoking completely -Set up pulmonary referral to consider pulmonary functions and further evaluation of her dyspnea -She had prior cardiac work-up 2020 which was unrevealing -Flu vaccine given  Meds ordered this encounter  Medications  . meclizine (ANTIVERT) 25 MG tablet    Sig: Take 1 tablet (25 mg total) by mouth 3 (three) times daily as needed for dizziness.    Dispense:  30 tablet    Refill:  0    Follow-up: No follow-ups on file.    Carolann Littler, MD

## 2020-05-27 NOTE — Patient Instructions (Signed)
Benign Positional Vertigo Vertigo is the feeling that you or your surroundings are moving when they are not. Benign positional vertigo is the most common form of vertigo. This is usually a harmless condition (benign). This condition is positional. This means that symptoms are triggered by certain movements and positions. This condition can be dangerous if it occurs while you are doing something that could cause harm to you or others. This includes activities such as driving or operating machinery. What are the causes? In many cases, the cause of this condition is not known. It may be caused by a disturbance in an area of the inner ear that helps your brain to sense movement and balance. This disturbance can be caused by:  Viral infection (labyrinthitis).  Head injury.  Repetitive motion, such as jumping, dancing, or running. What increases the risk? You are more likely to develop this condition if:  You are a woman.  You are 50 years of age or older. What are the signs or symptoms? Symptoms of this condition usually happen when you move your head or your eyes in different directions. Symptoms may start suddenly, and usually last for less than a minute. They include:  Loss of balance and falling.  Feeling like you are spinning or moving.  Feeling like your surroundings are spinning or moving.  Nausea and vomiting.  Blurred vision.  Dizziness.  Involuntary eye movement (nystagmus). Symptoms can be mild and cause only minor problems, or they can be severe and interfere with daily life. Episodes of benign positional vertigo may return (recur) over time. Symptoms may improve over time. How is this diagnosed? This condition may be diagnosed based on:  Your medical history.  Physical exam of the head, neck, and ears.  Tests, such as: ? MRI. ? CT scan. ? Eye movement tests. Your health care provider may ask you to change positions quickly while he or she watches you for symptoms  of benign positional vertigo, such as nystagmus. Eye movement may be tested with a variety of exams that are designed to evaluate or stimulate vertigo. ? An electroencephalogram (EEG). This records electrical activity in your brain. ? Hearing tests. You may be referred to a health care provider who specializes in ear, nose, and throat (ENT) problems (otolaryngologist) or a provider who specializes in disorders of the nervous system (neurologist). How is this treated?  This condition may be treated in a session in which your health care provider moves your head in specific positions to adjust your inner ear back to normal. Treatment for this condition may take several sessions. Surgery may be needed in severe cases, but this is rare. In some cases, benign positional vertigo may resolve on its own in 2-4 weeks. Follow these instructions at home: Safety  Move slowly. Avoid sudden body or head movements or certain positions, as told by your health care provider.  Avoid driving until your health care provider says it is safe for you to do so.  Avoid operating heavy machinery until your health care provider says it is safe for you to do so.  Avoid doing any tasks that would be dangerous to you or others if vertigo occurs.  If you have trouble walking or keeping your balance, try using a cane for stability. If you feel dizzy or unstable, sit down right away.  Return to your normal activities as told by your health care provider. Ask your health care provider what activities are safe for you. General instructions  Take over-the-counter   and prescription medicines only as told by your health care provider.  Drink enough fluid to keep your urine pale yellow.  Keep all follow-up visits as told by your health care provider. This is important. Contact a health care provider if:  You have a fever.  Your condition gets worse or you develop new symptoms.  Your family or friends notice any  behavioral changes.  You have nausea or vomiting that gets worse.  You have numbness or a "pins and needles" sensation. Get help right away if you:  Have difficulty speaking or moving.  Are always dizzy.  Faint.  Develop severe headaches.  Have weakness in your legs or arms.  Have changes in your hearing or vision.  Develop a stiff neck.  Develop sensitivity to light. Summary  Vertigo is the feeling that you or your surroundings are moving when they are not. Benign positional vertigo is the most common form of vertigo.  The cause of this condition is not known. It may be caused by a disturbance in an area of the inner ear that helps your brain to sense movement and balance.  Symptoms include loss of balance and falling, feeling that you or your surroundings are moving, nausea and vomiting, and blurred vision.  This condition can be diagnosed based on symptoms, physical exam, and other tests, such as MRI, CT scan, eye movement tests, and hearing tests.  Follow safety instructions as told by your health care provider. You will also be told when to contact your health care provider in case of problems. This information is not intended to replace advice given to you by your health care provider. Make sure you discuss any questions you have with your health care provider. Document Revised: 01/03/2018 Document Reviewed: 01/03/2018 Elsevier Patient Education  2020 [employer]  

## 2020-06-01 NOTE — Progress Notes (Signed)
[place] [address] [place] Phone: [phone] Subjective:   I Kelly Gilbert am serving as a Education administrator for Dr. Hulan Saas.  This visit occurred during the SARS-CoV-2 public health emergency.  Safety protocols were in place, including screening questions prior to the visit, additional usage of staff PPE, and extensive cleaning of exam room while observing appropriate contact time as indicated for disinfecting solutions.   I'm seeing this patient by the request  of:  Eulas Post, MD  CC: neck pain follow up   HQI:[id number]   04/21/2020 Do not need to discuss too much seems to be doing well.  Patient has started physical therapy for pelvic floor.  Patient responded very well to epidural at this time and has started with physical therapy.  I do believe patient will do well with conservative therapy.  Encourage the possible continuation of the gabapentin.  We can consider the possibility of repeating epidurals if necessary.  Follow-up with me again in 6 weeks  Update 06/02/2020 Kelly Gilbert is a 59 y.o. female coming in with complaint of hip and neck pain. Patient states her neck is doing well. Some issues here and there. The hips have been bothering her bilaterally. [place] and states it was painful. Believes she is having issues with bursitis. Going to the [facility] in [place] in November and would like injections.        Past Medical History:  Diagnosis Date  . Arthritis   . GERD (gastroesophageal reflux disease)   . Lesion of bladder   . Migraine headache   . PONV (postoperative nausea and vomiting)    Past Surgical History:  Procedure Laterality Date  . BENIGN LEFT BREASET BX  1992  . COLONOSCOPY    . CYSTOSCOPY WITH BIOPSY  01/16/2012   Procedure: CYSTOSCOPY WITH BIOPSY;  Surgeon: Molli Hazard, MD;  Location: [facility];  Service: Urology;  Laterality: N/A;  1 hour requested  for this case  C-ARM CAMERA    . VAGINAL HYSTERECTOMY  1999   EPIDURAL ANES.   Social History   Socioeconomic History  . Marital status: Married    Spouse name: Not on file  . Number of children: 2  . Years of education: Not on file  . Highest education level: Not on file  Occupational History    Comment: Realtor  Tobacco Use  . Smoking status: Current Every Day Smoker    Packs/day: 0.25    Years: 35.00    Pack years: 8.75    Types: Cigarettes  . Smokeless tobacco: Never Used  . Tobacco comment: QUIT SMOKING 2008--  RECENTLY STARTED BACK 7-8 CIG. PER DAY  Vaping Use  . Vaping Use: Never used  Substance and Sexual Activity  . Alcohol use: Yes    Alcohol/week: 2.0 standard drinks    Types: 2 Shots of liquor per week    Comment: Occasional  . Drug use: Never  . Sexual activity: Not on file  Other Topics Concern  . Not on file  Social History Narrative  . Not on file   Social Determinants of Health   Financial Resource Strain:   . Difficulty of Paying Living Expenses: Not on file  Food Insecurity:   . Worried About Charity fundraiser in the Last Year: Not on file  . Ran Out of Food in the Last Year: Not on file  Transportation Needs:   .  Lack of Transportation (Medical): Not on file  . Lack of Transportation (Non-Medical): Not on file  Physical Activity:   . Days of Exercise per Week: Not on file  . Minutes of Exercise per Session: Not on file  Stress:   . Feeling of Stress : Not on file  Social Connections:   . Frequency of Communication with Friends and Family: Not on file  . Frequency of Social Gatherings with Friends and Family: Not on file  . Attends Religious Services: Not on file  . Active Member of Clubs or Organizations: Not on file  . Attends Archivist Meetings: Not on file  . Marital Status: Not on file   Allergies  Allergen Reactions  . Codeine Sulfate Nausea And Vomiting  . Sulfa Antibiotics Hives and Swelling   Family History    Problem Relation Age of Onset  . Hypertension Father   . Cancer Father        prostate  . Hypertension Maternal Grandmother   . Cancer Mother 72       renal cell cancer  . Colon cancer Neg Hx   . Pancreatic cancer Neg Hx   . Stomach cancer Neg Hx   . Esophageal cancer Neg Hx           Current Outpatient Medications (Analgesics):  Marland Kitchen  Aspirin-Acetaminophen-Caffeine (EXCEDRIN PO), Take 1 tablet by mouth daily as needed (headache).  .  meloxicam (MOBIC) 15 MG tablet, Take 1 tablet (15 mg total) by mouth daily.     Current Outpatient Medications (Other):  .  amphetamine-dextroamphetamine (ADDERALL) 15 MG tablet, Take 1 tablet by mouth 2 (two) times daily. May refill in one month .  Biotin 5000 MCG CAPS, Take 1 capsule by mouth every evening.  .  Calcium Carb-Cholecalciferol (CALCIUM 600 + D PO), Take by mouth daily. .  cholecalciferol (VITAMIN D3) 25 MCG (1000 UNIT) tablet, Take 1,000 Units by mouth daily. Marland Kitchen  gabapentin (NEURONTIN) 100 MG capsule, Take 2 capsules (200 mg total) by mouth at bedtime. Marland Kitchen  imipramine (TOFRANIL) 25 MG tablet, Take 1 tablet (25 mg total) by mouth at bedtime. Marland Kitchen  L-LYSINE PO, Take 1,000 mg by mouth daily. .  magnesium 30 MG tablet, Take 30 mg by mouth daily. .  meclizine (ANTIVERT) 25 MG tablet, Take 1 tablet (25 mg total) by mouth 3 (three) times daily as needed for dizziness. .  Multiple Vitamin (MULTIVITAMIN PO), Take 1 capsule by mouth daily.  .  NON FORMULARY, Doterra- alpha CRS and Cellular Vitality complex- 2 daily .  NON FORMULARY, Doterra Micro Plex VMz Food Nutrient complex- 2 daily .  NON FORMULARY, Doterra- EO Mega essential oil omega complex-  2 daily  .  omeprazole (PRILOSEC) 20 MG capsule, Take 20 mg by mouth daily. Marland Kitchen  PENNSAID 2 % SOLN, APPLY TWO PUMPS (40MG ) TOPICALLY TO AFFECTED AREA(S) TWICE A DAY .  triamcinolone cream (KENALOG) 0.1 %, Apply 1 application topically 2 (two) times daily. For 1-2 weeks. (Patient taking differently:  Apply 1 application topically 2 (two) times daily as needed (rash). ) .  TURMERIC PO, Take 2 capsules by mouth. .  vitamin C (ASCORBIC ACID) 250 MG tablet, Take 500 mg by mouth daily. Marland Kitchen  gabapentin (NEURONTIN) 100 MG capsule, Take 2 capsules (200 mg total) by mouth at bedtime.  Current Facility-Administered Medications (Other):  .  0.9 %  sodium chloride infusion   Reviewed prior external information including notes and imaging from  primary care provider As  well as notes that were available from care everywhere and other healthcare systems.  Past medical history, social, surgical and family history all reviewed in electronic medical record.  No pertanent information unless stated regarding to the chief complaint.   Review of Systems:  No headache, visual changes, nausea, vomiting, diarrhea, constipation, dizziness, abdominal pain, skin rash, fevers, chills, night sweats, weight loss, swollen lymph nodes, body aches, joint swelling, chest pain, shortness of breath, mood changes. POSITIVE muscle aches  Objective  Blood pressure 126/80, pulse 90, height 5\' 6"  (1.676 m), weight 172 lb (78 kg), SpO2 92 %.   General: No apparent distress alert and oriented x3 mood and affect normal, dressed appropriately.  HEENT: Pupils equal, extraocular movements intact  Respiratory: Patient's speak in full sentences and does not appear short of breath  Cardiovascular: No lower extremity edema, non tender, no erythema  Gait normal with good balance and coordination.  MSK: Bilateral discomfort over the greater trochanteric area right greater than left. Patient does have positive Corky Sox on the right greater than left. Negative straight leg test. Back some very mild loss of lordosis.   Procedure: Real-time Ultrasound Guided Injection of right greater trochanteric bursitis secondary to patient's body habitus Device: GE Logiq Q7 Ultrasound guided injection is preferred based studies that show increased  duration, increased effect, greater accuracy, decreased procedural pain, increased response rate, and decreased cost with ultrasound guided versus blind injection.  Verbal informed consent obtained.  Time-out conducted.  Noted no overlying erythema, induration, or other signs of local infection.  Skin prepped in a sterile fashion.  Local anesthesia: Topical Ethyl chloride.  With sterile technique and under real time ultrasound guidance:  Greater trochanteric area was visualized and patient's bursa was noted. A 22-gauge 3 inch needle was inserted and 4 cc of 0.5% Marcaine and 1 cc of Kenalog 40 mg/dL was injected. Pictures taken Completed without difficulty  Pain immediately resolved suggesting accurate placement of the medication.  Advised to call if fevers/chills, erythema, induration, drainage, or persistent bleeding.  Impression: Technically successful ultrasound guided injection.   Procedure: Real-time Ultrasound Guided Injection of left  greater trochanteric bursitis secondary to patient's body habitus Device: GE Logiq Q7  Ultrasound guided injection is preferred based studies that show increased duration, increased effect, greater accuracy, decreased procedural pain, increased response rate, and decreased cost with ultrasound guided versus blind injection.  Verbal informed consent obtained.  Time-out conducted.  Noted no overlying erythema, induration, or other signs of local infection.  Skin prepped in a sterile fashion.  Local anesthesia: Topical Ethyl chloride.  With sterile technique and under real time ultrasound guidance:  Greater trochanteric area was visualized and patient's bursa was noted. A 22-gauge 3 inch needle was inserted and 4 cc of 0.5% Marcaine and 1 cc of Kenalog 40 mg/dL was injected. Pictures taken Completed without difficulty  Pain immediately resolved suggesting accurate placement of the medication.  Advised to call if fevers/chills, erythema, induration,  drainage, or persistent bleeding.  Impression: Technically successful ultrasound guided injection.   Impression and Recommendations:     The above documentation has been reviewed and is accurate and complete Lyndal Pulley, DO

## 2020-06-02 ENCOUNTER — Ambulatory Visit: Payer: Self-pay

## 2020-06-02 ENCOUNTER — Other Ambulatory Visit: Payer: Self-pay

## 2020-06-02 ENCOUNTER — Ambulatory Visit (INDEPENDENT_AMBULATORY_CARE_PROVIDER_SITE_OTHER): Payer: 59 | Admitting: Family Medicine

## 2020-06-02 ENCOUNTER — Encounter: Payer: Self-pay | Admitting: Family Medicine

## 2020-06-02 VITALS — BP 126/80 | HR 90 | Ht 66.0 in | Wt 172.0 lb

## 2020-06-02 DIAGNOSIS — M25552 Pain in left hip: Secondary | ICD-10-CM

## 2020-06-02 DIAGNOSIS — M7061 Trochanteric bursitis, right hip: Secondary | ICD-10-CM

## 2020-06-02 DIAGNOSIS — M7062 Trochanteric bursitis, left hip: Secondary | ICD-10-CM | POA: Diagnosis not present

## 2020-06-02 DIAGNOSIS — M25551 Pain in right hip: Secondary | ICD-10-CM | POA: Diagnosis not present

## 2020-06-02 MED ORDER — GABAPENTIN 100 MG PO CAPS: 200.0000 mg | ORAL_CAPSULE | Freq: Every day | ORAL | 3 refills | Status: DC

## 2020-06-02 NOTE — Patient Instructions (Addendum)
Good to see you Gabapentin refilled See me again in 8 weeks if pain worsens we will consider MRI

## 2020-06-02 NOTE — Assessment & Plan Note (Signed)
Patient given bilateral injections again today. Hopefully this will be very beneficial. Very happy the patient's neck is doing significantly better. Refilled gabapentin to help. Patient's x-rays do not show any significant arthritic changes of the back and can do an MRI if necessary. Hoping that this does respond well. Encourage patient do the exercise more regularly and follow-up with me again in 2 months

## 2020-06-04 ENCOUNTER — Ambulatory Visit: Payer: 59 | Admitting: Physical Therapy

## 2020-06-05 ENCOUNTER — Other Ambulatory Visit: Payer: Self-pay

## 2020-06-05 ENCOUNTER — Encounter: Payer: Self-pay | Admitting: Physical Therapy

## 2020-06-05 ENCOUNTER — Ambulatory Visit: Payer: 59 | Admitting: Physical Therapy

## 2020-06-05 DIAGNOSIS — M79602 Pain in left arm: Secondary | ICD-10-CM

## 2020-06-05 DIAGNOSIS — M542 Cervicalgia: Secondary | ICD-10-CM | POA: Diagnosis not present

## 2020-06-05 DIAGNOSIS — G8929 Other chronic pain: Secondary | ICD-10-CM

## 2020-06-05 DIAGNOSIS — R293 Abnormal posture: Secondary | ICD-10-CM

## 2020-06-05 DIAGNOSIS — M25512 Pain in left shoulder: Secondary | ICD-10-CM

## 2020-06-05 NOTE — Therapy (Signed)
[facility] [address] [place], [place], [address] Phone: [phone]   Fax:  [phone]  Physical Therapy Treatment/Recertification  Patient Details  Name: Kelly Gilbert MRN: [id number] Date of Birth: 05/20/1961 Referring Provider (PT): Zachary Smith, DO   Encounter Date: 06/05/2020   PT End of Session - 06/05/20 1316    Visit Number 9    Number of Visits 12    Date for PT Re-Evaluation 06/19/20    Authorization Type [employer], no ionto    PT Start Time 1316    PT Stop Time 1401    PT Time Calculation (min) 45 min    Activity Tolerance Patient tolerated treatment well    Behavior During Therapy WFL for tasks assessed/performed           Past Medical History:  Diagnosis Date  . Arthritis   . GERD (gastroesophageal reflux disease)   . Lesion of bladder   . Migraine headache   . PONV (postoperative nausea and vomiting)     Past Surgical History:  Procedure Laterality Date  . BENIGN LEFT BREASET BX  1992  . COLONOSCOPY    . CYSTOSCOPY WITH BIOPSY  01/16/2012   Procedure: CYSTOSCOPY WITH BIOPSY;  Surgeon: Daniel Young Woodruff, MD;  Location: [facility];  Service: Urology;  Laterality: N/A;  1 hour requested for this case  C-ARM CAMERA    . VAGINAL HYSTERECTOMY  1999   EPIDURAL ANES.    There were no vitals filed for this visit.   Subjective Assessment - 06/05/20 1630    Subjective Pt. returns for therapy for neck noting significant improvement after recent epidural injection/overall symptoms around 90% improved from baseline. No pain pre-tx. She also saw MD for vertigo symptoms with diagnosis suspected BPPV and reports vertigo symptoms improved. See assessment/plan.    Currently in Pain? No/denies              [facility] PT Assessment - 06/05/20 0001      Observation/Other Assessments   Focus on Therapeutic Outcomes (FOTO)  41% limited      AROM   Cervical Flexion 30    Cervical - Right  Side Bend 32    Cervical - Left Side Bend 40    Cervical - Right Rotation 60    Cervical - Left Rotation 65                         [facility] Adult PT Treatment/Exercise - 06/05/20 0001      Neck Exercises: Supine   Capital Flexion Limitations retraction to cervical flexion x 20 reps      Traction   Type of Traction Cervical    Min (lbs) 12    Max (lbs) 20    Hold Time 60    Rest Time 20    Time 15      Manual Therapy   Joint Mobilization thoracic PAs grade I-IV    Soft tissue mobilization left rhomboid and levator region in prone    Myofascial Release suboccipital release      Neck Exercises: Stretches   Upper Trapezius Stretch Left;3 reps;30 seconds    Levator Stretch Left;3 reps;30 seconds                  PT Education - 06/05/20 1633    Education Details POC, avoid/minimize activities involving significant or prolonged cervical extension    Person(s) Educated Patient    Methods Explanation      Comprehension Verbalized understanding            PT Short Term Goals - 06/05/20 1640      PT SHORT TERM GOAL #1   Title pt will demo consistent retraction of scapula    Baseline improving from baseline    Time 3    Period Weeks    Status On-going      PT SHORT TERM GOAL #2   Title pt will be independent in short term HEP without increase in neck/UE pain    Baseline met    Time 3    Period Weeks    Status Achieved             PT Long Term Goals - 06/05/20 1640      PT LONG TERM GOAL #1   Title pt will be able to return to side sleeping without limitation by pain    Baseline met/able    Time 6    Period Weeks    Status Achieved      PT LONG TERM GOAL #2   Title Cervical rotation & sidebend within 5 deg Rt to Lt    Baseline met for rotation, ongoing for sidebending    Time 2    Period Weeks    Status Partially Met    Target Date 06/19/20      PT LONG TERM GOAL #3   Title resolution of Lt distal arm pain    Baseline 90% improved  for symptoms    Time 2    Period Weeks    Status On-going    Target Date 06/19/20      PT LONG TERM GOAL #4   Title FOTO to 35% limitation    Baseline 41% limited    Time 2    Period Weeks    Status On-going    Target Date 06/19/20                 Plan - 06/05/20 1635    Clinical Impression Statement Good progress with recent improvement after epidural injection for decreased neck pain and left UE radicular symptoms. Pt. has also responded well in therapy to a combination of flexion bias cervical ROM and traction. Still with associated functional limitations per FOTO assessment but good progress overall/otherwise with therapy goals. She is curently scheduled for 3 more therapy visit into mid-November but plan have pt. see how HEP goes over the coming days and can cancel visits if not needed, otherwise plan continue/progress therapy for 2 more weeks with plan for d/c after.    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, h/o RC injury, h/o wrist injury both on Lt UE    Examination-Activity Limitations Sit;Sleep;Bend;Stand;Carry;Lift;Reach Overhead    Examination-Participation Restrictions Cleaning;Driving;Occupation    Stability/Clinical Decision Making Evolving/Moderate complexity    Clinical Decision Making Moderate    Rehab Potential Good    PT Frequency 2x / week    PT Duration 2 weeks    PT Treatment/Interventions ADLs/Self Care Home Management;Cryotherapy;Electrical Stimulation;Moist Heat;Traction;Therapeutic exercise;Therapeutic activities;Functional mobility training;Ultrasound;Neuromuscular re-education;Patient/family education;Manual techniques;Taping;Dry needling;Passive range of motion;Spinal Manipulations;Joint Manipulations    PT Next Visit Plan update HEP prn, continue traction, continue flexion bias ROM and stretches, postural strengthening and DNF endurance    PT Home Exercise  Plan scap retraction/postural awareness, wrist extensor stretch, cervical retractions, cervical flexion stretch, left upper trapezius and levator stretch, Theraband rows, extension and supine horizontal abduction  Consulted and Agree with Plan of Care Patient           Patient will benefit from skilled therapeutic intervention in order to improve the following deficits and impairments:  Decreased activity tolerance, Pain, Impaired UE functional use, Increased muscle spasms, Decreased range of motion, Improper body mechanics, Postural dysfunction  Visit Diagnosis: Cervicalgia  Chronic left shoulder pain  Pain in left arm  Abnormal posture     Problem List Patient Active Problem List   Diagnosis Date Noted  . Cervical disc disorder with radiculopathy of cervical region 03/17/2020  . Greater trochanteric bursitis of both hips 10/23/2019  . Left hand paresthesia 07/02/2013  . ADD (attention deficit disorder) 03/18/2013  . Hematuria 03/18/2013  . History of migraine 10/12/2011  . DEPRESSION 09/23/2009  . GERD 09/23/2009    Beaulah Dinning, PT, DPT 06/05/20 4:43 PM  [facility] [address] [place], [place], [address] Phone: [phone]   Fax:  [phone]  Name: KAYNA SUPPA MRN: [id number] Date of Birth: 08/15/1960

## 2020-06-09 ENCOUNTER — Ambulatory Visit: Payer: 59 | Admitting: Physical Therapy

## 2020-06-11 ENCOUNTER — Ambulatory Visit: Payer: 59 | Admitting: Physical Therapy

## 2020-06-18 ENCOUNTER — Encounter: Payer: 59 | Admitting: Physical Therapy

## 2020-06-25 ENCOUNTER — Institutional Professional Consult (permissible substitution): Payer: 59 | Admitting: Pulmonary Disease

## 2020-06-29 ENCOUNTER — Ambulatory Visit (INDEPENDENT_AMBULATORY_CARE_PROVIDER_SITE_OTHER): Payer: 59 | Admitting: Pulmonary Disease

## 2020-06-29 ENCOUNTER — Encounter: Payer: Self-pay | Admitting: Pulmonary Disease

## 2020-06-29 ENCOUNTER — Other Ambulatory Visit: Payer: Self-pay

## 2020-06-29 VITALS — BP 120/74 | HR 84 | Temp 98.4°F | Ht 66.5 in | Wt 171.8 lb

## 2020-06-29 DIAGNOSIS — R0600 Dyspnea, unspecified: Secondary | ICD-10-CM

## 2020-06-29 DIAGNOSIS — Z720 Tobacco use: Secondary | ICD-10-CM | POA: Diagnosis not present

## 2020-06-29 DIAGNOSIS — R053 Chronic cough: Secondary | ICD-10-CM | POA: Diagnosis not present

## 2020-06-29 DIAGNOSIS — R0609 Other forms of dyspnea: Secondary | ICD-10-CM

## 2020-06-29 MED ORDER — SPIRIVA RESPIMAT 2.5 MCG/ACT IN AERS: 2.0000 | INHALATION_SPRAY | Freq: Every day | RESPIRATORY_TRACT | 0 refills | Status: DC

## 2020-06-29 NOTE — Progress Notes (Signed)
Synopsis: Referred by Carolann Littler, MD for exertional dyspnea  Subjective:   PATIENT ID: Kelly Gilbert GENDER: female DOB: 08-11-1960, MRN: [id number]   HPI  Chief Complaint  Patient presents with   Consult    DOE    Kelly Gilbert is a 59 year old woman, daily smoker with history of GERD who is referred to pulmonary clinic for exertional dyspnea.   She has had exertional dyspnea over several years which has been progressive. She denies noticing overt wheezing. She does have daily productive cough of brownish-yellow sputum. She recently quit smoking on 06/26/20. She has quit multiple times in the past during her pregnancies. She has tried chantix and experienced horrible dreams. She has also tried wellbutrin at some point.   She started smoking at age 54 and has smoked 0.5 packs per day mainly. No lung cancer history in the family. She is following a smoking cessation book which is helping her quit this time.   She does complain of sinus congestion with post-nasal drip which is worse around this time of year. She recently started flonase nasal spray with some improvement. She is also taking generic clariton for her seasonal allergies. She is on PPI therapy for her GERD and denies   Past Medical History:  Diagnosis Date   Arthritis    GERD (gastroesophageal reflux disease)    Lesion of bladder    Migraine headache    PONV (postoperative nausea and vomiting)      Family History  Problem Relation Age of Onset   Hypertension Father    Cancer Father        prostate   Hypertension Maternal Grandmother    Cancer Mother 51       renal cell cancer   Colon cancer Neg Hx    Pancreatic cancer Neg Hx    Stomach cancer Neg Hx    Esophageal cancer Neg Hx      Social History   Socioeconomic History   Marital status: Married    Spouse name: Not on file   Number of children: 2   Years of education: Not on file   Highest education level: Not on file    Occupational History    Comment: Realtor  Tobacco Use   Smoking status: Former Smoker    Packs/day: 0.25    Years: 35.00    Pack years: 8.75    Types: Cigarettes    Quit date: 06/26/2020   Smokeless tobacco: Never Used   Tobacco comment: QUIT SMOKING 2008--  RECENTLY STARTED BACK 7-8 CIG. PER DAY  Vaping Use   Vaping Use: Never used  Substance and Sexual Activity   Alcohol use: Yes    Alcohol/week: 2.0 standard drinks    Types: 2 Shots of liquor per week    Comment: Occasional   Drug use: Never   Sexual activity: Not on file  Other Topics Concern   Not on file  Social History Narrative   Not on file   Social Determinants of Health   Financial Resource Strain:    Difficulty of Paying Living Expenses: Not on file  Food Insecurity:    Worried About [place] in the Last Year: Not on file   Ran Out of Food in the Last Year: Not on file  Transportation Needs:    Lack of Transportation (Medical): Not on file   Lack of Transportation (Non-Medical): Not on file  Physical Activity:    Days of Exercise per Week: Not  on file   Minutes of Exercise per Session: Not on file  Stress:    Feeling of Stress : Not on file  Social Connections:    Frequency of Communication with Friends and Family: Not on file   Frequency of Social Gatherings with Friends and Family: Not on file   Attends Religious Services: Not on file   Active Member of Clubs or Organizations: Not on file   Attends Archivist Meetings: Not on file   Marital Status: Not on file  Intimate Partner Violence:    Fear of Current or Ex-Partner: Not on file   Emotionally Abused: Not on file   Physically Abused: Not on file   Sexually Abused: Not on file     Allergies  Allergen Reactions   Codeine Sulfate Nausea And Vomiting   Sulfa Antibiotics Hives and Swelling     Outpatient Medications Prior to Visit  Medication Sig Dispense Refill    amphetamine-dextroamphetamine (ADDERALL) 15 MG tablet Take 1 tablet by mouth 2 (two) times daily. May refill in one month 60 tablet 0   Aspirin-Acetaminophen-Caffeine (EXCEDRIN PO) Take 1 tablet by mouth daily as needed (headache).      Biotin 5000 MCG CAPS Take 1 capsule by mouth every evening.      Calcium Carb-Cholecalciferol (CALCIUM 600 + D PO) Take by mouth daily.     cholecalciferol (VITAMIN D3) 25 MCG (1000 UNIT) tablet Take 1,000 Units by mouth daily.     gabapentin (NEURONTIN) 100 MG capsule Take 2 capsules (200 mg total) by mouth at bedtime. 180 capsule 0   gabapentin (NEURONTIN) 100 MG capsule Take 2 capsules (200 mg total) by mouth at bedtime. 180 capsule 3   imipramine (TOFRANIL) 25 MG tablet Take 1 tablet (25 mg total) by mouth at bedtime. 90 tablet 3   L-LYSINE PO Take 1,000 mg by mouth daily.     magnesium 30 MG tablet Take 30 mg by mouth daily.     meclizine (ANTIVERT) 25 MG tablet Take 1 tablet (25 mg total) by mouth 3 (three) times daily as needed for dizziness. 30 tablet 0   Multiple Vitamin (MULTIVITAMIN PO) Take 1 capsule by mouth daily.      NON FORMULARY Doterra- alpha CRS and Cellular Vitality complex- 2 daily     NON FORMULARY Doterra Micro Plex VMz Food Nutrient complex- 2 daily     NON FORMULARY Doterra- EO Mega essential oil omega complex-  2 daily      omeprazole (PRILOSEC) 20 MG capsule Take 20 mg by mouth daily.     PENNSAID 2 % SOLN APPLY TWO PUMPS (40MG ) TOPICALLY TO AFFECTED AREA(S) TWICE A DAY 112 g 2   triamcinolone cream (KENALOG) 0.1 % Apply 1 application topically 2 (two) times daily. For 1-2 weeks. (Patient taking differently: Apply 1 application topically 2 (two) times daily as needed (rash). ) 45 g 0   TURMERIC PO Take 2 capsules by mouth.     vitamin C (ASCORBIC ACID) 250 MG tablet Take 500 mg by mouth daily.     meloxicam (MOBIC) 15 MG tablet Take 1 tablet (15 mg total) by mouth daily. (Patient not taking: Reported on 06/29/2020)  30 tablet 0   Facility-Administered Medications Prior to Visit  Medication Dose Route Frequency Provider Last Rate Last Admin   0.9 %  sodium chloride infusion  500 mL Intravenous Once Pyrtle, Lajuan Lines, MD        Review of Systems  Constitutional: Negative for chills,  diaphoresis, fever, malaise/fatigue and weight loss.  HENT: Positive for congestion (and post nasal drip).   Respiratory: Positive for cough, sputum production and shortness of breath. Negative for hemoptysis.   Cardiovascular: Positive for chest pain and palpitations.  Gastrointestinal: Negative for abdominal pain, heartburn and nausea.  Genitourinary: Negative.   Musculoskeletal: Negative.   Neurological: Positive for headaches.  Psychiatric/Behavioral: Negative.    Objective:   Vitals:   06/29/20 1452  BP: 120/74  Pulse: 84  Temp: 98.4 F (36.9 C)  TempSrc: Oral  SpO2: 95%  Weight: 171 lb 12.8 oz (77.9 kg)  Height: 5' 6.5" (1.689 m)     Physical Exam Constitutional:      General: She is not in acute distress.    Appearance: Normal appearance. She is not ill-appearing.  HENT:     Head: Normocephalic and atraumatic.     Nose: Nose normal.     Mouth/Throat:     Mouth: Mucous membranes are moist.     Pharynx: Oropharynx is clear.  Eyes:     General: No scleral icterus.    Conjunctiva/sclera: Conjunctivae normal.     Pupils: Pupils are equal, round, and reactive to light.  Cardiovascular:     Rate and Rhythm: Normal rate and regular rhythm.     Pulses: Normal pulses.     Heart sounds: Normal heart sounds. No murmur heard.   Pulmonary:     Effort: Pulmonary effort is normal. No respiratory distress.     Breath sounds: No wheezing, rhonchi or rales.  Abdominal:     General: Bowel sounds are normal.     Palpations: Abdomen is soft.  Musculoskeletal:     Right lower leg: No edema.     Left lower leg: No edema.  Skin:    General: Skin is warm and dry.  Neurological:     General: No focal deficit  present.     Mental Status: She is alert.  Psychiatric:        Mood and Affect: Mood normal.        Behavior: Behavior normal.        Thought Content: Thought content normal.        Judgment: Judgment normal.     CBC    Component Value Date/Time   WBC 7.1 09/24/2019 1608   RBC 4.34 09/24/2019 1608   HGB 12.6 09/24/2019 1608   HCT 39.3 09/24/2019 1608   PLT 343 09/24/2019 1608   MCV 90.6 09/24/2019 1608   MCH 29.0 09/24/2019 1608   MCHC 32.1 09/24/2019 1608   RDW 14.0 09/24/2019 1608   LYMPHSABS 2.8 10/12/2018 1453   MONOABS 0.6 10/12/2018 1453   EOSABS 0.1 10/12/2018 1453   BASOSABS 0.1 10/12/2018 1453   BMP Latest Ref Rng & Units 09/24/2019 12/18/2015 03/28/2014  Glucose 70 - 99 mg/dL 93 96 83  BUN 6 - 20 mg/dL 15 16 16   Creatinine 0.44 - 1.00 mg/dL 0.79 0.69 0.7  Sodium 135 - 145 mmol/L 142 139 139  Potassium 3.5 - 5.1 mmol/L 3.9 3.7 4.7  Chloride 98 - 111 mmol/L 106 104 103  CO2 22 - 32 mmol/L 28 28 28   Calcium 8.9 - 10.3 mg/dL 9.4 8.8 9.1    Chest imaging: None on file  PFT: No flowsheet data found.    Assessment & Plan:   Dyspnea on exertion - Plan: Pulmonary Function Test  Chronic cough  Tobacco use - Plan: Ambulatory Referral for Lung Cancer Scre  Discussion: Loyola Santino  is a 59 year old woman, recent former smoker who is referred to pulmonary clinic for shortness of breath.   She has exertional shortness of breath along with chronic productive cough concerning for underlying obstructive lung disease based on her history of tobacco use.   We will check complete pulmonary function tests. We have provided her with a sample of Spiriva respimat 2.33mcg 2 puffs twice daily and to let us know if she notices benefit in her breathing. We will provide her with a prescription if needed.   Based on her significant smoking history she does qualify for lung cancer screening so we will place referral to our screening program. She has not chest imaging on file and  will await this CT Chest to also aid in her evaluation for dyspnea and cough.  She is to follow up in 3 months  Freda Jackson, MD [facility] Office: [phone]   See Amion for Pager Details      Current Outpatient Medications:    amphetamine-dextroamphetamine (ADDERALL) 15 MG tablet, Take 1 tablet by mouth 2 (two) times daily. May refill in one month, Disp: 60 tablet, Rfl: 0   Aspirin-Acetaminophen-Caffeine (EXCEDRIN PO), Take 1 tablet by mouth daily as needed (headache). , Disp: , Rfl:    Biotin 5000 MCG CAPS, Take 1 capsule by mouth every evening. , Disp: , Rfl:    Calcium Carb-Cholecalciferol (CALCIUM 600 + D PO), Take by mouth daily., Disp: , Rfl:    cholecalciferol (VITAMIN D3) 25 MCG (1000 UNIT) tablet, Take 1,000 Units by mouth daily., Disp: , Rfl:    gabapentin (NEURONTIN) 100 MG capsule, Take 2 capsules (200 mg total) by mouth at bedtime., Disp: 180 capsule, Rfl: 0   gabapentin (NEURONTIN) 100 MG capsule, Take 2 capsules (200 mg total) by mouth at bedtime., Disp: 180 capsule, Rfl: 3   imipramine (TOFRANIL) 25 MG tablet, Take 1 tablet (25 mg total) by mouth at bedtime., Disp: 90 tablet, Rfl: 3   L-LYSINE PO, Take 1,000 mg by mouth daily., Disp: , Rfl:    magnesium 30 MG tablet, Take 30 mg by mouth daily., Disp: , Rfl:    meclizine (ANTIVERT) 25 MG tablet, Take 1 tablet (25 mg total) by mouth 3 (three) times daily as needed for dizziness., Disp: 30 tablet, Rfl: 0   Multiple Vitamin (MULTIVITAMIN PO), Take 1 capsule by mouth daily. , Disp: , Rfl:    NON FORMULARY, Doterra- alpha CRS and Cellular Vitality complex- 2 daily, Disp: , Rfl:    NON FORMULARY, Doterra Micro Plex VMz Food Nutrient complex- 2 daily, Disp: , Rfl:    NON FORMULARY, Doterra- EO Mega essential oil omega complex-  2 daily , Disp: , Rfl:    omeprazole (PRILOSEC) 20 MG capsule, Take 20 mg by mouth daily., Disp: , Rfl:    PENNSAID 2 % SOLN, APPLY TWO PUMPS (40MG )  TOPICALLY TO AFFECTED AREA(S) TWICE A DAY, Disp: 112 g, Rfl: 2   triamcinolone cream (KENALOG) 0.1 %, Apply 1 application topically 2 (two) times daily. For 1-2 weeks. (Patient taking differently: Apply 1 application topically 2 (two) times daily as needed (rash). ), Disp: 45 g, Rfl: 0   TURMERIC PO, Take 2 capsules by mouth., Disp: , Rfl:    vitamin C (ASCORBIC ACID) 250 MG tablet, Take 500 mg by mouth daily., Disp: , Rfl:    meloxicam (MOBIC) 15 MG tablet, Take 1 tablet (15 mg total) by mouth daily. (Patient not taking: Reported on 06/29/2020),  Disp: 30 tablet, Rfl: 0   Tiotropium Bromide Monohydrate (SPIRIVA RESPIMAT) 2.5 MCG/ACT AERS, Inhale 2 puffs into the lungs daily., Disp: 4 g, Rfl: 0  Current Facility-Administered Medications:    0.9 %  sodium chloride infusion, 500 mL, Intravenous, Once, Pyrtle, Lajuan Lines, MD

## 2020-06-29 NOTE — Patient Instructions (Addendum)
We will schedule you for breathing tests and call you with the results.  Referral to Lung Cancer Screening program made.  Trial Spiriva Respimat inhaler 2.78mcg, 2 puffs daily.   Follow up in 3 months.

## 2020-07-17 NOTE — Therapy (Signed)
[facility] [place], [place], [address] Phone: [phone]   Fax:  [phone]  Physical Therapy Treatment/Discharge  Patient Details  Name: Kelly Gilbert MRN: [id number] Date of Birth: February 10, 1961 Referring Provider (PT): Hulan Saas, DO   Encounter Date: 06/05/2020    Past Medical History:  Diagnosis Date  . Arthritis   . GERD (gastroesophageal reflux disease)   . Lesion of bladder   . Migraine headache   . PONV (postoperative nausea and vomiting)     Past Surgical History:  Procedure Laterality Date  . BENIGN LEFT BREASET BX  1992  . COLONOSCOPY    . CYSTOSCOPY WITH BIOPSY  01/16/2012   Procedure: CYSTOSCOPY WITH BIOPSY;  Surgeon: Molli Hazard, MD;  Location: [facility];  Service: Urology;  Laterality: N/A;  1 hour requested for this case  C-ARM CAMERA    . VAGINAL HYSTERECTOMY  1999   EPIDURAL ANES.    There were no vitals filed for this visit.                                PT Short Term Goals - 06/05/20 1640      PT SHORT TERM GOAL #1   Title pt will demo consistent retraction of scapula    Baseline improving from baseline    Time 3    Period Weeks    Status On-going      PT SHORT TERM GOAL #2   Title pt will be independent in short term HEP without increase in neck/UE pain    Baseline met    Time 3    Period Weeks    Status Achieved             PT Long Term Goals - 06/05/20 1640      PT LONG TERM GOAL #1   Title pt will be able to return to side sleeping without limitation by pain    Baseline met/able    Time 6    Period Weeks    Status Achieved      PT LONG TERM GOAL #2   Title Cervical rotation & sidebend within 5 deg Rt to Lt    Baseline met for rotation, ongoing for sidebending    Time 2    Period Weeks    Status Partially Met    Target Date 06/19/20      PT LONG TERM GOAL #3   Title resolution of Lt  distal arm pain    Baseline 90% improved for symptoms    Time 2    Period Weeks    Status On-going    Target Date 06/19/20      PT LONG TERM GOAL #4   Title FOTO to 35% limitation    Baseline 41% limited    Time 2    Period Weeks    Status On-going    Target Date 06/19/20                  Patient will benefit from skilled therapeutic intervention in order to improve the following deficits and impairments:  Decreased activity tolerance,Pain,Impaired UE functional use,Increased muscle spasms,Decreased range of motion,Improper body mechanics,Postural dysfunction  Visit Diagnosis: Cervicalgia - Plan: PT plan of care cert/re-cert  Chronic left shoulder pain - Plan: PT plan of care cert/re-cert  Pain in left arm - Plan: PT plan of care cert/re-cert  Abnormal posture -  Plan: PT plan of care cert/re-cert     Problem List Patient Active Problem List   Diagnosis Date Noted  . Cervical disc disorder with radiculopathy of cervical region 03/17/2020  . Greater trochanteric bursitis of both hips 10/23/2019  . Left hand paresthesia 07/02/2013  . ADD (attention deficit disorder) 03/18/2013  . Hematuria 03/18/2013  . History of migraine 10/12/2011  . DEPRESSION 09/23/2009  . GERD 09/23/2009       PHYSICAL THERAPY DISCHARGE SUMMARY  Visits from Start of Care: 9  Current functional level related to goals / functional outcomes: Patient did not return for further therapy after last session 06/05/20 with last scheduled visits cancelled. Plan had been to continue with HEP and if doing well cancel visits so no further PT planned at this time. Patient to follow up with MD if having any future changes in status.   Remaining deficits: NA   Education / Equipment: HEP Plan: Patient agrees to discharge.  Patient goals were partially met. Patient is being discharged due to meeting the stated rehab goals.  ?????          Beaulah Dinning, PT, DPT 07/17/20 8:24  AM       [facility] [address] [place], [place], [address] Phone: [phone]   Fax:  [phone]  Name: Kelly Gilbert MRN: [id number] Date of Birth: [birth year]

## 2020-07-20 ENCOUNTER — Other Ambulatory Visit: Payer: Self-pay | Admitting: *Deleted

## 2020-07-20 DIAGNOSIS — Z87891 Personal history of nicotine dependence: Secondary | ICD-10-CM

## 2020-07-21 ENCOUNTER — Other Ambulatory Visit: Payer: Self-pay

## 2020-07-21 ENCOUNTER — Ambulatory Visit (INDEPENDENT_AMBULATORY_CARE_PROVIDER_SITE_OTHER): Payer: 59 | Admitting: Pulmonary Disease

## 2020-07-21 DIAGNOSIS — R0609 Other forms of dyspnea: Secondary | ICD-10-CM

## 2020-07-21 DIAGNOSIS — R0600 Dyspnea, unspecified: Secondary | ICD-10-CM | POA: Diagnosis not present

## 2020-07-21 LAB — PULMONARY FUNCTION TEST
DL/VA % pred: 96 %
DL/VA: 4.04 ml/min/mmHg/L
DLCO cor % pred: 107 %
DLCO cor: 23.08 ml/min/mmHg
DLCO unc % pred: 107 %
DLCO unc: 23.08 ml/min/mmHg
FEF 25-75 Post: 3.6 L/sec
FEF 25-75 Pre: 3.76 L/sec
FEF2575-%Change-Post: -4 %
FEF2575-%Pred-Post: 140 %
FEF2575-%Pred-Pre: 146 %
FEV1-%Change-Post: 0 %
FEV1-%Pred-Post: 117 %
FEV1-%Pred-Pre: 117 %
FEV1-Post: 3.26 L
FEV1-Pre: 3.27 L
FEV1FVC-%Change-Post: 0 %
FEV1FVC-%Pred-Pre: 104 %
FEV6-%Change-Post: -1 %
FEV6-%Pred-Post: 113 %
FEV6-%Pred-Pre: 114 %
FEV6-Post: 3.92 L
FEV6-Pre: 3.97 L
FEV6FVC-%Pred-Post: 103 %
FEV6FVC-%Pred-Pre: 103 %
FVC-%Change-Post: 0 %
FVC-%Pred-Post: 110 %
FVC-%Pred-Pre: 111 %
FVC-Post: 3.93 L
FVC-Pre: 3.97 L
Post FEV1/FVC ratio: 83 %
Post FEV6/FVC ratio: 100 %
Pre FEV1/FVC ratio: 83 %
Pre FEV6/FVC Ratio: 100 %
RV % pred: 95 %
RV: 1.91 L
TLC % pred: 112 %
TLC: 5.96 L

## 2020-07-21 NOTE — Progress Notes (Signed)
Full PFT performed today. °

## 2020-07-26 NOTE — Progress Notes (Signed)
[place] Phone: [phone] Subjective:   [name], am serving as a scribe for Dr. Hulan Saas. This visit occurred during the SARS-CoV-2 public health emergency.  Safety protocols were in place, including screening questions prior to the visit, additional usage of staff PPE, and extensive cleaning of exam room while observing appropriate contact time as indicated for disinfecting solutions.   I'm seeing this patient by the request  of:  Eulas Post, MD  CC: hip and neck pain follow up   [id number]   06/02/2020 Patient given bilateral injections again today. Hopefully this will be very beneficial. Very happy the patient's neck is doing significantly better. Refilled gabapentin to help. Patient's x-rays do not show any significant arthritic changes of the back and can do an MRI if necessary. Hoping that this does respond well. Encourage patient do the exercise more regularly and follow-up with me again in 2 months  Update 07/28/2020 BRAXTYN DORFF is a 59 y.o. female coming in with complaint of bilateral hip pain. Patient states that the injections only gave her 2 weeks of relief. Pain radiates into the lumbar spine. Pain can occasionally radiate into her thigh. Stopped using gabapentin due to cramping in lower legs. Cramps have subsided.   Been in PT since September d/c at need of October   Bilateral GT injected 10/26 had 3 weeks of improvement.  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 Arthritis   . GERD (gastroesophageal reflux disease)   . Lesion of bladder   . Migraine headache   . PONV (postoperative nausea and vomiting)    Past Surgical History:  Procedure Laterality Date  . BENIGN LEFT BREASET BX  1992  .  COLONOSCOPY    . CYSTOSCOPY WITH BIOPSY  01/16/2012   Procedure: CYSTOSCOPY WITH BIOPSY;  Surgeon: Molli Hazard, MD;  Location: [facility];  Service: Urology;  Laterality: N/A;  1 hour requested for this case  [facility]    . VAGINAL HYSTERECTOMY  1999   EPIDURAL ANES.   Social History   Socioeconomic History  . Marital status: Married    Spouse name: Not on file  . Number of children: 2  . Years of education: Not on file  . Highest education level: Not on file  Occupational History    Comment: Realtor  Tobacco Use  . Smoking status: Former Smoker    Packs/day: 0.25    Years: 35.00    Pack years: 8.75    Types: Cigarettes    Quit date: 06/26/2020    Years since quitting: 0.0  . Smokeless tobacco: Never Used  . Tobacco comment: QUIT SMOKING 2008--  RECENTLY STARTED BACK 7-8 CIG. PER DAY  Vaping Use  . Vaping Use: Never used  Substance and Sexual Activity  . Alcohol use: Yes    Alcohol/week: 2.0 standard drinks    Types: 2 Shots of liquor per week    Comment: Occasional  . Drug use: Never  . Sexual activity: Not on file  Other Topics Concern  . Not on file  Social History Narrative  . Not on file   Social Determinants of Health   Financial Resource Strain: Not on file  Food Insecurity: Not on file  Transportation Needs: Not on  file  Physical Activity: Not on file  Stress: Not on file  Social Connections: Not on file   Allergies  Allergen Reactions  . Codeine Sulfate Nausea And Vomiting  . Sulfa Antibiotics Hives and Swelling   Family History  Problem Relation Age of Onset  . Hypertension Father   . Cancer Father        prostate  . Hypertension Maternal Grandmother   . Cancer Mother 6       renal cell cancer  . Colon cancer Neg Hx   . Pancreatic cancer Neg Hx   . Stomach cancer Neg Hx   . Esophageal cancer Neg Hx     Current Outpatient Medications (Endocrine & Metabolic):  .  predniSONE (DELTASONE) 20 MG tablet, Take 2  tablets (40 mg total) by mouth 2 (two) times daily.     Current Outpatient Medications (Respiratory):  Marland Kitchen  Tiotropium Bromide Monohydrate (SPIRIVA RESPIMAT) 2.5 MCG/ACT AERS, Inhale 2 puffs into the lungs daily.   Current Outpatient Medications (Analgesics):  Marland Kitchen  Aspirin-Acetaminophen-Caffeine (EXCEDRIN PO), Take 1 tablet by mouth daily as needed (headache).  .  meloxicam (MOBIC) 15 MG tablet, Take 1 tablet (15 mg total) by mouth daily.     Current Outpatient Medications (Other):  .  amphetamine-dextroamphetamine (ADDERALL) 15 MG tablet, Take 1 tablet by mouth 2 (two) times daily. May refill in one month .  Biotin 5000 MCG CAPS, Take 1 capsule by mouth every evening.  Azucena Freed Serrata (BOSWELLIA PO), Take by mouth. .  Calcium Carb-Cholecalciferol (CALCIUM 600 + D PO), Take by mouth daily. .  cholecalciferol (VITAMIN D3) 25 MCG (1000 UNIT) tablet, Take 1,000 Units by mouth daily. Marland Kitchen  co-enzyme Q-10 30 MG capsule, Take 30 mg by mouth 3 (three) times daily. Marland Kitchen  gabapentin (NEURONTIN) 100 MG capsule, Take 2 capsules (200 mg total) by mouth at bedtime. .  gabapentin (NEURONTIN) 100 MG capsule, Take 2 capsules (200 mg total) by mouth at bedtime. Marland Kitchen  imipramine (TOFRANIL) 25 MG tablet, Take 1 tablet (25 mg total) by mouth at bedtime. Marland Kitchen  L-LYSINE PO, Take 1,000 mg by mouth daily. .  magnesium 30 MG tablet, Take 30 mg by mouth daily. .  meclizine (ANTIVERT) 25 MG tablet, Take 1 tablet (25 mg total) by mouth 3 (three) times daily as needed for dizziness. .  Multiple Vitamin (MULTIVITAMIN PO), Take 1 capsule by mouth daily.  .  NON FORMULARY, Doterra- alpha CRS and Cellular Vitality complex- 2 daily .  NON FORMULARY, Doterra Micro Plex VMz Food Nutrient complex- 2 daily .  NON FORMULARY, Doterra- EO Mega essential oil omega complex-  2 daily  .  omeprazole (PRILOSEC) 20 MG capsule, Take 20 mg by mouth daily. Marland Kitchen  PENNSAID 2 % SOLN, APPLY TWO PUMPS (40MG ) TOPICALLY TO AFFECTED AREA(S) TWICE A  DAY .  triamcinolone cream (KENALOG) 0.1 %, Apply 1 application topically 2 (two) times daily. For 1-2 weeks. (Patient taking differently: Apply 1 application topically 2 (two) times daily as needed (rash).) .  TURMERIC PO, Take 2 capsules by mouth. .  vitamin C (ASCORBIC ACID) 250 MG tablet, Take 500 mg by mouth daily.  Current Facility-Administered Medications (Other):  .  0.9 %  sodium chloride infusion   Reviewed prior external information including notes and imaging from  primary care provider As well as notes that were available from care everywhere and other healthcare systems.  Past medical history, social, surgical and family history all reviewed in electronic medical record.  No pertanent information unless stated regarding to the chief complaint.   Review of Systems:  No headache, visual changes, nausea, vomiting, diarrhea, constipation, dizziness, abdominal pain, skin rash, fevers, chills, night sweats, weight loss, swollen lymph nodes, body aches, joint swelling, chest pain, shortness of breath, mood changes. POSITIVE muscle aches  Objective  Blood pressure 104/74, pulse 78, height 5' 6.5" (1.689 m), weight 176 lb (79.8 kg), SpO2 99 %.   General: No apparent distress alert and oriented x3 mood and affect normal, dressed appropriately.  HEENT: Pupils equal, extraocular movements intact  Respiratory: Patient's speak in full sentences and does not appear short of breath  Cardiovascular: No lower extremity edema, non tender, no erythema  Patient does have some mild loss of lordosis of the lumbar spine.  Patient has very mild tightness of Corky Sox right greater than left, mild discomfort over the greater trochanteric areas bilaterally.  Tightness noted with straight leg test but no radicular symptoms.  Minimal discomfort more around the sacroiliac joints bilaterally. Neck exam mild tightness.  Negative Spurling's though noted at this time.   Impression and Recommendations:     The  above documentation has been reviewed and is accurate and complete Lyndal Pulley, DO

## 2020-07-28 ENCOUNTER — Encounter: Payer: Self-pay | Admitting: Family Medicine

## 2020-07-28 ENCOUNTER — Ambulatory Visit (INDEPENDENT_AMBULATORY_CARE_PROVIDER_SITE_OTHER): Payer: 59 | Admitting: Family Medicine

## 2020-07-28 ENCOUNTER — Other Ambulatory Visit: Payer: Self-pay

## 2020-07-28 DIAGNOSIS — M5010 Cervical disc disorder with radiculopathy, unspecified cervical region: Secondary | ICD-10-CM

## 2020-07-28 DIAGNOSIS — M7062 Trochanteric bursitis, left hip: Secondary | ICD-10-CM

## 2020-07-28 DIAGNOSIS — M7061 Trochanteric bursitis, right hip: Secondary | ICD-10-CM

## 2020-07-28 MED ORDER — PREDNISONE 20 MG PO TABS: 40.0000 mg | ORAL_TABLET | Freq: Two times a day (BID) | ORAL | 0 refills | Status: DC

## 2020-07-28 MED ORDER — PREDNISONE 20 MG PO TABS: 40.0000 mg | ORAL_TABLET | Freq: Every day | ORAL | 0 refills | Status: DC

## 2020-07-28 NOTE — Patient Instructions (Addendum)
Prednisone 40 mg for 5 days- watch blood pressure Keep doing exercises and pelvic floor exercises Ice is your friend Send message in 3 weeks and will get MRI  See me again in 6 weeks

## 2020-07-28 NOTE — Assessment & Plan Note (Signed)
Patient is some mild increase in discomfort recently.  Will do the 40 mg once daily.

## 2020-07-28 NOTE — Assessment & Plan Note (Signed)
Patient did not respond as well to the injections with only 3 weeks of improvement.  Patient has had known degenerative disc disease and sacroiliac pain previously.  We will get new x-rays.  Patient will also be started on prednisone and see if this will help.  Starting to have some increasing neck pain so this could be helpful.  Prescription was written for 40 mg twice daily with #10.  We discussed we only 40 mg once daily.  This was written in patient's AVS.  We did called to reiterate this.  Worsening pain I do feel that another MRI is necessary.

## 2020-08-19 ENCOUNTER — Telehealth: Payer: Self-pay | Admitting: Acute Care

## 2020-08-20 ENCOUNTER — Encounter: Payer: Self-pay | Admitting: Family Medicine

## 2020-08-24 ENCOUNTER — Inpatient Hospital Stay: Admission: RE | Admit: 2020-08-24 | Payer: 59 | Source: Ambulatory Visit

## 2020-08-24 ENCOUNTER — Encounter: Payer: 59 | Admitting: Acute Care

## 2020-08-25 NOTE — Telephone Encounter (Signed)
I have left a message for the patient to call me back with the new [employer] ID # which I now know is [id number] under Kelly Gilbert

## 2020-08-25 NOTE — Telephone Encounter (Signed)
Langley Gauss when we get Mrs. Cates rescheduled I will need a new Ct order to schedule her at [facility]. I just tried to get th PA for CT at [facility] and they are not in network according to [place].

## 2020-08-25 NOTE — Telephone Encounter (Signed)
Left message for pt to call back to reschedule shared decision visit and low dose ct.

## 2020-08-27 NOTE — Telephone Encounter (Signed)
Left message for pt to call back to r/s lung cancer screening and CT.  Will close this message and refer to referral notes.

## 2020-09-08 ENCOUNTER — Ambulatory Visit (INDEPENDENT_AMBULATORY_CARE_PROVIDER_SITE_OTHER): Payer: BC Managed Care – PPO | Admitting: Family Medicine

## 2020-09-08 ENCOUNTER — Encounter: Payer: Self-pay | Admitting: Family Medicine

## 2020-09-08 ENCOUNTER — Other Ambulatory Visit: Payer: Self-pay

## 2020-09-08 VITALS — BP 118/86 | HR 93 | Ht 66.5 in | Wt 173.0 lb

## 2020-09-08 DIAGNOSIS — G8929 Other chronic pain: Secondary | ICD-10-CM | POA: Diagnosis not present

## 2020-09-08 DIAGNOSIS — M545 Low back pain, unspecified: Secondary | ICD-10-CM | POA: Insufficient documentation

## 2020-09-08 DIAGNOSIS — M5416 Radiculopathy, lumbar region: Secondary | ICD-10-CM

## 2020-09-08 DIAGNOSIS — M5440 Lumbago with sciatica, unspecified side: Secondary | ICD-10-CM | POA: Diagnosis not present

## 2020-09-08 NOTE — Patient Instructions (Addendum)
MRI lumbar and pelvis-[place] [phone]

## 2020-09-08 NOTE — Progress Notes (Signed)
[place] [address] [place] Phone: [phone] Subjective:   Kelly Gilbert, am serving as a scribe for Dr. Hulan Saas. This visit occurred during the SARS-CoV-2 public health emergency.  Safety protocols were in place, including screening questions prior to the visit, additional usage of staff PPE, and extensive cleaning of exam room while observing appropriate contact time as indicated for disinfecting solutions.  I'm seeing this patient by the request  of:  Eulas Post, MD  CC: Low back and bilateral hip pain follow-up  [id number]   07/28/2020 Patient did not respond as well to the injections with only 3 weeks of improvement.  Patient has had known degenerative disc disease and sacroiliac pain previously.  We will get new x-rays.  Patient will also be started on prednisone and see if this will help.  Starting to have some increasing neck pain so this could be helpful.  Prescription was written for 40 mg twice daily with #10.  We discussed we only 40 mg once daily.  This was written in patient's AVS.  We did called to reiterate this.  Worsening pain I do feel that another MRI is necessary.  Patient is some mild increase in discomfort recently.  Will do the 40 mg once daily.   Update 09/08/2020 Kelly Gilbert is a 60 y.o. female coming in with complaint of bilateral hip pain and neck pain. Patient states that she continues to have pain in SI joint into both glutes that radiates into quads. Did a lot of walking at the beach recently doing a lot of shopping. Prednisone did help but her pain is now worse then it was before her last visit. Has been using Advil for pain relief.  Patient states if anything feels like she is worsening over the course of time.  Patient states that she is frustrated because this pain has been on and off for greater than a decade now.  Patient states it is affecting daily activities.  Looking for some  other options to help.      Past Medical History:  Diagnosis Date   Arthritis    GERD (gastroesophageal reflux disease)    Lesion of bladder    Migraine headache    PONV (postoperative nausea and vomiting)    Past Surgical History:  Procedure Laterality Date   BENIGN LEFT BREASET BX  1992   COLONOSCOPY     CYSTOSCOPY WITH BIOPSY  01/16/2012   Procedure: CYSTOSCOPY WITH BIOPSY;  Surgeon: Molli Hazard, MD;  Location: [facility];  Service: Urology;  Laterality: N/A;  1 hour requested for this case  [place].   Social History   Socioeconomic History   Marital status: Married    Spouse name: Not on file   Number of children: 2   Years of education: Not on file   Highest education level: Not on file  Occupational History    Comment: Realtor  Tobacco Use   Smoking status: Former Smoker    Packs/day: 0.25    Years: 35.00    Pack years: 8.75    Types: Cigarettes    Quit date: 06/26/2020    Years since quitting: 0.2   Smokeless tobacco: Never Used   Tobacco comment: QUIT SMOKING 2008--  RECENTLY STARTED BACK 7-8 CIG. PER DAY  Vaping Use   Vaping Use: Never used  Substance and Sexual Activity  Alcohol use: Yes    Alcohol/week: 2.0 standard drinks    Types: 2 Shots of liquor per week    Comment: Occasional   Drug use: Never   Sexual activity: Not on file  Other Topics Concern   Not on file  Social History Narrative   Not on file   Social Determinants of Health   Financial Resource Strain: Not on file  Food Insecurity: Not on file  Transportation Needs: Not on file  Physical Activity: Not on file  Stress: Not on file  Social Connections: Not on file   Allergies  Allergen Reactions   Codeine Sulfate Nausea And Vomiting   Sulfa Antibiotics Hives and Swelling   Family History  Problem Relation Age of Onset   Hypertension Father    Cancer Father         prostate   Hypertension Maternal Grandmother    Cancer Mother 40       renal cell cancer   Colon cancer Neg Hx    Pancreatic cancer Neg Hx    Stomach cancer Neg Hx    Esophageal cancer Neg Hx     Current Outpatient Medications (Endocrine & Metabolic):    Estradiol-Progesterone 1-100 MG CAPS, Take by mouth.   predniSONE (DELTASONE) 20 MG tablet, Take 2 tablets (40 mg total) by mouth daily with breakfast.     Current Outpatient Medications (Respiratory):    Tiotropium Bromide Monohydrate (SPIRIVA RESPIMAT) 2.5 MCG/ACT AERS, Inhale 2 puffs into the lungs daily.   Current Outpatient Medications (Analgesics):    Aspirin-Acetaminophen-Caffeine (EXCEDRIN PO), Take 1 tablet by mouth daily as needed (headache).    meloxicam (MOBIC) 15 MG tablet, Take 1 tablet (15 mg total) by mouth daily.     Current Outpatient Medications (Other):    amphetamine-dextroamphetamine (ADDERALL) 15 MG tablet, Take 1 tablet by mouth 2 (two) times daily. May refill in one month   Biotin 5000 MCG CAPS, Take 1 capsule by mouth every evening.    Boswellia Serrata (BOSWELLIA PO), Take by mouth.   Calcium Carb-Cholecalciferol (CALCIUM 600 + D PO), Take by mouth daily.   cholecalciferol (VITAMIN D3) 25 MCG (1000 UNIT) tablet, Take 1,000 Units by mouth daily.   co-enzyme Q-10 30 MG capsule, Take 30 mg by mouth 3 (three) times daily.   gabapentin (NEURONTIN) 100 MG capsule, Take 2 capsules (200 mg total) by mouth at bedtime.   gabapentin (NEURONTIN) 100 MG capsule, Take 2 capsules (200 mg total) by mouth at bedtime.   imipramine (TOFRANIL) 25 MG tablet, Take 1 tablet (25 mg total) by mouth at bedtime.   L-LYSINE PO, Take 1,000 mg by mouth daily.   magnesium 30 MG tablet, Take 30 mg by mouth daily.   meclizine (ANTIVERT) 25 MG tablet, Take 1 tablet (25 mg total) by mouth 3 (three) times daily as needed for dizziness.   Multiple Vitamin (MULTIVITAMIN PO), Take 1 capsule by mouth daily.     NON FORMULARY, Doterra- alpha CRS and Cellular Vitality complex- 2 daily   NON FORMULARY, Doterra Micro Plex VMz Food Nutrient complex- 2 daily   NON FORMULARY, Doterra- EO Mega essential oil omega complex-  2 daily    omeprazole (PRILOSEC) 20 MG capsule, Take 20 mg by mouth daily.   PENNSAID 2 % SOLN, APPLY TWO PUMPS (40MG ) TOPICALLY TO AFFECTED AREA(S) TWICE A DAY   triamcinolone cream (KENALOG) 0.1 %, Apply 1 application topically 2 (two) times daily. For 1-2 weeks. (Patient taking differently: Apply 1 application topically 2 (  two) times daily as needed (rash).)   TURMERIC PO, Take 2 capsules by mouth.   vitamin C (ASCORBIC ACID) 250 MG tablet, Take 500 mg by mouth daily.  Current Facility-Administered Medications (Other):    0.9 %  sodium chloride infusion   Reviewed prior external information including notes and imaging from  primary care provider As well as notes that were available from care everywhere and other healthcare systems.  Past medical history, social, surgical and family history all reviewed in electronic medical record.  Gilbert pertanent information unless stated regarding to the chief complaint.   Review of Systems:  Gilbert headache, visual changes, nausea, vomiting, diarrhea, constipation, dizziness, abdominal pain, skin rash, fevers, chills, night sweats, weight loss, swollen lymph nodes, body aches, joint swelling, chest pain, shortness of breath, mood changes. POSITIVE muscle aches  Objective  Blood pressure 118/86, pulse 93, height 5' 6.5" (1.689 m), weight 173 lb (78.5 kg), SpO2 98 %.   General: Gilbert apparent distress alert and oriented x3 mood and affect normal, dressed appropriately.  HEENT: Pupils equal, extraocular movements intact  Respiratory: Patient's speak in full sentences and does not appear short of breath  Cardiovascular: Gilbert lower extremity edema, non tender, Gilbert erythema  Gait normal with good balance and coordination.  MSK:   Low back exam does  have some mild loss of lordosis.  Severely tender to palpation over the greater trochanteric area as well as over the sacroiliac joints bilaterally.  Negative straight leg test.  Neurovascular intact distally.  4+ out of 5 strength of the hip flexors bilaterally.    Impression and Recommendations:     The above documentation has been reviewed and is accurate and complete Lyndal Pulley, DO

## 2020-09-08 NOTE — Assessment & Plan Note (Signed)
Patient is having more low back pain that seems to radiate into the lateral aspects of the hips bilaterally.  Patient had had an MRI in 2017 that was fairly unremarkable.  These were independently visualized by me today with very mild narrowing of the spinal column but nothing significant.  Patient though has signs and symptoms that are consistent with more of a nerve root impingement versus possible chronic greater trochanteric bursitis.  Patient though has not responded as well to the injections previously.  Patient has failed home exercises and formal physical therapy.  Pain has been going on for greater than 11 years.  Do feel advanced imaging is warranted at this point with it now been nearly 5 years since last advanced imaging and patient could be a candidate for epidurals.  We will also get an MRI of the pelvis without contrast to further evaluate the hips and the sacroiliac joints to see if anything else is contributing.  Follow-up with me again after imaging to discuss.

## 2020-09-10 ENCOUNTER — Other Ambulatory Visit: Payer: Self-pay | Admitting: Family Medicine

## 2020-09-13 ENCOUNTER — Other Ambulatory Visit: Payer: Self-pay

## 2020-09-13 ENCOUNTER — Ambulatory Visit (INDEPENDENT_AMBULATORY_CARE_PROVIDER_SITE_OTHER): Payer: BC Managed Care – PPO

## 2020-09-13 DIAGNOSIS — R102 Pelvic and perineal pain: Secondary | ICD-10-CM | POA: Diagnosis not present

## 2020-09-13 DIAGNOSIS — M5126 Other intervertebral disc displacement, lumbar region: Secondary | ICD-10-CM | POA: Diagnosis not present

## 2020-09-13 DIAGNOSIS — M545 Low back pain, unspecified: Secondary | ICD-10-CM | POA: Diagnosis not present

## 2020-09-13 DIAGNOSIS — M5136 Other intervertebral disc degeneration, lumbar region: Secondary | ICD-10-CM | POA: Diagnosis not present

## 2020-09-13 DIAGNOSIS — M5416 Radiculopathy, lumbar region: Secondary | ICD-10-CM

## 2020-09-13 DIAGNOSIS — S76312A Strain of muscle, fascia and tendon of the posterior muscle group at thigh level, left thigh, initial encounter: Secondary | ICD-10-CM | POA: Diagnosis not present

## 2020-09-13 IMAGING — MR MR LUMBAR SPINE W/O CM
4 of 5 series · 26 of 48 positions shown · non-contrast
Comparison: Radiography [DATE].  MRI [DATE].

CLINICAL DATA: Low back and bilateral hip pain. Symptoms worsening
recently.

EXAM:
MRI LUMBAR SPINE WITHOUT CONTRAST
TECHNIQUE: Multiplanar, multisequence MR imaging of the lumbar spine was
performed. No intravenous contrast was administered.

[Series 6: T2 · sagittal · 4.0mm · 0.81mm/px · 6 of 17 slices shown (1 of 2)]
[im 1/17]
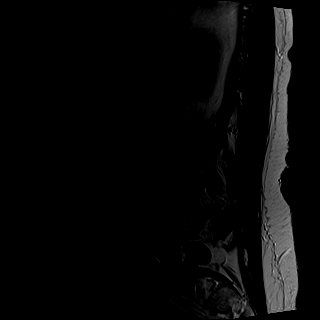
[im 4/17]
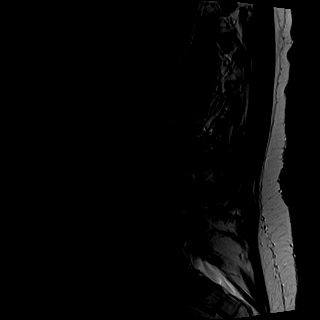
[im 7/17]
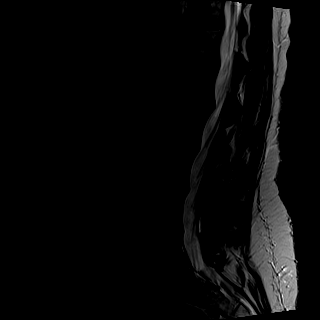
[im 10/17]
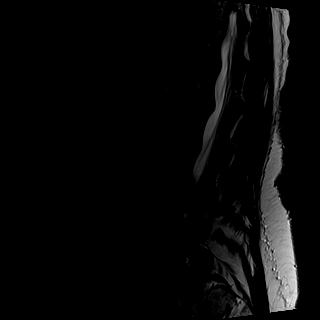
[im 13/17]
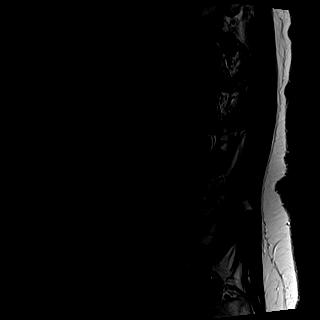
[im 17/17]
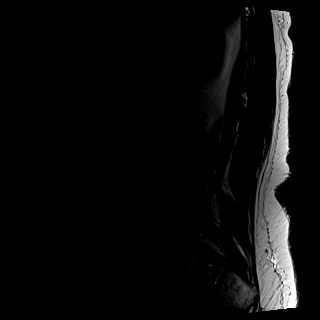

[Series 7: T1 · sagittal · 4.0mm · 0.41mm/px · 6 of 17 slices shown (1 of 2)]
[im 1/17]
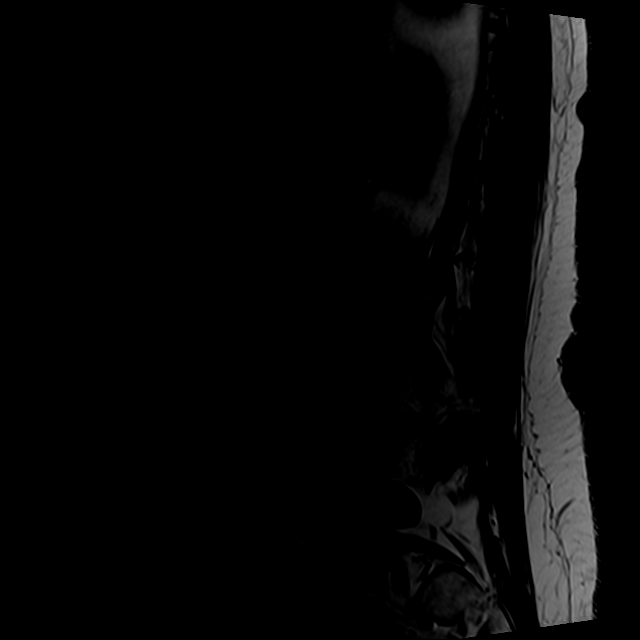
[im 4/17]
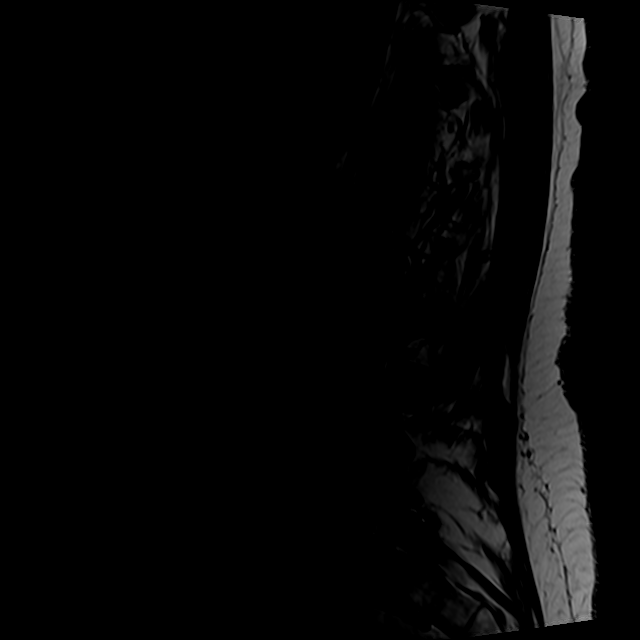
[im 7/17]
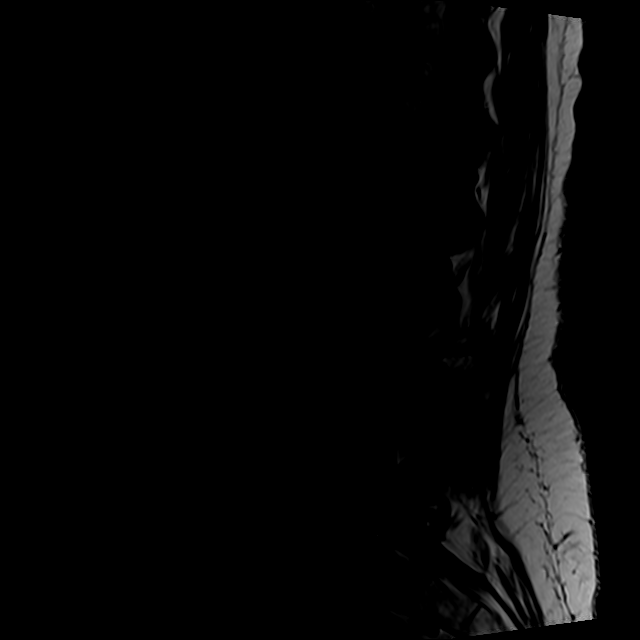
[im 10/17]
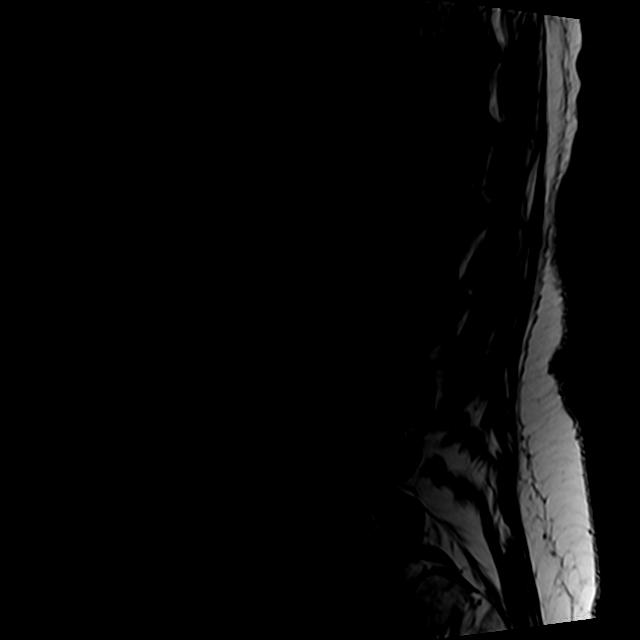
[im 13/17]
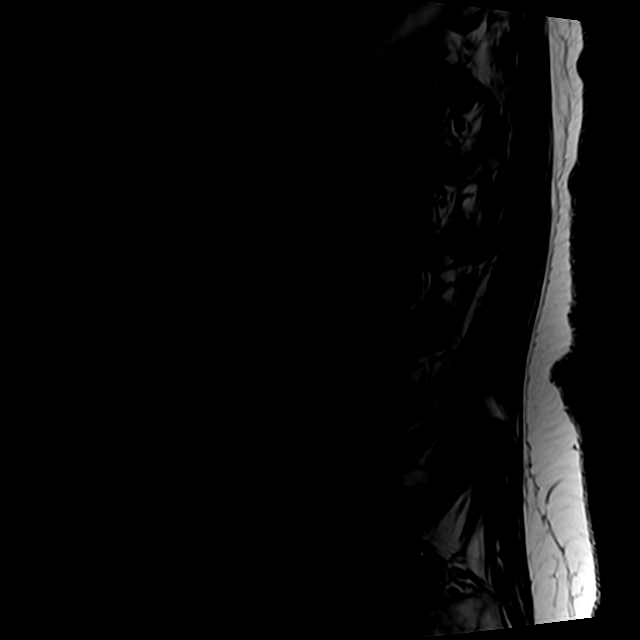
[im 17/17]
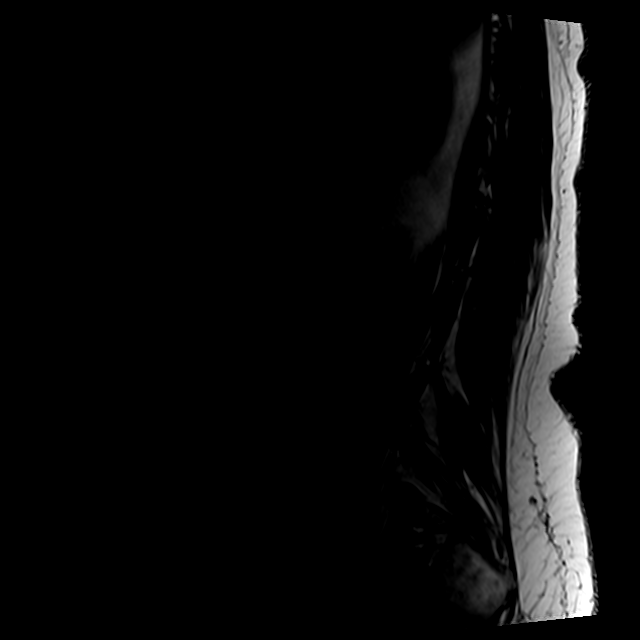

[Series 12: T2 · axial · 4.0mm · 0.78mm/px · z∈[-111,+131]mm · 9 of 41 slices shown (2 of 2)]
[im 1/41]
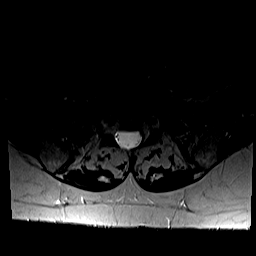
[im 6/41]
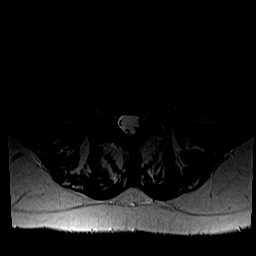
[im 12/41]
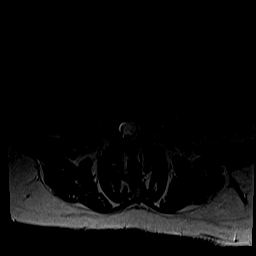
[im 18/41]
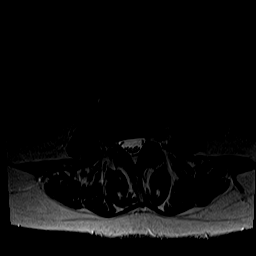
[im 21/41]
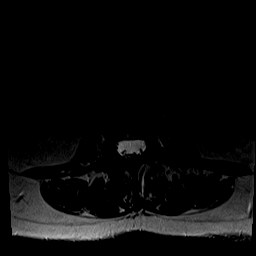
[im 23/41]
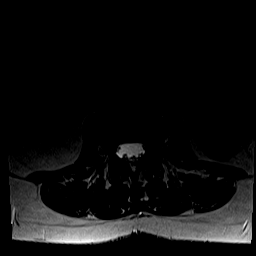
[im 29/41]
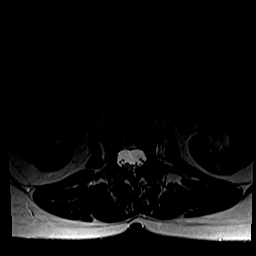
[im 35/41]
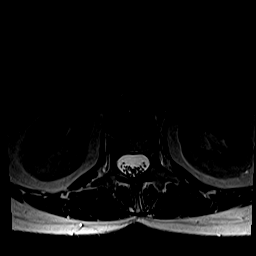
[im 41/41]
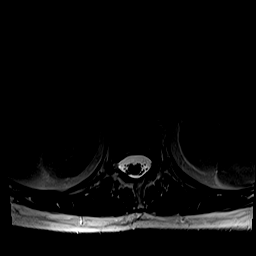

[Series 15: T1 · axial · 4.0mm · 0.39mm/px · z∈[-111,+101]mm · 5 of 41 slices shown (2 of 2)]
[im 1/41]
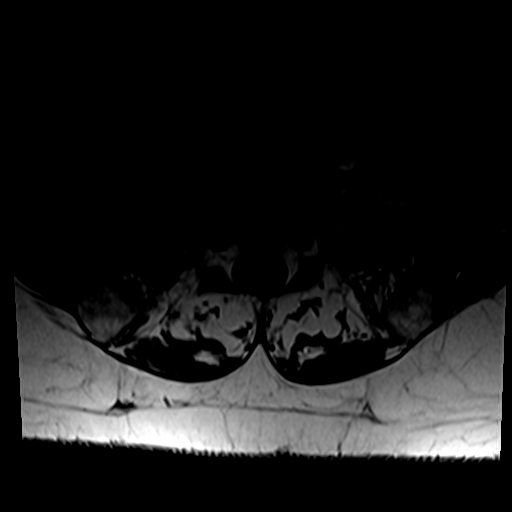
[im 6/41]
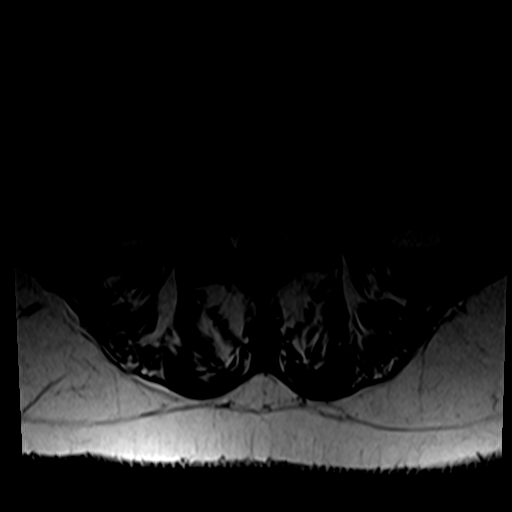
[im 12/41]
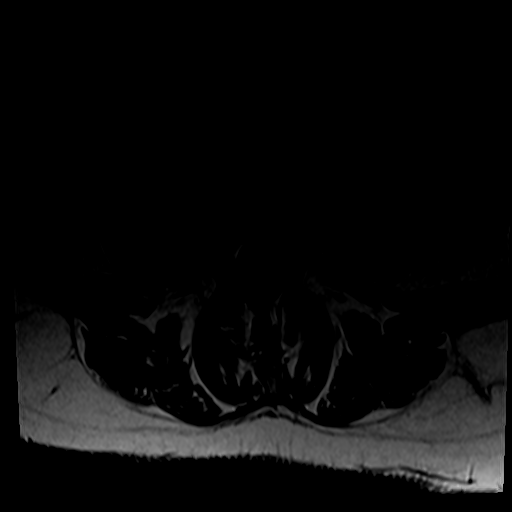
[im 21/41]
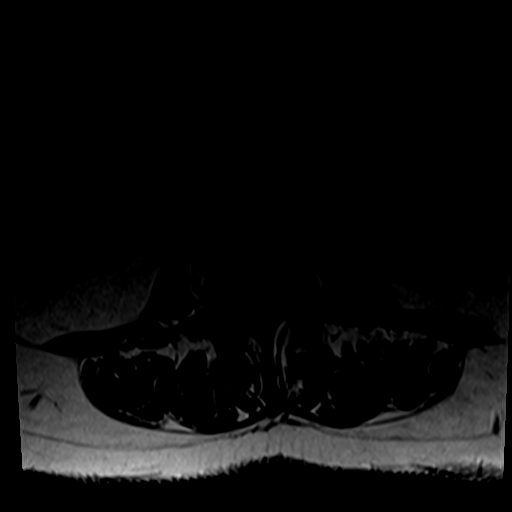
[im 35/41]
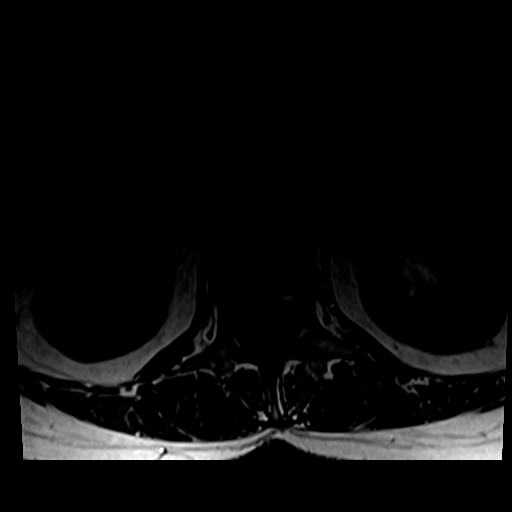

[26 of 48 positions shown; findings below may reference images not displayed]

FINDINGS: Segmentation:  5 lumbar type vertebral bodies.

Alignment: Mild scoliotic curvature convex to the right with the
apex at L2.

Vertebrae:  No fracture or primary bone lesion.

Conus medullaris and cauda equina: Conus extends to the L1 level.
Conus and cauda equina appear normal.

Paraspinal and other soft tissues: Negative.  Left renal cyst.

Disc levels:

T12-L1: Normal

L1-2: Desiccation and mild chronic bulging of the disc. No canal or
foraminal stenosis.

L2-3: Normal interspace.

L3-4: Desiccation and mild chronic bulging of the disc. No
compressive stenosis.

L4-5: Desiccation and minimal chronic bulging of the disc. No
stenosis. Minimal facet osteoarthritis.

L5-S1: Desiccation and minimal chronic bulging of the disc. No
stenosis.

No change or worsening since the previous study. The examination
does not show a clear explanation for the patient's described
symptoms.
IMPRESSION: Mild curvature convex to the right. Mild degenerative disc disease
from L1 to the L5-S1 level with mild disc bulges but no compressive
stenosis of the canal or foramina. Mild facet osteoarthritis at
L4-5. No appreciable worsening since [DATE].

## 2020-09-13 IMAGING — MR MR PELVIS W/O CM
6 series · 34 of 48 positions shown · non-contrast
Comparison: None.

CLINICAL DATA: Pelvic pain, question of stress fracture

EXAM:
MRI PELVIS WITHOUT CONTRAST
TECHNIQUE: Multiplanar multisequence MR imaging of the pelvis was performed. No
intravenous contrast was administered.

[Series 3: T1 · coronal · 5.0mm · 0.85mm/px · 6 of 30 slices shown (1 of 2)]
[im 1/30]
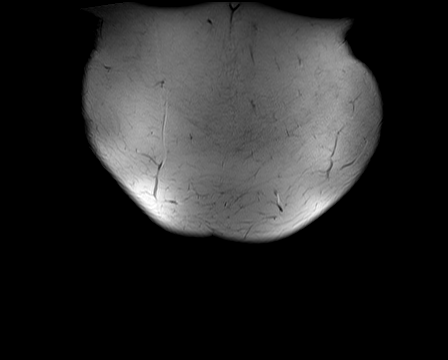
[im 6/30]
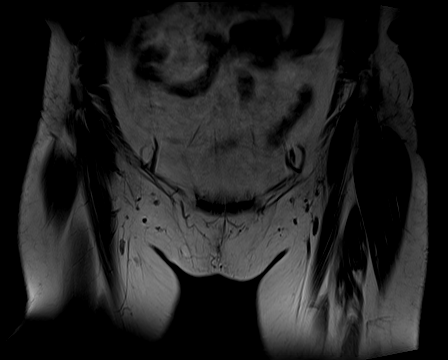
[im 12/30]
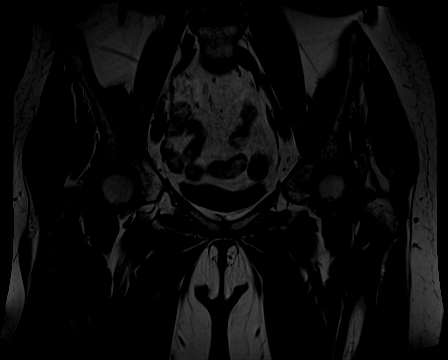
[im 18/30]
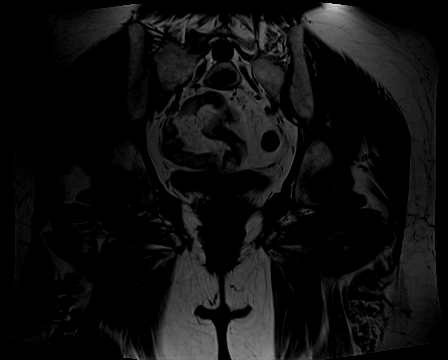
[im 24/30]
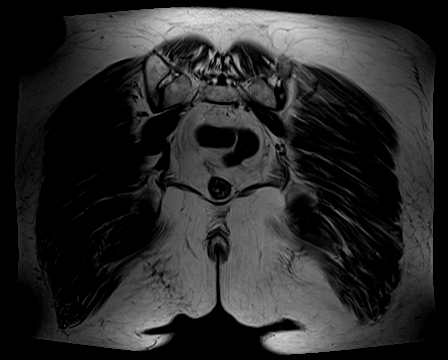
[im 30/30]
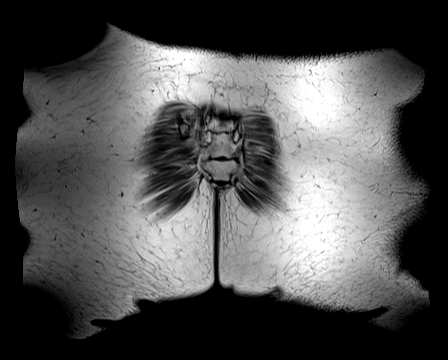

[Series 4: STIR · coronal · 5.0mm · 0.74mm/px · 6 of 30 slices shown (1 of 2)]
[im 1/30]
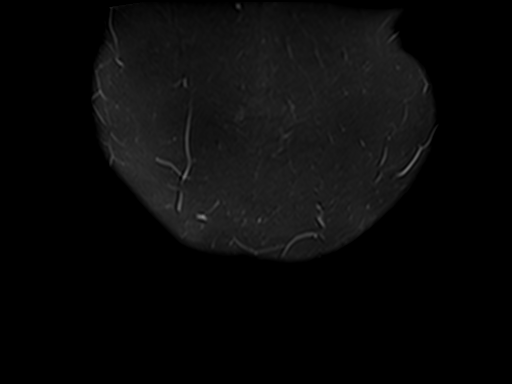
[im 6/30]
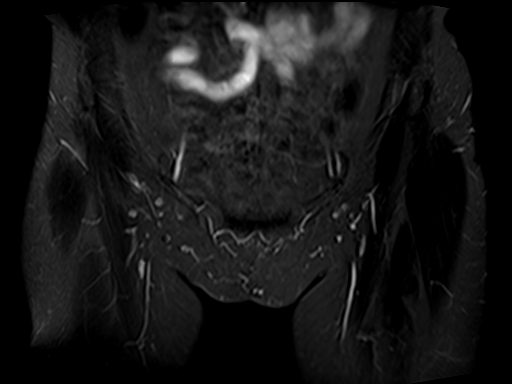
[im 12/30]
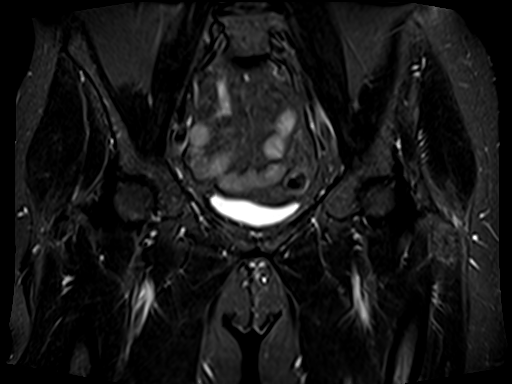
[im 18/30]
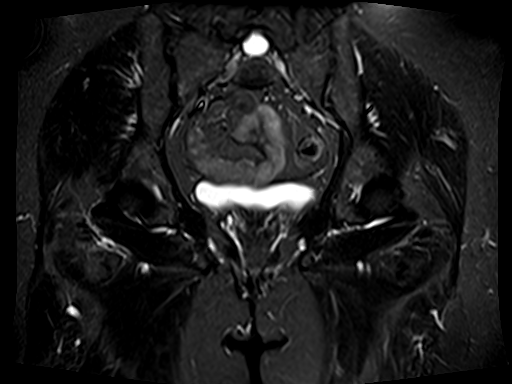
[im 24/30]
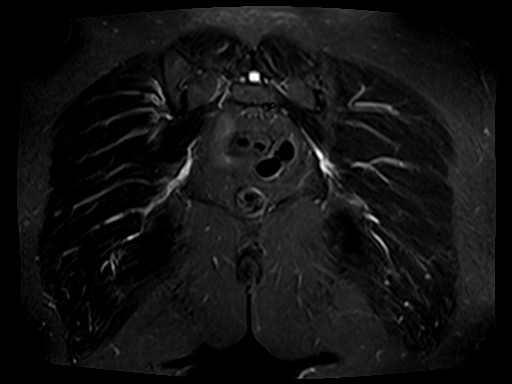
[im 30/30]
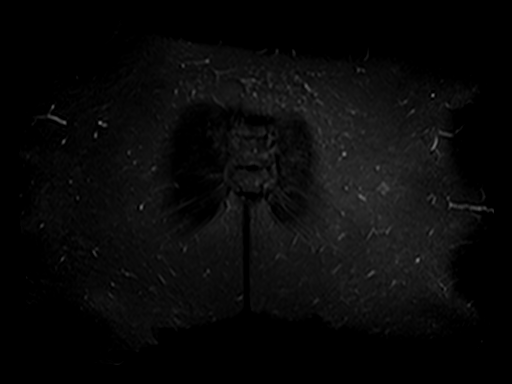

[Series 5: T1 · axial · 6.0mm · 0.74mm/px · z∈[-160,+105]mm · 6 of 35 slices shown (2 of 2)]
[im 1/35]
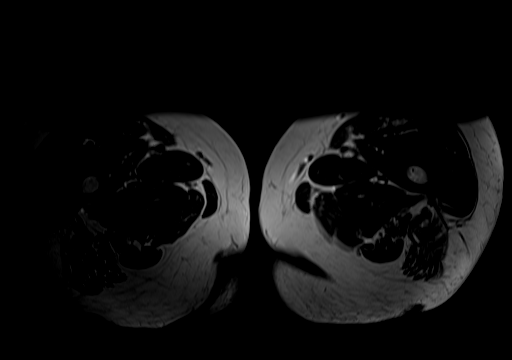
[im 7/35]
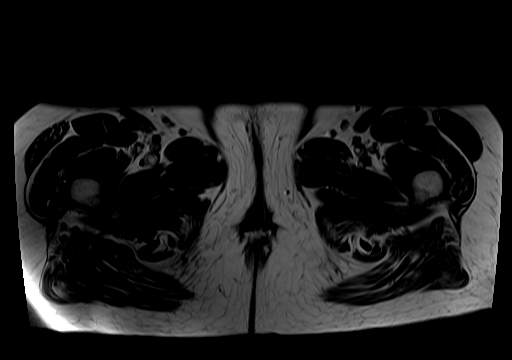
[im 14/35]
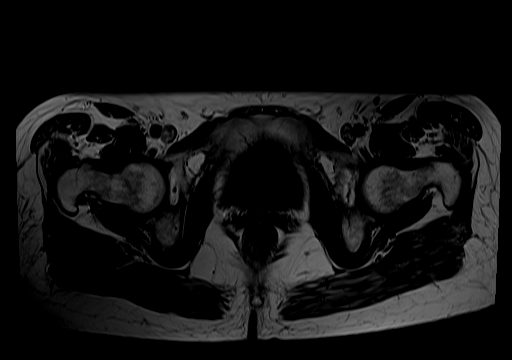
[im 21/35]
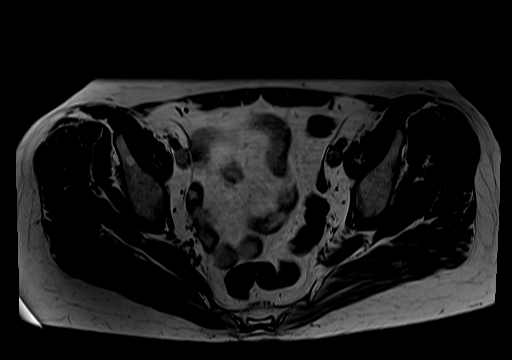
[im 28/35]
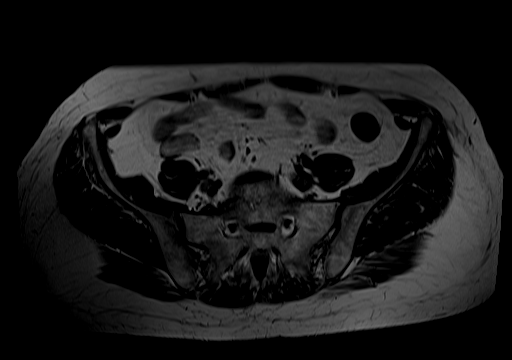
[im 35/35]
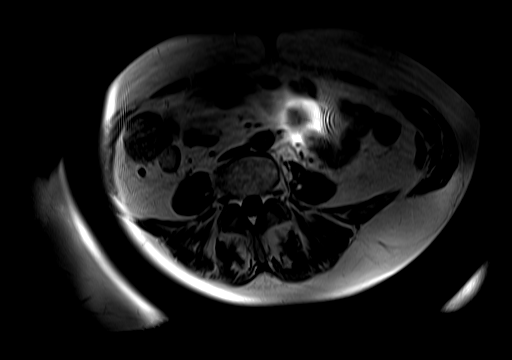

[Series 6: axial t2fs (pelvis) · axial · 6.0mm · 0.99mm/px · z∈[-160,+105]mm · 6 of 35 slices shown]
[im 1/35]
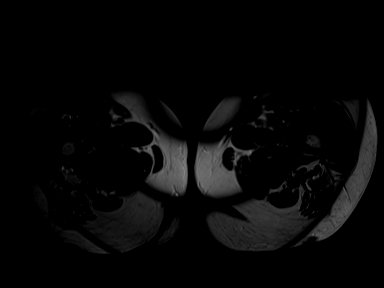
[im 7/35]
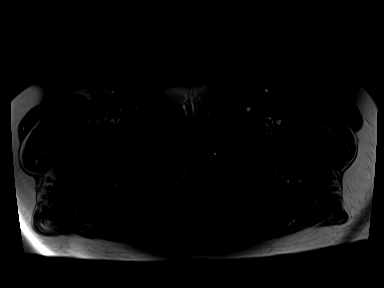
[im 14/35]
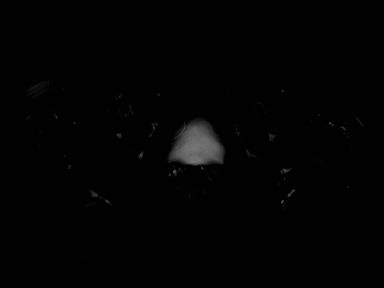
[im 21/35]
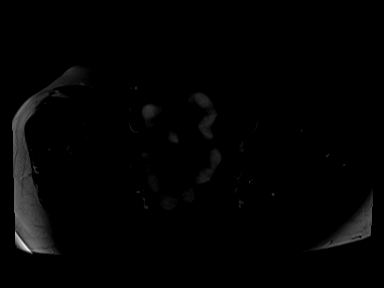
[im 28/35]
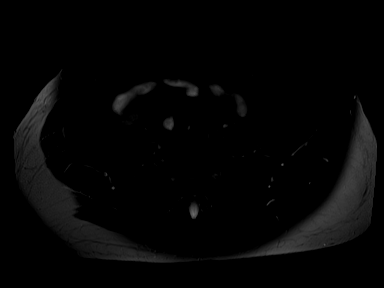
[im 35/35]
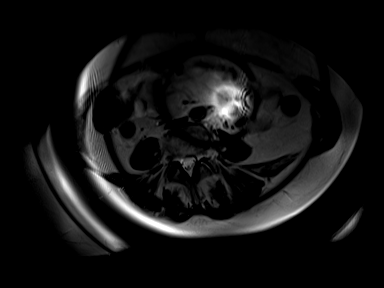

[Series 7: T2 fat-sat · sagittal · 4.0mm · 1.25mm/px · 8 of 66 slices shown]
[im 1/66]
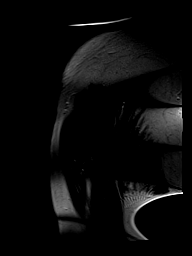
[im 12/66]
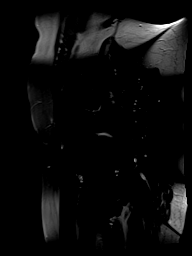
[im 18/66]
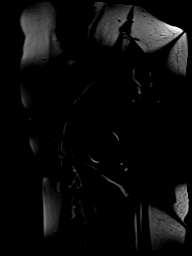
[im 30/66]
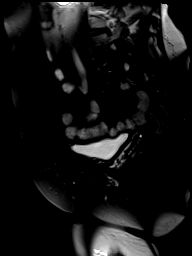
[im 36/66]
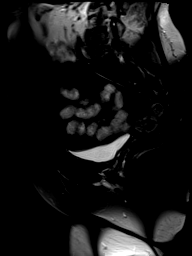
[im 48/66]
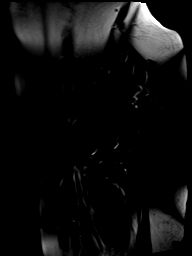
[im 54/66]
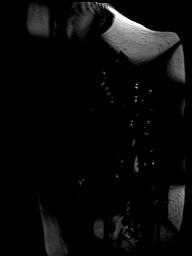
[im 66/66]
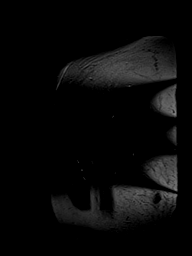

[Series 8: STIR · sagittal · 4.0mm · 0.62mm/px · 2 of 66 slices shown (2 of 2)]
[im 1/66]
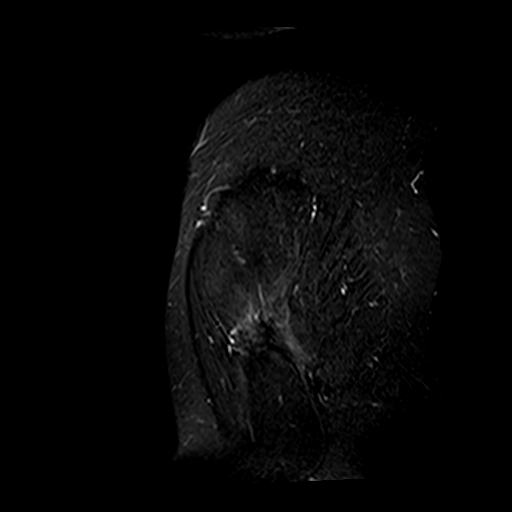
[im 12/66]
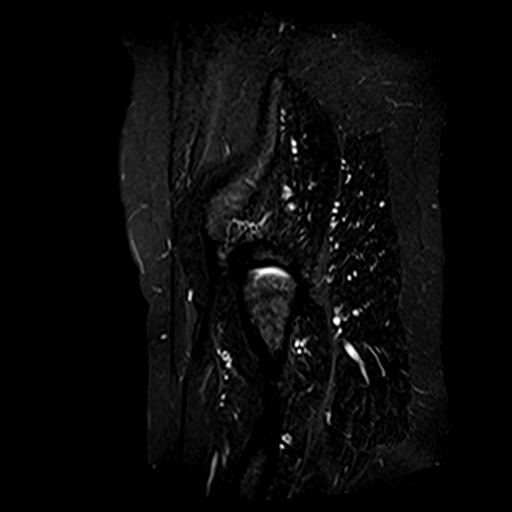

[34 of 48 positions shown; findings below may reference images not displayed]

FINDINGS: Urinary Tract: The visualized distal ureters and bladder appear
unremarkable.

Bowel: No bowel wall thickening, distention or surrounding
inflammation identified within the pelvis.

Vascular/Lymphatic: No enlarged pelvic lymph nodes identified. No
significant vascular findings.

Reproductive: The visualized deep pelvis is unremarkable.

Other: No focal soft tissue swelling is seen.

Musculoskeletal: There is a partial tear at the insertion site of
the gluteus minimus tendon at the left greater trochanter with
adjacent reactive marrow. There is also increased signal seen at the
insertion site of the gluteus medius tendon. The remainder of the
tendons surrounding the pelvis are intact. No osseous fracture or
pathologic marrow infiltration is seen. No large hip joint effusions
are noted.
IMPRESSION: Partial insertional left gluteus minimus tendon tear with adjacent
reactive marrow.

Insertional left gluteus medius tendinosis

No osseous fracture.

## 2020-09-14 ENCOUNTER — Encounter: Payer: Self-pay | Admitting: Family Medicine

## 2020-09-17 ENCOUNTER — Other Ambulatory Visit: Payer: Self-pay

## 2020-09-17 DIAGNOSIS — M7061 Trochanteric bursitis, right hip: Secondary | ICD-10-CM

## 2020-09-17 DIAGNOSIS — G8929 Other chronic pain: Secondary | ICD-10-CM

## 2020-09-28 ENCOUNTER — Other Ambulatory Visit: Payer: Self-pay | Admitting: *Deleted

## 2020-09-28 DIAGNOSIS — Z87891 Personal history of nicotine dependence: Secondary | ICD-10-CM

## 2020-10-05 DIAGNOSIS — M545 Low back pain, unspecified: Secondary | ICD-10-CM | POA: Diagnosis not present

## 2020-10-05 DIAGNOSIS — M25551 Pain in right hip: Secondary | ICD-10-CM | POA: Diagnosis not present

## 2020-10-05 DIAGNOSIS — M25552 Pain in left hip: Secondary | ICD-10-CM | POA: Diagnosis not present

## 2020-10-05 DIAGNOSIS — R269 Unspecified abnormalities of gait and mobility: Secondary | ICD-10-CM | POA: Diagnosis not present

## 2020-10-12 DIAGNOSIS — R269 Unspecified abnormalities of gait and mobility: Secondary | ICD-10-CM | POA: Diagnosis not present

## 2020-10-12 DIAGNOSIS — M25551 Pain in right hip: Secondary | ICD-10-CM | POA: Diagnosis not present

## 2020-10-12 DIAGNOSIS — M25552 Pain in left hip: Secondary | ICD-10-CM | POA: Diagnosis not present

## 2020-10-12 DIAGNOSIS — M545 Low back pain, unspecified: Secondary | ICD-10-CM | POA: Diagnosis not present

## 2020-10-12 DIAGNOSIS — Z20822 Contact with and (suspected) exposure to covid-19: Secondary | ICD-10-CM | POA: Diagnosis not present

## 2020-10-19 DIAGNOSIS — R269 Unspecified abnormalities of gait and mobility: Secondary | ICD-10-CM | POA: Diagnosis not present

## 2020-10-19 DIAGNOSIS — M545 Low back pain, unspecified: Secondary | ICD-10-CM | POA: Diagnosis not present

## 2020-10-19 DIAGNOSIS — M25551 Pain in right hip: Secondary | ICD-10-CM | POA: Diagnosis not present

## 2020-10-19 DIAGNOSIS — M25552 Pain in left hip: Secondary | ICD-10-CM | POA: Diagnosis not present

## 2020-10-27 DIAGNOSIS — M25552 Pain in left hip: Secondary | ICD-10-CM | POA: Diagnosis not present

## 2020-10-27 DIAGNOSIS — R269 Unspecified abnormalities of gait and mobility: Secondary | ICD-10-CM | POA: Diagnosis not present

## 2020-10-27 DIAGNOSIS — M25551 Pain in right hip: Secondary | ICD-10-CM | POA: Diagnosis not present

## 2020-10-27 DIAGNOSIS — M545 Low back pain, unspecified: Secondary | ICD-10-CM | POA: Diagnosis not present

## 2020-10-28 DIAGNOSIS — R6882 Decreased libido: Secondary | ICD-10-CM | POA: Diagnosis not present

## 2020-10-28 DIAGNOSIS — N951 Menopausal and female climacteric states: Secondary | ICD-10-CM | POA: Diagnosis not present

## 2020-10-28 DIAGNOSIS — E559 Vitamin D deficiency, unspecified: Secondary | ICD-10-CM | POA: Diagnosis not present

## 2020-10-28 DIAGNOSIS — R232 Flushing: Secondary | ICD-10-CM | POA: Diagnosis not present

## 2020-10-28 DIAGNOSIS — M2550 Pain in unspecified joint: Secondary | ICD-10-CM | POA: Diagnosis not present

## 2020-10-28 DIAGNOSIS — R5383 Other fatigue: Secondary | ICD-10-CM | POA: Diagnosis not present

## 2020-11-02 ENCOUNTER — Ambulatory Visit (INDEPENDENT_AMBULATORY_CARE_PROVIDER_SITE_OTHER): Payer: BC Managed Care – PPO | Admitting: Acute Care

## 2020-11-02 ENCOUNTER — Encounter: Payer: Self-pay | Admitting: Acute Care

## 2020-11-02 ENCOUNTER — Other Ambulatory Visit: Payer: Self-pay

## 2020-11-02 ENCOUNTER — Ambulatory Visit: Payer: BC Managed Care – PPO

## 2020-11-02 ENCOUNTER — Ambulatory Visit
Admission: RE | Admit: 2020-11-02 | Discharge: 2020-11-02 | Disposition: A | Discharge status: Home / Self Care | Payer: BC Managed Care – PPO | Source: Ambulatory Visit | Attending: Acute Care | Admitting: Acute Care

## 2020-11-02 VITALS — BP 122/84 | HR 88 | Temp 97.3°F | Ht 66.5 in | Wt 174.2 lb

## 2020-11-02 DIAGNOSIS — Z87891 Personal history of nicotine dependence: Secondary | ICD-10-CM | POA: Diagnosis not present

## 2020-11-02 DIAGNOSIS — F17210 Nicotine dependence, cigarettes, uncomplicated: Secondary | ICD-10-CM

## 2020-11-02 DIAGNOSIS — J8410 Pulmonary fibrosis, unspecified: Secondary | ICD-10-CM | POA: Diagnosis not present

## 2020-11-02 DIAGNOSIS — Z122 Encounter for screening for malignant neoplasm of respiratory organs: Secondary | ICD-10-CM

## 2020-11-02 IMAGING — CT CT CHEST LUNG CANCER SCREENING LOW DOSE W/O CM
1 of 2 series · 10 of 40 positions shown, 13 images · non-contrast
Comparison: None.

CLINICAL DATA: 60-year-old female with 32 pack-year history of
smoking. Lung cancer screening.

EXAM:
CT CHEST WITHOUT CONTRAST LOW-DOSE FOR LUNG CANCER SCREENING
TECHNIQUE: Multidetector CT imaging of the chest was performed following the
standard protocol without IV contrast.

[ct lung segmentation data · axial · 0.70mm/px · z∈[-372,-372]mm · 10 of 320 frames shown]
[frame 1/320  mediastinal]
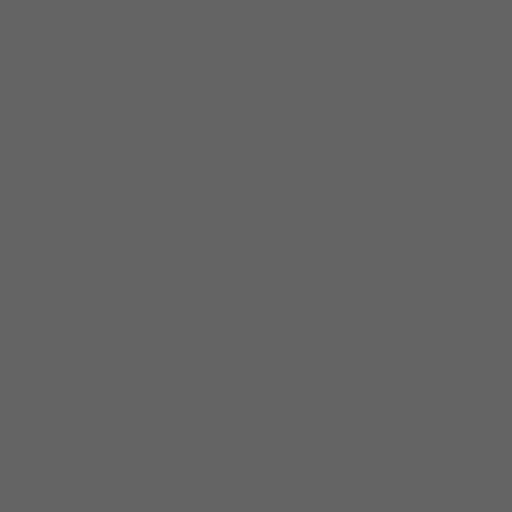
[frame 1/320  lung]
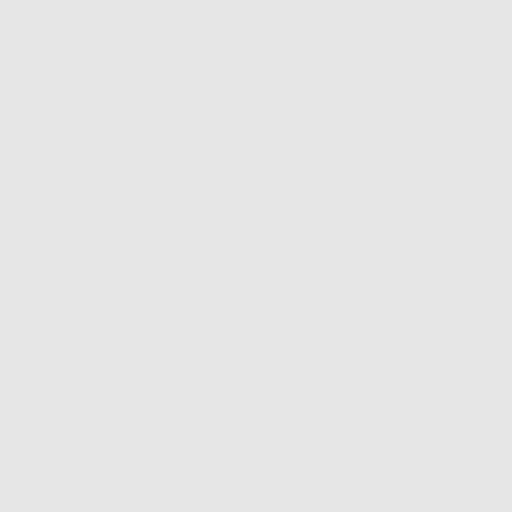
[frame 36/320  lung]
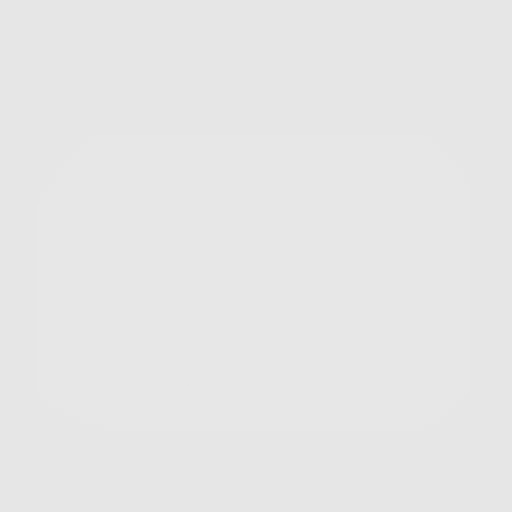
[frame 71/320  lung]
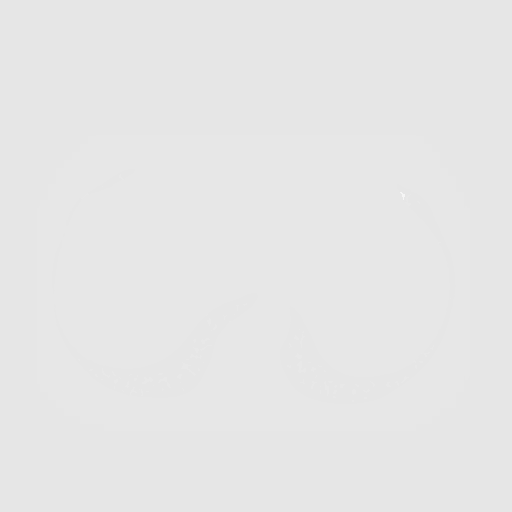
[frame 107/320  lung]
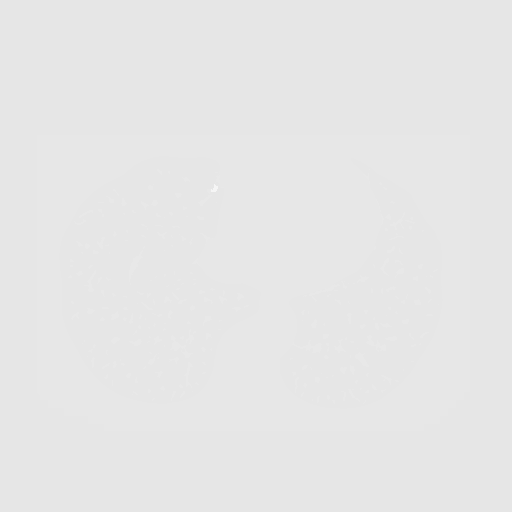
[frame 142/320  mediastinal]
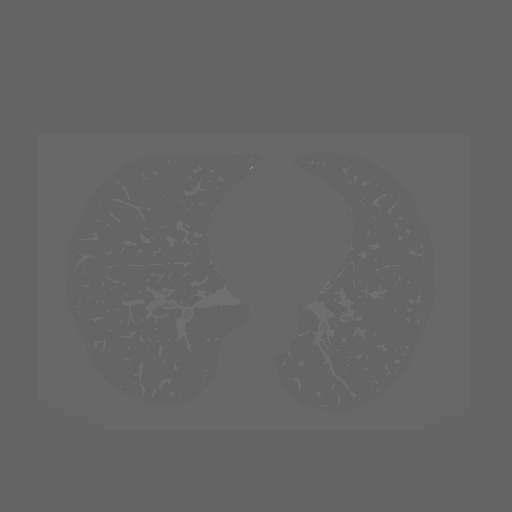
[frame 142/320  lung]
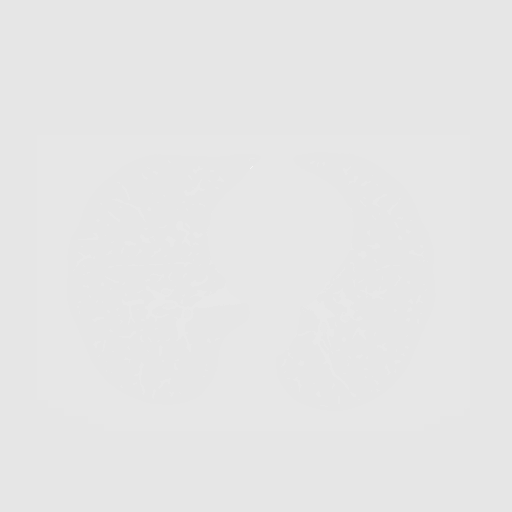
[frame 178/320  lung]
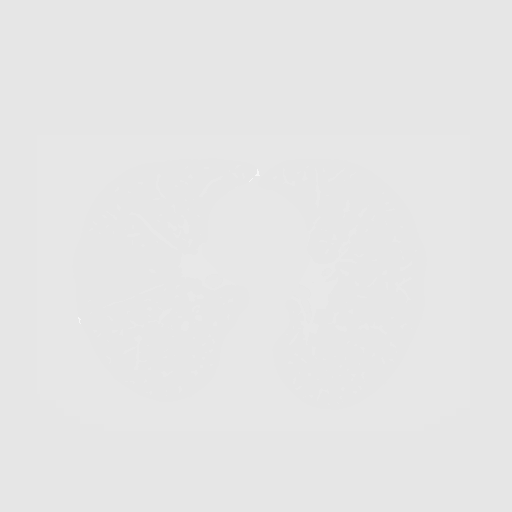
[frame 213/320  lung]
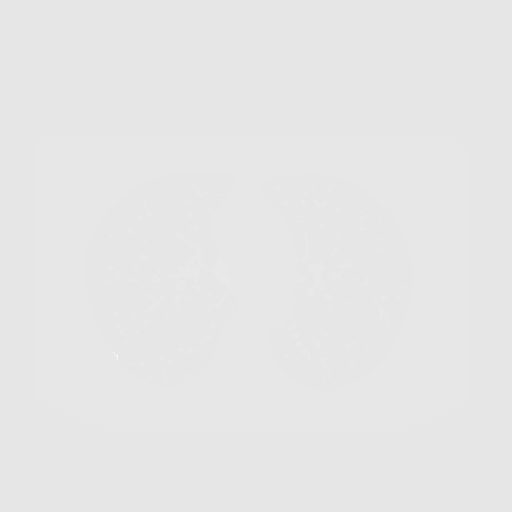
[frame 249/320  lung]
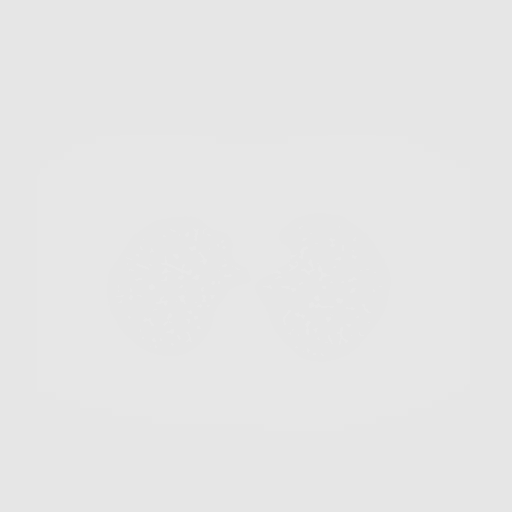
[frame 284/320  mediastinal]
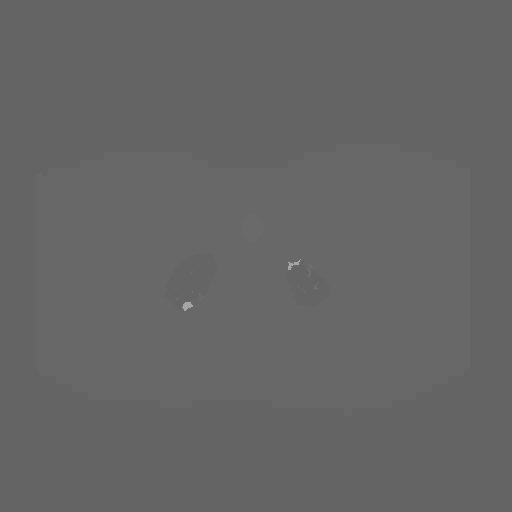
[frame 284/320  lung]
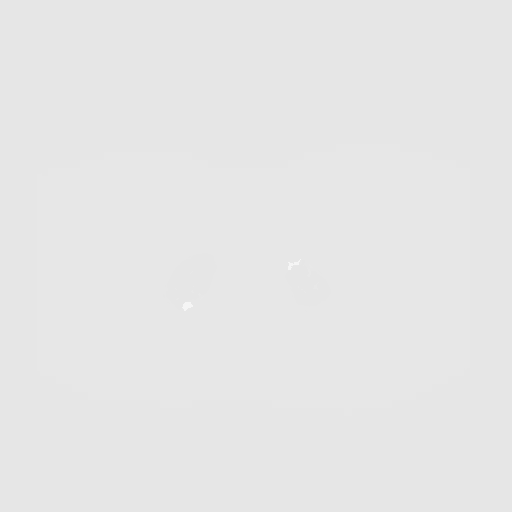
[frame 320/320  lung]
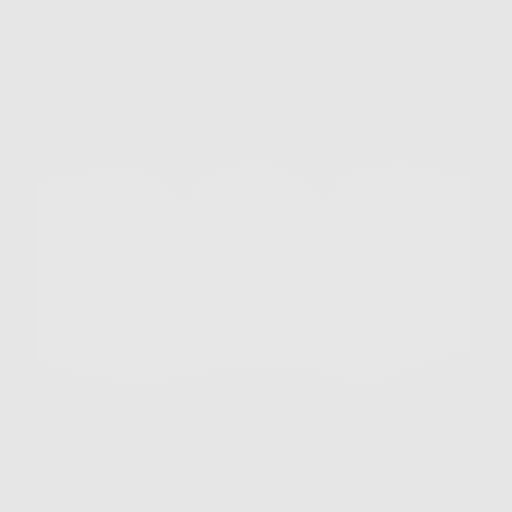

[10 of 40 positions shown; findings below may reference images not displayed]

FINDINGS: Cardiovascular: The heart size is normal. No substantial pericardial
effusion. No thoracic aortic aneurysm.

Mediastinum/Nodes: No mediastinal lymphadenopathy. No evidence for
gross hilar lymphadenopathy although assessment is limited by the
lack of intravenous contrast on today's study. The esophagus has
normal imaging features. There is no axillary lymphadenopathy.

Lungs/Pleura: Tiny calcified granuloma noted left upper lobe. No
suspicious pulmonary nodule or mass. No focal airspace
consolidation. No pleural effusion.

Upper Abdomen: 10 mm hypodensity in the lateral segment left liver
is too small to characterize but likely benign.

Musculoskeletal: No worrisome lytic or sclerotic osseous
abnormality.
IMPRESSION: 1. Lung-RADS 1, negative. Continue annual screening with low-dose
chest CT without contrast in 12 months.

## 2020-11-02 MED ORDER — SPIRIVA RESPIMAT 2.5 MCG/ACT IN AERS: 2.0000 | INHALATION_SPRAY | Freq: Every day | RESPIRATORY_TRACT | 0 refills | Status: DC

## 2020-11-02 NOTE — Progress Notes (Signed)
Shared Decision Making Visit Lung Cancer Screening Program [phone])   Eligibility:  Age 60 y.o.  Pack Years Smoking History Calculation 32 pack year smoking history (# packs/per year x # years smoked)  Recent History of coughing up blood  no  Unexplained weight loss? no ( >Than 15 pounds within the last 6 months )  Prior History Lung / other cancer no (Diagnosis within the last 5 years already requiring surveillance chest CT Scans).  Smoking Status Former Smoker  Former Smokers: Years since quit: < 1 year  Quit Date: 06/26/2020  Visit Components:  Discussion included one or more decision making aids. yes  Discussion included risk/benefits of screening. yes  Discussion included potential follow up diagnostic testing for abnormal scans. yes  Discussion included meaning and risk of over diagnosis. yes  Discussion included meaning and risk of False Positives. yes  Discussion included meaning of total radiation exposure. yes  Counseling Included:  Importance of adherence to annual lung cancer LDCT screening. yes  Impact of comorbidities on ability to participate in the program. yes  Ability and willingness to under diagnostic treatment. yes  Smoking Cessation Counseling:  Current Smokers:   Discussed importance of smoking cessation. yes  Information about tobacco cessation classes and interventions provided to patient. yes  Patient provided with "ticket" for LDCT Scan. yes  Symptomatic Patient. no  Counseling  Diagnosis Code: Tobacco Use Z72.0  Asymptomatic Patient yes  Counseling (Intermediate counseling: > three minutes counseling) E3154  Former Smokers:   Discussed the importance of maintaining cigarette abstinence. yes  Diagnosis Code: Personal History of Nicotine Dependence. M08.676  Information about tobacco cessation classes and interventions provided to patient. Yes  Patient provided with "ticket" for LDCT Scan. yes  Written Order for Lung  Cancer Screening with LDCT placed in Epic. Yes (CT Chest Lung Cancer Screening Low Dose W/O CM) [id number] Z12.2-Screening of respiratory organs Z87.891-Personal history of nicotine dependence  BP 122/84 (BP Location: Left Arm, Cuff Size: Normal)   Pulse 88   Temp (!) 97.3 F (36.3 C) (Oral)   Ht 5' 6.5" (1.689 m)   Wt 174 lb 3.2 oz (79 kg)   SpO2 99%   BMI 27.70 kg/m    I spent 25 minutes of face to face time with Kelly Gilbert discussing the risks and benefits of lung cancer screening. We viewed a power point together that explained in detail the above noted topics. We took the time to pause the power point at intervals to allow for questions to be asked and answered to ensure understanding. We discussed that she had taken the single most powerful action possible to decrease her risk of developing lung cancer when she quit smoking. I counseled her to remain smoke free, and to contact me if she ever had the desire to smoke again so that I can provide resources and tools to help support the effort to remain smoke free. We discussed the time and location of the scan, and that either  Doroteo Glassman RN or I will call with the results within  24-48 hours of receiving them. She has my card and contact information in the event she needs to speak with me, in addition to a copy of the power point we reviewed as a resource. She verbalized understanding of all of the above and had no further questions upon leaving the office.     I explained to the patient that there has been a high incidence of coronary artery disease noted on these exams.  I explained that this is a non-gated exam therefore degree or severity cannot be determined. This patient is not on statin therapy. I have asked the patient to follow-up with their PCP regarding any incidental finding of coronary artery disease and management with diet or medication as they feel is clinically indicated. The patient verbalized understanding of the above and  had no further questions.     Kelly Spatz, NP 11/02/2020 3:55 PM

## 2020-11-02 NOTE — Patient Instructions (Signed)
Thank you for participating in the [facility]. It was our pleasure to meet you today. We will call you with the results of your scan within the next few days. Your scan will be assigned a Lung RADS category score by the physicians reading the scans.  This Lung RADS score determines follow up scanning.  See below for description of categories, and follow up screening recommendations. We will be in touch to schedule your follow up screening annually or based on recommendations of our providers. We will fax a copy of your scan results to your Primary Care Physician, or the physician who referred you to the program, to ensure they have the results. Please call the office if you have any questions or concerns regarding your scanning experience or results.  Our office number is [phone]. Please speak with Denise Phelps, RN. She is our Lung Cancer Screening RN. If she is unavailable when you call, please have the office staff send her a message. She will return your call at her earliest convenience. Remember, if your scan is normal, we will scan you annually as long as you continue to meet the criteria for the program. (Age 55-77, Current smoker or smoker who has quit within the last 15 years). If you are a smoker, remember, quitting is the single most powerful action that you can take to decrease your risk of lung cancer and other pulmonary, breathing related problems. We know quitting is hard, and we are here to help.  Please let us know if there is anything we can do to help you meet your goal of quitting. If you are a former smoker, congratulations. We are proud of you! Remain smoke free! Remember you can refer friends or family members through the number above.  We will screen them to make sure they meet criteria for the program. Thank you for helping us take better care of you by participating in Lung Screening.  Lung RADS Categories:  Lung RADS 1: no nodules  or definitely non-concerning nodules.  Recommendation is for a repeat annual scan in 12 months.  Lung RADS 2:  nodules that are non-concerning in appearance and behavior with a very low likelihood of becoming an active cancer. Recommendation is for a repeat annual scan in 12 months.  Lung RADS 3: nodules that are probably non-concerning , includes nodules with a low likelihood of becoming an active cancer.  Recommendation is for a 6-month repeat screening scan. Often noted after an upper respiratory illness. We will be in touch to make sure you have no questions, and to schedule your 6-month scan.  Lung RADS 4 A: nodules with concerning findings, recommendation is most often for a follow up scan in 3 months or additional testing based on our provider's assessment of the scan. We will be in touch to make sure you have no questions and to schedule the recommended 3 month follow up scan.  Lung RADS 4 B:  indicates findings that are concerning. We will be in touch with you to schedule additional diagnostic testing based on our provider's  assessment of the scan.   

## 2020-11-03 DIAGNOSIS — M545 Low back pain, unspecified: Secondary | ICD-10-CM | POA: Diagnosis not present

## 2020-11-03 DIAGNOSIS — R269 Unspecified abnormalities of gait and mobility: Secondary | ICD-10-CM | POA: Diagnosis not present

## 2020-11-03 DIAGNOSIS — M25551 Pain in right hip: Secondary | ICD-10-CM | POA: Diagnosis not present

## 2020-11-03 DIAGNOSIS — M25552 Pain in left hip: Secondary | ICD-10-CM | POA: Diagnosis not present

## 2020-11-03 NOTE — Progress Notes (Signed)
Please call patient and let them  know their  low dose Ct was read as a Lung RADS 1, negative study: no nodules or definitely benign nodules. Radiology recommendation is for a repeat LDCT in 12 months. .Please let them  know we will order and schedule their  annual screening scan for 10/2021.  Please place order for annual  screening scan for  10/2021 and fax results to PCP. Thanks so much.

## 2020-11-04 ENCOUNTER — Other Ambulatory Visit: Payer: Self-pay | Admitting: *Deleted

## 2020-11-04 DIAGNOSIS — Z87891 Personal history of nicotine dependence: Secondary | ICD-10-CM

## 2020-11-09 DIAGNOSIS — R269 Unspecified abnormalities of gait and mobility: Secondary | ICD-10-CM | POA: Diagnosis not present

## 2020-11-09 DIAGNOSIS — M25551 Pain in right hip: Secondary | ICD-10-CM | POA: Diagnosis not present

## 2020-11-09 DIAGNOSIS — M25552 Pain in left hip: Secondary | ICD-10-CM | POA: Diagnosis not present

## 2020-11-09 DIAGNOSIS — M545 Low back pain, unspecified: Secondary | ICD-10-CM | POA: Diagnosis not present

## 2020-11-11 DIAGNOSIS — R030 Elevated blood-pressure reading, without diagnosis of hypertension: Secondary | ICD-10-CM | POA: Diagnosis not present

## 2020-11-11 DIAGNOSIS — E349 Endocrine disorder, unspecified: Secondary | ICD-10-CM | POA: Diagnosis not present

## 2020-11-11 DIAGNOSIS — R5383 Other fatigue: Secondary | ICD-10-CM | POA: Diagnosis not present

## 2020-11-11 DIAGNOSIS — E60 Dietary zinc deficiency: Secondary | ICD-10-CM | POA: Diagnosis not present

## 2021-01-20 DIAGNOSIS — R030 Elevated blood-pressure reading, without diagnosis of hypertension: Secondary | ICD-10-CM | POA: Diagnosis not present

## 2021-01-20 DIAGNOSIS — R5383 Other fatigue: Secondary | ICD-10-CM | POA: Diagnosis not present

## 2021-01-20 DIAGNOSIS — E349 Endocrine disorder, unspecified: Secondary | ICD-10-CM | POA: Diagnosis not present

## 2021-01-20 DIAGNOSIS — E559 Vitamin D deficiency, unspecified: Secondary | ICD-10-CM | POA: Diagnosis not present

## 2021-01-20 DIAGNOSIS — E60 Dietary zinc deficiency: Secondary | ICD-10-CM | POA: Diagnosis not present

## 2021-02-03 DIAGNOSIS — E349 Endocrine disorder, unspecified: Secondary | ICD-10-CM | POA: Diagnosis not present

## 2021-02-03 DIAGNOSIS — R030 Elevated blood-pressure reading, without diagnosis of hypertension: Secondary | ICD-10-CM | POA: Diagnosis not present

## 2021-02-03 DIAGNOSIS — R5383 Other fatigue: Secondary | ICD-10-CM | POA: Diagnosis not present

## 2021-02-03 DIAGNOSIS — E60 Dietary zinc deficiency: Secondary | ICD-10-CM | POA: Diagnosis not present

## 2021-04-17 ENCOUNTER — Other Ambulatory Visit: Payer: Self-pay | Admitting: Family Medicine

## 2021-04-27 ENCOUNTER — Other Ambulatory Visit: Payer: Self-pay | Admitting: Family Medicine

## 2021-04-28 ENCOUNTER — Telehealth: Payer: Self-pay | Admitting: Family Medicine

## 2021-04-28 NOTE — Telephone Encounter (Signed)
TELEPHONE ENCOUNTER PRIMARY CARE PROVIDER: Eulas Post, MD  REASON FOR CALL: Pt is  request refill  on the imipramine (TOFRANIL) 25 MG tablet per pharmacist states pt is going out of town  [facility] # [place], [place]  [place] Fax:  [phone]  LAST VISIT IN THIS CLINIC:  04/27/2021 NEXT APPOINTMENT SCHEDULED IN THIS CLINIC: Visit date not found

## 2021-04-28 NOTE — Telephone Encounter (Signed)
Rx sent to pt's pharmacy

## 2021-05-18 ENCOUNTER — Ambulatory Visit: Payer: BC Managed Care – PPO | Admitting: Family Medicine

## 2021-05-18 ENCOUNTER — Other Ambulatory Visit: Payer: Self-pay

## 2021-05-18 VITALS — BP 124/68 | HR 75 | Temp 98.6°F | Wt 167.6 lb

## 2021-05-18 DIAGNOSIS — R519 Headache, unspecified: Secondary | ICD-10-CM

## 2021-05-18 DIAGNOSIS — Z23 Encounter for immunization: Secondary | ICD-10-CM

## 2021-05-18 DIAGNOSIS — G8929 Other chronic pain: Secondary | ICD-10-CM | POA: Diagnosis not present

## 2021-05-18 MED ORDER — IMIPRAMINE HCL 25 MG PO TABS
ORAL_TABLET | ORAL | 3 refills | Status: DC
Start: 2021-05-18 — End: 2022-06-14

## 2021-05-18 NOTE — Progress Notes (Signed)
Established Patient Office Visit  Subjective:  Patient ID: Kelly Gilbert, female    DOB: 10/29/1960  Age: 60 y.o. MRN: [id number]  CC:  Chief Complaint  Patient presents with   Medication Refill    HPI Kelly Gilbert presents for request for medication refill.  She has history of chronic headaches and has seen neurologist in the past.  Her headaches are mostly frontal somewhat bilateral.  Occasionally throbbing.  No nausea or vomiting.  Does have some light sensitivity.  Has used imipramine 25 mg at night which does greatly blunt the severity of her headaches.  She has been recently taking daily Excedrin.  She knows not to take daily analgesics.  She thinks she has tried several other prophylactics in the past but cannot recall specifically which ones.  She has recently lost some weight during the past year due to her efforts through weight watchers.  No chronic sinusitis symptoms.  Past Medical History:  Diagnosis Date   Arthritis    GERD (gastroesophageal reflux disease)    Lesion of bladder    Migraine headache    PONV (postoperative nausea and vomiting)     Past Surgical History:  Procedure Laterality Date   BENIGN LEFT BREASET BX  1992   COLONOSCOPY     CYSTOSCOPY WITH BIOPSY  01/16/2012   Procedure: CYSTOSCOPY WITH BIOPSY;  Surgeon: Molli Hazard, MD;  Location: [facility];  Service: Urology;  Laterality: N/A;  1 hour requested for this case  [place].    Family History  Problem Relation Age of Onset   Hypertension Father    Cancer Father        prostate   Hypertension Maternal Grandmother    Cancer Mother 62       renal cell cancer   Colon cancer Neg Hx    Pancreatic cancer Neg Hx    Stomach cancer Neg Hx    Esophageal cancer Neg Hx     Social History   Socioeconomic History   Marital status: Married    Spouse name: Not on file   Number of children: 2   Years of education:  Not on file   Highest education level: Not on file  Occupational History    Comment: Realtor  Tobacco Use   Smoking status: Former    Packs/day: 0.25    Years: 35.00    Pack years: 8.75    Types: Cigarettes    Quit date: 06/26/2020    Years since quitting: 0.8   Smokeless tobacco: Never   Tobacco comments:    QUIT SMOKING 2008--  RECENTLY STARTED BACK 7-8 CIG. PER DAY  Vaping Use   Vaping Use: Never used  Substance and Sexual Activity   Alcohol use: Yes    Alcohol/week: 2.0 standard drinks    Types: 2 Shots of liquor per week    Comment: Occasional   Drug use: Never   Sexual activity: Not on file  Other Topics Concern   Not on file  Social History Narrative   Not on file   Social Determinants of Health   Financial Resource Strain: Not on file  Food Insecurity: Not on file  Transportation Needs: Not on file  Physical Activity: Not on file  Stress: Not on file  Social Connections: Not on file  Intimate Partner Violence: Not on file    Outpatient Medications Prior to Visit  Medication Sig  Dispense Refill   amphetamine-dextroamphetamine (ADDERALL) 15 MG tablet Take 1 tablet by mouth 2 (two) times daily. May refill in one month 60 tablet 0   Aspirin-Acetaminophen-Caffeine (EXCEDRIN PO) Take 1 tablet by mouth daily as needed (headache).      Biotin 5000 MCG CAPS Take 1 capsule by mouth every evening.      Boswellia Serrata (BOSWELLIA PO) Take by mouth.     Calcium Carb-Cholecalciferol (CALCIUM 600 + D PO) Take by mouth daily.     cholecalciferol (VITAMIN D3) 25 MCG (1000 UNIT) tablet Take 1,000 Units by mouth daily.     co-enzyme Q-10 30 MG capsule Take 30 mg by mouth 3 (three) times daily.     Estradiol-Progesterone 1-100 MG CAPS Take by mouth.     L-LYSINE PO Take 1,000 mg by mouth daily.     magnesium 30 MG tablet Take 30 mg by mouth daily.     meclizine (ANTIVERT) 25 MG tablet Take 1 tablet (25 mg total) by mouth 3 (three) times daily as needed for dizziness. 30  tablet 0   meloxicam (MOBIC) 15 MG tablet Take 1 tablet (15 mg total) by mouth daily. 30 tablet 0   Multiple Vitamin (MULTIVITAMIN PO) Take 1 capsule by mouth daily.      NON FORMULARY Doterra- alpha CRS and Cellular Vitality complex- 2 daily     NON FORMULARY Doterra Micro Plex VMz Food Nutrient complex- 2 daily     NON FORMULARY Doterra- EO Mega essential oil omega complex-  2 daily      omeprazole (PRILOSEC) 20 MG capsule Take 20 mg by mouth daily.     PENNSAID 2 % SOLN APPLY TWO PUMPS (40MG ) TOPICALLY TO AFFECTED AREA(S) TWICE A DAY 112 g 2   predniSONE (DELTASONE) 20 MG tablet Take 2 tablets (40 mg total) by mouth daily with breakfast. 10 tablet 0   Tiotropium Bromide Monohydrate (SPIRIVA RESPIMAT) 2.5 MCG/ACT AERS Inhale 2 puffs into the lungs daily. 4 g 0   triamcinolone cream (KENALOG) 0.1 % Apply 1 application topically 2 (two) times daily. For 1-2 weeks. (Patient taking differently: Apply 1 application topically 2 (two) times daily as needed (rash).) 45 g 0   TURMERIC PO Take 2 capsules by mouth.     vitamin C (ASCORBIC ACID) 250 MG tablet Take 500 mg by mouth daily.     gabapentin (NEURONTIN) 100 MG capsule Take 2 capsules (200 mg total) by mouth at bedtime. 180 capsule 0   gabapentin (NEURONTIN) 100 MG capsule Take 2 capsules (200 mg total) by mouth at bedtime. 180 capsule 3   imipramine (TOFRANIL) 25 MG tablet TAKE ONE TABLET BY MOUTH DAILY AT BEDTIME *office visit needed for further refills* 30 tablet 0   Facility-Administered Medications Prior to Visit  Medication Dose Route Frequency Provider Last Rate Last Admin   0.9 %  sodium chloride infusion  500 mL Intravenous Once Pyrtle, Lajuan Lines, MD        Allergies  Allergen Reactions   Codeine Sulfate Nausea And Vomiting   Sulfa Antibiotics Hives and Swelling    ROS Review of Systems  Constitutional:  Negative for chills and fever.  Neurological:  Positive for headaches. Negative for seizures, syncope, speech difficulty and  weakness.  Psychiatric/Behavioral:  Negative for confusion.      Objective:    Physical Exam Vitals reviewed.  Constitutional:      Appearance: Normal appearance.  Cardiovascular:     Rate and Rhythm: Normal rate and regular  rhythm.  Pulmonary:     Effort: Pulmonary effort is normal.     Breath sounds: Normal breath sounds.  Musculoskeletal:     Cervical back: Neck supple.  Lymphadenopathy:     Cervical: No cervical adenopathy.  Neurological:     General: No focal deficit present.     Mental Status: She is alert.     Cranial Nerves: No cranial nerve deficit.     Motor: No weakness.    BP 124/68 (BP Location: Left Arm, Patient Position: Sitting, Cuff Size: Normal)   Pulse 75   Temp 98.6 F (37 C) (Oral)   Wt 167 lb 9.6 oz (76 kg)   SpO2 99%   BMI 26.65 kg/m  Wt Readings from Last 3 Encounters:  05/18/21 167 lb 9.6 oz (76 kg)  11/02/20 174 lb 3.2 oz (79 kg)  09/08/20 173 lb (78.5 kg)     Health Maintenance Due  Topic Date Due   HIV Screening  Never done   Hepatitis C Screening  Never done   Zoster Vaccines- Shingrix (1 of 2) Never done   MAMMOGRAM  05/13/2016   COVID-19 Vaccine (3 - Booster for Janssen series) 10/16/2020    There are no preventive care reminders to display for this patient.  Lab Results  Component Value Date   TSH 0.93 12/18/2015   Lab Results  Component Value Date   WBC 7.1 09/24/2019   HGB 12.6 09/24/2019   HCT 39.3 09/24/2019   MCV 90.6 09/24/2019   PLT 343 09/24/2019   Lab Results  Component Value Date   NA 142 09/24/2019   K 3.9 09/24/2019   CO2 28 09/24/2019   GLUCOSE 93 09/24/2019   BUN 15 09/24/2019   CREATININE 0.79 09/24/2019   BILITOT 0.2 12/18/2015   ALKPHOS 60 12/18/2015   AST 12 12/18/2015   ALT 11 12/18/2015   PROT 6.4 12/18/2015   ALBUMIN 4.2 12/18/2015   CALCIUM 9.4 09/24/2019   ANIONGAP 8 09/24/2019   GFR 94.52 12/18/2015   Lab Results  Component Value Date   CHOL 206 (H) 12/18/2015   Lab Results   Component Value Date   HDL 62.30 12/18/2015   Lab Results  Component Value Date   LDLCALC 125 (H) 12/18/2015   Lab Results  Component Value Date   TRIG 94.0 12/18/2015   Lab Results  Component Value Date   CHOLHDL 3 12/18/2015   No results found for: HGBA1C    Assessment & Plan:   Problem List Items Addressed This Visit       Unprioritized   Chronic headaches   Relevant Medications   imipramine (TOFRANIL) 25 MG tablet   Other Visit Diagnoses     Need for immunization against influenza    -  Primary   Relevant Orders   Flu Vaccine QUAD 91mo+IM (Fluarix, Fluzone & Alfiuria Quad PF) (Completed)     Patient has chronic headaches which may be of mixed type.  She has some components of chronic tension type headache and also some components of possible migraine.  She has done generally fairly well with imipramine in the past.  This was refilled.  She cannot recall other specific prophylactic medications she has tried in the past but she states she tried many  -Flu vaccine given  Meds ordered this encounter  Medications   imipramine (TOFRANIL) 25 MG tablet    Sig: TAKE ONE TABLET BY MOUTH DAILY AT BEDTIME *office visit needed for further refills*  Dispense:  90 tablet    Refill:  3    Follow-up: No follow-ups on file.    Carolann Littler, MD

## 2021-07-07 ENCOUNTER — Encounter: Payer: Self-pay | Admitting: Family Medicine

## 2021-07-07 ENCOUNTER — Ambulatory Visit: Payer: BC Managed Care – PPO | Admitting: Family Medicine

## 2021-07-07 ENCOUNTER — Telehealth: Payer: Self-pay

## 2021-07-07 VITALS — BP 136/82 | HR 83 | Temp 98.3°F | Resp 18 | Wt 163.2 lb

## 2021-07-07 DIAGNOSIS — J029 Acute pharyngitis, unspecified: Secondary | ICD-10-CM | POA: Diagnosis not present

## 2021-07-07 LAB — POCT RAPID STREP A (OFFICE): Rapid Strep A Screen: NEGATIVE

## 2021-07-07 LAB — POC COVID19 BINAXNOW: SARS Coronavirus 2 Ag: NEGATIVE

## 2021-07-07 MED ORDER — BENZONATATE 100 MG PO CAPS: 100.0000 mg | ORAL_CAPSULE | Freq: Three times a day (TID) | ORAL | 0 refills | Status: DC | PRN

## 2021-07-07 NOTE — Progress Notes (Signed)
Established Patient Office Visit  Subjective:  Patient ID: Kelly Gilbert, female    DOB: 11-09-1960  Age: 60 y.o. MRN: [id number]  CC:  Chief Complaint  Patient presents with   Sore Throat    Pt c/o sore throat, neg, home Covid test. Pt states throat has been sore since Sunday evening feels like swallowing razor blades. Slight cough no fever.     HPI Kelly Gilbert presents for chief complaint of sore throat.  She has had some cough and some nasal congestion.  No definite fever.  She specifically has concerns about strep.  Home COVID test wasnegative.  Has taken some Tylenol.  Denies any nausea, vomiting, or diarrhea.  She has had flu vaccine.  Past Medical History:  Diagnosis Date   Arthritis    GERD (gastroesophageal reflux disease)    Lesion of bladder    Migraine headache    PONV (postoperative nausea and vomiting)     Past Surgical History:  Procedure Laterality Date   BENIGN LEFT BREASET BX  1992   COLONOSCOPY     CYSTOSCOPY WITH BIOPSY  01/16/2012   Procedure: CYSTOSCOPY WITH BIOPSY;  Surgeon: Molli Hazard, MD;  Location: [facility];  Service: Urology;  Laterality: N/A;  1 hour requested for this case  [place].    Family History  Problem Relation Age of Onset   Hypertension Father    Cancer Father        prostate   Hypertension Maternal Grandmother    Cancer Mother 56       renal cell cancer   Colon cancer Neg Hx    Pancreatic cancer Neg Hx    Stomach cancer Neg Hx    Esophageal cancer Neg Hx     Social History   Socioeconomic History   Marital status: Married    Spouse name: Not on file   Number of children: 2   Years of education: Not on file   Highest education level: Not on file  Occupational History    Comment: Realtor  Tobacco Use   Smoking status: Former    Packs/day: 0.25    Years: 35.00    Pack years: 8.75    Types: Cigarettes    Quit date: 06/26/2020     Years since quitting: 1.0   Smokeless tobacco: Never   Tobacco comments:    QUIT SMOKING 2008--  RECENTLY STARTED BACK 7-8 CIG. PER DAY  Vaping Use   Vaping Use: Never used  Substance and Sexual Activity   Alcohol use: Yes    Alcohol/week: 2.0 standard drinks    Types: 2 Shots of liquor per week    Comment: Occasional   Drug use: Never   Sexual activity: Not on file  Other Topics Concern   Not on file  Social History Narrative   Not on file   Social Determinants of Health   Financial Resource Strain: Not on file  Food Insecurity: Not on file  Transportation Needs: Not on file  Physical Activity: Not on file  Stress: Not on file  Social Connections: Not on file  Intimate Partner Violence: Not on file    Outpatient Medications Prior to Visit  Medication Sig Dispense Refill   amphetamine-dextroamphetamine (ADDERALL) 15 MG tablet Take 1 tablet by mouth 2 (two) times daily. May refill in one month 60 tablet 0   Aspirin-Acetaminophen-Caffeine (EXCEDRIN PO) Take 1 tablet by  mouth daily as needed (headache).      Biotin 5000 MCG CAPS Take 1 capsule by mouth every evening.      Boswellia Serrata (BOSWELLIA PO) Take by mouth.     Calcium Carb-Cholecalciferol (CALCIUM 600 + D PO) Take by mouth daily.     cholecalciferol (VITAMIN D3) 25 MCG (1000 UNIT) tablet Take 1,000 Units by mouth daily.     co-enzyme Q-10 30 MG capsule Take 30 mg by mouth 3 (three) times daily.     Estradiol-Progesterone 1-100 MG CAPS Take by mouth.     imipramine (TOFRANIL) 25 MG tablet TAKE ONE TABLET BY MOUTH DAILY AT BEDTIME *office visit needed for further refills* 90 tablet 3   L-LYSINE PO Take 1,000 mg by mouth daily.     magnesium 30 MG tablet Take 30 mg by mouth daily.     meclizine (ANTIVERT) 25 MG tablet Take 1 tablet (25 mg total) by mouth 3 (three) times daily as needed for dizziness. 30 tablet 0   meloxicam (MOBIC) 15 MG tablet Take 1 tablet (15 mg total) by mouth daily. 30 tablet 0   Multiple  Vitamin (MULTIVITAMIN PO) Take 1 capsule by mouth daily.      NON FORMULARY Doterra- alpha CRS and Cellular Vitality complex- 2 daily     NON FORMULARY Doterra Micro Plex VMz Food Nutrient complex- 2 daily     NON FORMULARY Doterra- EO Mega essential oil omega complex-  2 daily      omeprazole (PRILOSEC) 20 MG capsule Take 20 mg by mouth daily.     PENNSAID 2 % SOLN APPLY TWO PUMPS (40MG ) TOPICALLY TO AFFECTED AREA(S) TWICE A DAY 112 g 2   predniSONE (DELTASONE) 20 MG tablet Take 2 tablets (40 mg total) by mouth daily with breakfast. 10 tablet 0   Tiotropium Bromide Monohydrate (SPIRIVA RESPIMAT) 2.5 MCG/ACT AERS Inhale 2 puffs into the lungs daily. 4 g 0   triamcinolone cream (KENALOG) 0.1 % Apply 1 application topically 2 (two) times daily. For 1-2 weeks. (Patient taking differently: Apply 1 application topically 2 (two) times daily as needed (rash).) 45 g 0   TURMERIC PO Take 2 capsules by mouth.     vitamin C (ASCORBIC ACID) 250 MG tablet Take 500 mg by mouth daily.     Facility-Administered Medications Prior to Visit  Medication Dose Route Frequency Provider Last Rate Last Admin   0.9 %  sodium chloride infusion  500 mL Intravenous Once Pyrtle, Lajuan Lines, MD        Allergies  Allergen Reactions   Codeine Sulfate Nausea And Vomiting   Sulfa Antibiotics Hives and Swelling    ROS Review of Systems  Constitutional:  Negative for chills and fever.  HENT:  Positive for congestion and sore throat.   Respiratory:  Positive for cough.      Objective:    Physical Exam Vitals reviewed.  Constitutional:      General: She is not in acute distress.    Appearance: She is well-developed. She is not ill-appearing or toxic-appearing.  HENT:     Right Ear: Tympanic membrane normal.     Left Ear: Tympanic membrane normal.     Mouth/Throat:     Comments: Minimal posterior pharynx erythema.  No exudate. Musculoskeletal:     Cervical back: Neck supple.  Lymphadenopathy:     Cervical: No  cervical adenopathy.  Neurological:     Mental Status: She is alert.    BP 136/82 (BP Location: Left  Arm, Patient Position: Sitting, Cuff Size: Normal)   Pulse 83   Temp 98.3 F (36.8 C) (Oral)   Resp 18   Wt 163 lb 3.2 oz (74 kg)   SpO2 99%   BMI 25.95 kg/m  Wt Readings from Last 3 Encounters:  07/07/21 163 lb 3.2 oz (74 kg)  05/18/21 167 lb 9.6 oz (76 kg)  11/02/20 174 lb 3.2 oz (79 kg)     Health Maintenance Due  Topic Date Due   Pneumococcal Vaccine 54-30 Years old (1 - PCV) Never done   HIV Screening  Never done   Hepatitis C Screening  Never done   Zoster Vaccines- Shingrix (1 of 2) Never done   MAMMOGRAM  05/13/2016   COVID-19 Vaccine (3 - Booster for Janssen series) 08/13/2020    There are no preventive care reminders to display for this patient.  Lab Results  Component Value Date   TSH 0.93 12/18/2015   Lab Results  Component Value Date   WBC 7.1 09/24/2019   HGB 12.6 09/24/2019   HCT 39.3 09/24/2019   MCV 90.6 09/24/2019   PLT 343 09/24/2019   Lab Results  Component Value Date   NA 142 09/24/2019   K 3.9 09/24/2019   CO2 28 09/24/2019   GLUCOSE 93 09/24/2019   BUN 15 09/24/2019   CREATININE 0.79 09/24/2019   BILITOT 0.2 12/18/2015   ALKPHOS 60 12/18/2015   AST 12 12/18/2015   ALT 11 12/18/2015   PROT 6.4 12/18/2015   ALBUMIN 4.2 12/18/2015   CALCIUM 9.4 09/24/2019   ANIONGAP 8 09/24/2019   GFR 94.52 12/18/2015   Lab Results  Component Value Date   CHOL 206 (H) 12/18/2015   Lab Results  Component Value Date   HDL 62.30 12/18/2015   Lab Results  Component Value Date   LDLCALC 125 (H) 12/18/2015   Lab Results  Component Value Date   TRIG 94.0 12/18/2015   Lab Results  Component Value Date   CHOLHDL 3 12/18/2015   No results found for: HGBA1C    Assessment & Plan:   Problem List Items Addressed This Visit   None Visit Diagnoses     Sore throat    -  Primary   Relevant Orders   POCT rapid strep A (Completed)   POC  COVID-19 (Completed)     Sore throat.  Rapid strep negative.  COVID testing negative.  Doubt influenza.  -Symptomatic treatment with Motrin or Aleve for sore throat symptoms. -She requested Tessalon Perles for cough and this will be sent in. -Follow-up for any persistent or worsening symptoms.  Meds ordered this encounter  Medications   benzonatate (TESSALON) 100 MG capsule    Sig: Take 1 capsule (100 mg total) by mouth 3 (three) times daily as needed for cough.    Dispense:  30 capsule    Refill:  0    Follow-up: No follow-ups on file.    Carolann Littler, MD

## 2021-07-07 NOTE — Telephone Encounter (Signed)
Patient calling in with respiratory symptoms: Shortness of breath, chest pain, palpitations or other red words send to Triage  Does the patient have a fever over 100, cough, congestion, sore throat, runny nose, lost of taste/smell within the last 5 days (please list symptoms that patient has)? yes Sore throat Have you tested for Covid in the last 5 days? Yes   If yes, was it positive []  OR negative [x] ?

## 2021-08-03 ENCOUNTER — Telehealth: Payer: Self-pay | Admitting: Family Medicine

## 2021-08-03 NOTE — Telephone Encounter (Signed)
Patient calling in with respiratory symptoms: Shortness of breath, chest pain, palpitations or other red words send to Triage  Does the patient have a fever over 100, cough, congestion, sore throat, runny nose, lost of taste/smell (please list symptoms that patient has)?cough  What date did symptoms start?with last 5 days (If over 5 days ago, pt may be scheduled for in person visit)  Have you tested for Covid in the last 5 days? Yes   If yes, was it positive [x]  OR negative [] ? If positive in the last 5 days, please schedule virtual visit now. If negative, schedule for an in person OV with the next available provider if PCP has no openings. Please also let patient know they will be tested again (follow the script below)  "you will have to arrive 10mins prior to your appt time to be Covid tested. Please park in back of office at the [facility] & call [phone] to let the staff know you have arrived. A staff member will meet you at your car to do a rapid covid test. Once the test has resulted you will be notified by phone of your results to determine if appt will remain an in person visit or be converted to a virtual/phone visit. If you arrive less than 79mins before your appt time, your visit will be automatically converted to virtual & any recommended testing will happen AFTER the visit." Pt tested  covid positive yesterday 08-02-2021 and has a virtual with dr Elease Hashimoto on 08-04-2021  THINGS TO REMEMBER  If no availability for virtual visit in office,  please schedule another [place] office  If no availability at another [place] office, please instruct patient that they can schedule an evisit or virtual visit through their mychart account. Visits up to 8pm  patients can be seen in office 5 days after positive COVID test

## 2021-08-04 ENCOUNTER — Telehealth (INDEPENDENT_AMBULATORY_CARE_PROVIDER_SITE_OTHER): Payer: BC Managed Care – PPO | Admitting: Family Medicine

## 2021-08-04 VITALS — Ht 66.5 in | Wt 163.0 lb

## 2021-08-04 DIAGNOSIS — U071 COVID-19: Secondary | ICD-10-CM | POA: Diagnosis not present

## 2021-08-04 MED ORDER — MOLNUPIRAVIR EUA 200MG CAPSULE: 4.0000 | ORAL_CAPSULE | Freq: Two times a day (BID) | ORAL | 0 refills | Status: AC

## 2021-08-04 MED ORDER — BENZONATATE 100 MG PO CAPS
100.0000 mg | ORAL_CAPSULE | Freq: Three times a day (TID) | ORAL | 0 refills | Status: DC | PRN
Start: 2021-08-04 — End: 2022-01-24

## 2021-08-04 NOTE — Progress Notes (Signed)
Patient ID: Kelly Gilbert, female   DOB: Mar 08, 1961, 60 y.o.   MRN: [id number]   This visit type was conducted due to national recommendations for restrictions regarding the COVID-19 pandemic in an effort to limit this patient's exposure and mitigate transmission in our community.   Virtual Visit via Video Note  I connected with Kelly Gilbert on 08/04/21 at  1:45 PM EST by a video enabled telemedicine application and verified that I am speaking with the correct person using two identifiers.  Location patient: home Location provider:work or home office Persons participating in the virtual visit: patient, provider  I discussed the limitations of evaluation and management by telemedicine and the availability of in person appointments. The patient expressed understanding and agreed to proceed.   HPI:  Onset Christmas Day of some nasal congestion and cough and sore throat.  Increased malaise.  Some body aches.  No fever.  Husband diagnosed recently with COVID.  Patient tested positive on the 26th.  No major dyspnea.  No nausea, vomiting, or diarrhea.  She does have past history of smoking.  Cough relatively mild at this time.   ROS: See pertinent positives and negatives per HPI.  Past Medical History:  Diagnosis Date   Arthritis    GERD (gastroesophageal reflux disease)    Lesion of bladder    Migraine headache    PONV (postoperative nausea and vomiting)     Past Surgical History:  Procedure Laterality Date   BENIGN LEFT BREASET BX  1992   COLONOSCOPY     CYSTOSCOPY WITH BIOPSY  01/16/2012   Procedure: CYSTOSCOPY WITH BIOPSY;  Surgeon: Molli Hazard, MD;  Location: [facility];  Service: Urology;  Laterality: N/A;  1 hour requested for this case  [place].    Family History  Problem Relation Age of Onset   Hypertension Father    Cancer Father        prostate   Hypertension Maternal Grandmother     Cancer Mother 52       renal cell cancer   Colon cancer Neg Hx    Pancreatic cancer Neg Hx    Stomach cancer Neg Hx    Esophageal cancer Neg Hx     SOCIAL HX: Quit smoking within the past year   Current Outpatient Medications:    amphetamine-dextroamphetamine (ADDERALL) 15 MG tablet, Take 1 tablet by mouth 2 (two) times daily. May refill in one month, Disp: 60 tablet, Rfl: 0   Aspirin-Acetaminophen-Caffeine (EXCEDRIN PO), Take 1 tablet by mouth daily as needed (headache). , Disp: , Rfl:    Biotin 5000 MCG CAPS, Take 1 capsule by mouth every evening. , Disp: , Rfl:    Boswellia Serrata (BOSWELLIA PO), Take by mouth., Disp: , Rfl:    Calcium Carb-Cholecalciferol (CALCIUM 600 + D PO), Take by mouth daily., Disp: , Rfl:    cholecalciferol (VITAMIN D3) 25 MCG (1000 UNIT) tablet, Take 1,000 Units by mouth daily., Disp: , Rfl:    co-enzyme Q-10 30 MG capsule, Take 30 mg by mouth 3 (three) times daily., Disp: , Rfl:    imipramine (TOFRANIL) 25 MG tablet, TAKE ONE TABLET BY MOUTH DAILY AT BEDTIME *office visit needed for further refills*, Disp: 90 tablet, Rfl: 3   L-LYSINE PO, Take 1,000 mg by mouth daily., Disp: , Rfl:    magnesium 30 MG tablet, Take 30 mg by mouth daily., Disp: , Rfl:  meclizine (ANTIVERT) 25 MG tablet, Take 1 tablet (25 mg total) by mouth 3 (three) times daily as needed for dizziness., Disp: 30 tablet, Rfl: 0   molnupiravir EUA (LAGEVRIO) 200 mg CAPS capsule, Take 4 capsules (800 mg total) by mouth 2 (two) times daily for 5 days., Disp: 40 capsule, Rfl: 0   omeprazole (PRILOSEC) 20 MG capsule, Take 20 mg by mouth daily., Disp: , Rfl:    TURMERIC PO, Take 2 capsules by mouth., Disp: , Rfl:    vitamin C (ASCORBIC ACID) 250 MG tablet, Take 500 mg by mouth daily., Disp: , Rfl:    benzonatate (TESSALON) 100 MG capsule, Take 1 capsule (100 mg total) by mouth 3 (three) times daily as needed for cough., Disp: 30 capsule, Rfl: 0   estradiol (VIVELLE-DOT) 0.05 MG/24HR patch, Place 1  patch onto the skin 2 (two) times a week., Disp: , Rfl:    Estradiol-Progesterone 1-100 MG CAPS, Take by mouth. (Patient not taking: Reported on 08/04/2021), Disp: , Rfl:    meloxicam (MOBIC) 15 MG tablet, Take 1 tablet (15 mg total) by mouth daily., Disp: 30 tablet, Rfl: 0   Multiple Vitamin (MULTIVITAMIN PO), Take 1 capsule by mouth daily.  (Patient not taking: Reported on 08/04/2021), Disp: , Rfl:    NON FORMULARY, Doterra- alpha CRS and Cellular Vitality complex- 2 daily (Patient not taking: Reported on 08/04/2021), Disp: , Rfl:    NON FORMULARY, Doterra Micro Plex VMz Food Nutrient complex- 2 daily (Patient not taking: Reported on 08/04/2021), Disp: , Rfl:    NON FORMULARY, Doterra- EO Mega essential oil omega complex-  2 daily  (Patient not taking: Reported on 08/04/2021), Disp: , Rfl:    PENNSAID 2 % SOLN, APPLY TWO PUMPS (40MG ) TOPICALLY TO AFFECTED AREA(S) TWICE A DAY (Patient not taking: Reported on 08/04/2021), Disp: 112 g, Rfl: 2   Tiotropium Bromide Monohydrate (SPIRIVA RESPIMAT) 2.5 MCG/ACT AERS, Inhale 2 puffs into the lungs daily. (Patient not taking: Reported on 08/04/2021), Disp: 4 g, Rfl: 0   triamcinolone cream (KENALOG) 0.1 %, Apply 1 application topically 2 (two) times daily. For 1-2 weeks. (Patient not taking: Reported on 08/04/2021), Disp: 45 g, Rfl: 0  Current Facility-Administered Medications:    0.9 %  sodium chloride infusion, 500 mL, Intravenous, Once, Pyrtle, Lajuan Lines, MD  EXAM:  VITALS per patient if applicable:  GENERAL: alert, oriented, appears well and in no acute distress  HEENT: atraumatic, conjunttiva clear, no obvious abnormalities on inspection of external nose and ears  NECK: normal movements of the head and neck  LUNGS: on inspection no signs of respiratory distress, breathing rate appears normal, no obvious gross SOB, gasping or wheezing  CV: no obvious cyanosis  MS: moves all visible extremities without noticeable abnormality  PSYCH/NEURO:  pleasant and cooperative, no obvious depression or anxiety, speech and thought processing grossly intact  ASSESSMENT AND PLAN:  Discussed the following assessment and plan:  COVID-19 patient has relatively mild symptoms.  We discussed possible use of antiviral therapy and decided to start molnupiravir 4 capsules by mouth twice daily for 5 days Follow-up for any persistent or worsening symptoms     I discussed the assessment and treatment plan with the patient. The patient was provided an opportunity to ask questions and all were answered. The patient agreed with the plan and demonstrated an understanding of the instructions.   The patient was advised to call back or seek an in-person evaluation if the symptoms worsen or if the condition fails to  improve as anticipated.     Carolann Littler, MD

## 2021-11-19 ENCOUNTER — Telehealth: Payer: Self-pay | Admitting: *Deleted

## 2021-11-19 NOTE — Telephone Encounter (Signed)
Left message for pt to call back to schedule f/u lung screening CT scan.  ?

## 2022-01-13 NOTE — Progress Notes (Unsigned)
[place] Phone: [phone] Subjective:   Kelly Gilbert, am serving as a scribe for Dr. Hulan Saas.   I'm seeing this patient by the request  of:  Eulas Post, MD  CC: back and neck pain f/u   [id number]  09/08/2021 Patient is having more low back pain that seems to radiate into the lateral aspects of the hips bilaterally.  Patient had had an MRI in 2017 that was fairly unremarkable.  These were independently visualized by me today with very mild narrowing of the spinal column but nothing significant.  Patient though has signs and symptoms that are consistent with more of a nerve root impingement versus possible chronic greater trochanteric bursitis.  Patient though has not responded as well to the injections previously.  Patient has failed home exercises and formal physical therapy.  Pain has been going on for greater than 11 years.  Do feel advanced imaging is warranted at this point with it now been nearly 5 years since last advanced imaging and patient could be a candidate for epidurals.  We will also get an MRI of the pelvis without contrast to further evaluate the hips and the sacroiliac joints to see if anything else is contributing.  Follow-up with me again after imaging to discuss.  Updated 01/18/2022 Kelly Gilbert is a 61 y.o. female coming in with complaint of neck pain for past month. Pain is similar as it was prior to having to get an injection. Pain radiates down both arms. Started in L but is now mostly in R. Last epidural August 2021       Past Medical History:  Diagnosis Date   Arthritis    GERD (gastroesophageal reflux disease)    Lesion of bladder    Migraine headache    PONV (postoperative nausea and vomiting)    Past Surgical History:  Procedure Laterality Date   BENIGN LEFT BREASET BX  1992   COLONOSCOPY     CYSTOSCOPY WITH BIOPSY  01/16/2012   Procedure: CYSTOSCOPY WITH  BIOPSY;  Surgeon: Molli Hazard, MD;  Location: [facility];  Service: Urology;  Laterality: N/A;  1 hour requested for this case  [place].   Social History   Socioeconomic History   Marital status: Married    Spouse name: Not on file   Number of children: 2   Years of education: Not on file   Highest education level: Not on file  Occupational History    Comment: Realtor  Tobacco Use   Smoking status: Former    Packs/day: 0.25    Years: 35.00    Total pack years: 8.75    Types: Cigarettes    Quit date: 06/26/2020    Years since quitting: 1.5   Smokeless tobacco: Never   Tobacco comments:    QUIT SMOKING 2008--  RECENTLY STARTED BACK 7-8 CIG. PER DAY  Vaping Use   Vaping Use: Never used  Substance and Sexual Activity   Alcohol use: Yes    Alcohol/week: 2.0 standard drinks of alcohol    Types: 2 Shots of liquor per week    Comment: Occasional   Drug use: Never   Sexual activity: Not on file  Other Topics Concern   Not on file  Social History Narrative   Not on file   Social Determinants of Health   Financial Resource  Strain: Not on file  Food Insecurity: Not on file  Transportation Needs: Not on file  Physical Activity: Not on file  Stress: Not on file  Social Connections: Not on file   Allergies  Allergen Reactions   Codeine Sulfate Nausea And Vomiting   Sulfa Antibiotics Hives and Swelling   Family History  Problem Relation Age of Onset   Hypertension Father    Cancer Father        prostate   Hypertension Maternal Grandmother    Cancer Mother 33       renal cell cancer   Colon cancer Neg Hx    Pancreatic cancer Neg Hx    Stomach cancer Neg Hx    Esophageal cancer Neg Hx     Current Outpatient Medications (Endocrine & Metabolic):    estradiol (VIVELLE-DOT) 0.05 MG/24HR patch, Place 1 patch onto the skin 2 (two) times a week.   Estradiol-Progesterone 1-100 MG CAPS, Take  by mouth.     Current Outpatient Medications (Respiratory):    benzonatate (TESSALON) 100 MG capsule, Take 1 capsule (100 mg total) by mouth 3 (three) times daily as needed for cough.   Tiotropium Bromide Monohydrate (SPIRIVA RESPIMAT) 2.5 MCG/ACT AERS, Inhale 2 puffs into the lungs daily. (Patient not taking: Reported on 08/04/2021)   Current Outpatient Medications (Analgesics):    Aspirin-Acetaminophen-Caffeine (EXCEDRIN PO), Take 1 tablet by mouth daily as needed (headache).    naproxen (NAPROSYN) 500 MG tablet, Take 1 tablet (500 mg total) by mouth 2 (two) times daily as needed.     Current Outpatient Medications (Other):    amphetamine-dextroamphetamine (ADDERALL) 15 MG tablet, Take 1 tablet by mouth 2 (two) times daily. May refill in one month   Biotin 5000 MCG CAPS, Take 1 capsule by mouth every evening.    Boswellia Serrata (BOSWELLIA PO), Take by mouth.   Calcium Carb-Cholecalciferol (CALCIUM 600 + D PO), Take by mouth daily.   cholecalciferol (VITAMIN D3) 25 MCG (1000 UNIT) tablet, Take 1,000 Units by mouth daily.   co-enzyme Q-10 30 MG capsule, Take 30 mg by mouth 3 (three) times daily.   imipramine (TOFRANIL) 25 MG tablet, TAKE ONE TABLET BY MOUTH DAILY AT BEDTIME *office visit needed for further refills*   L-LYSINE PO, Take 1,000 mg by mouth daily.   magnesium 30 MG tablet, Take 30 mg by mouth daily.   Multiple Vitamin (MULTIVITAMIN PO), Take 1 capsule by mouth daily.   omeprazole (PRILOSEC) 20 MG capsule, Take 20 mg by mouth daily.   PENNSAID 2 % SOLN, APPLY TWO PUMPS ('40MG'$ ) TOPICALLY TO AFFECTED AREA(S) TWICE A DAY   triamcinolone cream (KENALOG) 0.1 %, Apply 1 application topically 2 (two) times daily. For 1-2 weeks.   TURMERIC PO, Take 2 capsules by mouth.   vitamin C (ASCORBIC ACID) 250 MG tablet, Take 500 mg by mouth daily.  Current Facility-Administered Medications (Other):    0.9 %  sodium chloride infusion   Reviewed prior external information including  notes and imaging from  primary care provider patient did have COVID in December 2022 As well as notes that were available from care everywhere and other healthcare systems.  Past medical history, social, surgical and family history all reviewed in electronic medical record.  Gilbert pertanent information unless stated regarding to the chief complaint.   Review of Systems:  Gilbert headache, visual changes, nausea, vomiting, diarrhea, constipation, dizziness, abdominal pain, skin rash, fevers, chills, night sweats, weight loss, swollen lymph nodes, body aches, joint swelling, chest pain,  shortness of breath, mood changes. POSITIVE muscle aches  Objective  Blood pressure 122/84, pulse (!) 59, height 5' 6.5" (1.689 m), weight 165 lb (74.8 kg), SpO2 99 %.   General: Gilbert apparent distress alert and oriented x3 mood and affect normal, dressed appropriately.  HEENT: Pupils equal, extraocular movements intact  Respiratory: Patient's speak in full sentences and does not appear short of breath  Cardiovascular: Gilbert lower extremity edema, non tender, Gilbert erythema  Does have a cyst on extension. To radicular symptoms in the C7 and C8 distribution.  Patient does have very mild weakness also in the C8 distribution noted on exam today.  This is worse than previous exam.    Impression and Recommendations:    The above documentation has been reviewed and is accurate and complete Lyndal Pulley, DO

## 2022-01-18 ENCOUNTER — Ambulatory Visit (INDEPENDENT_AMBULATORY_CARE_PROVIDER_SITE_OTHER): Payer: 59

## 2022-01-18 ENCOUNTER — Ambulatory Visit (INDEPENDENT_AMBULATORY_CARE_PROVIDER_SITE_OTHER): Payer: 59 | Admitting: Family Medicine

## 2022-01-18 VITALS — BP 122/84 | HR 59 | Ht 66.5 in | Wt 165.0 lb

## 2022-01-18 DIAGNOSIS — M542 Cervicalgia: Secondary | ICD-10-CM

## 2022-01-18 DIAGNOSIS — M5010 Cervical disc disorder with radiculopathy, unspecified cervical region: Secondary | ICD-10-CM | POA: Diagnosis not present

## 2022-01-18 IMAGING — DX DG CERVICAL SPINE 2 OR 3 VIEWS
3 series · 3 of 3 positions shown · non-contrast
Comparison: None Available.

CLINICAL DATA: Neck pain

EXAM:
CERVICAL SPINE - 2-3 VIEW

[c-spine lat]
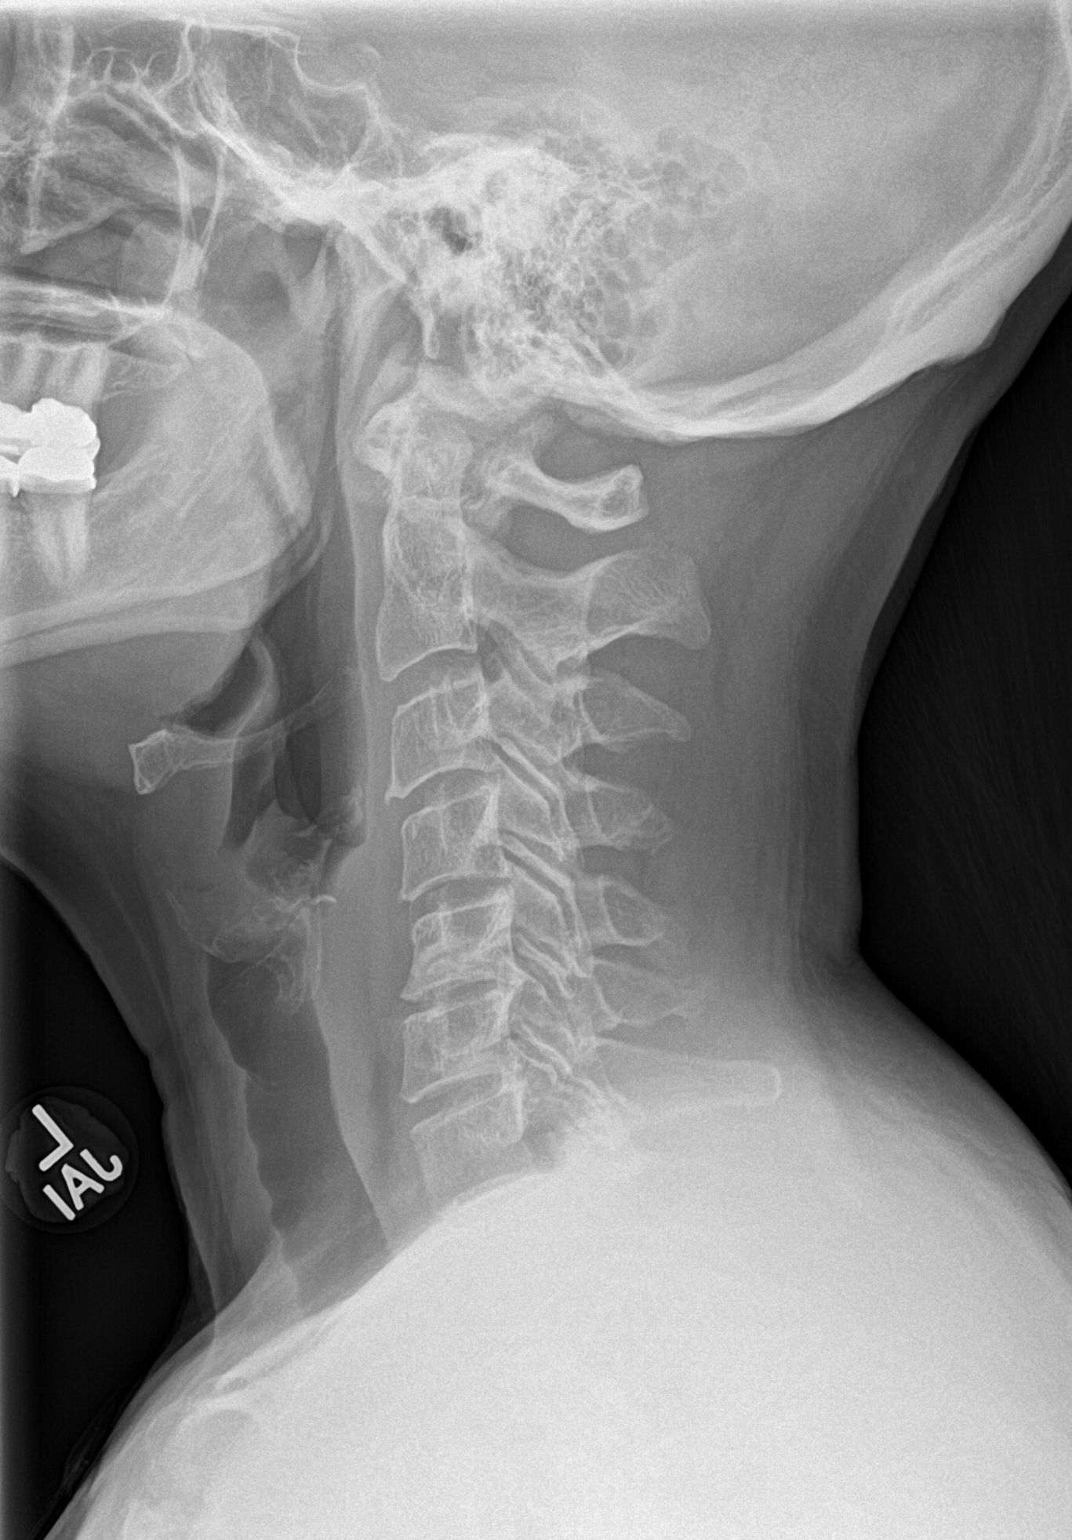

[c-spine ap]
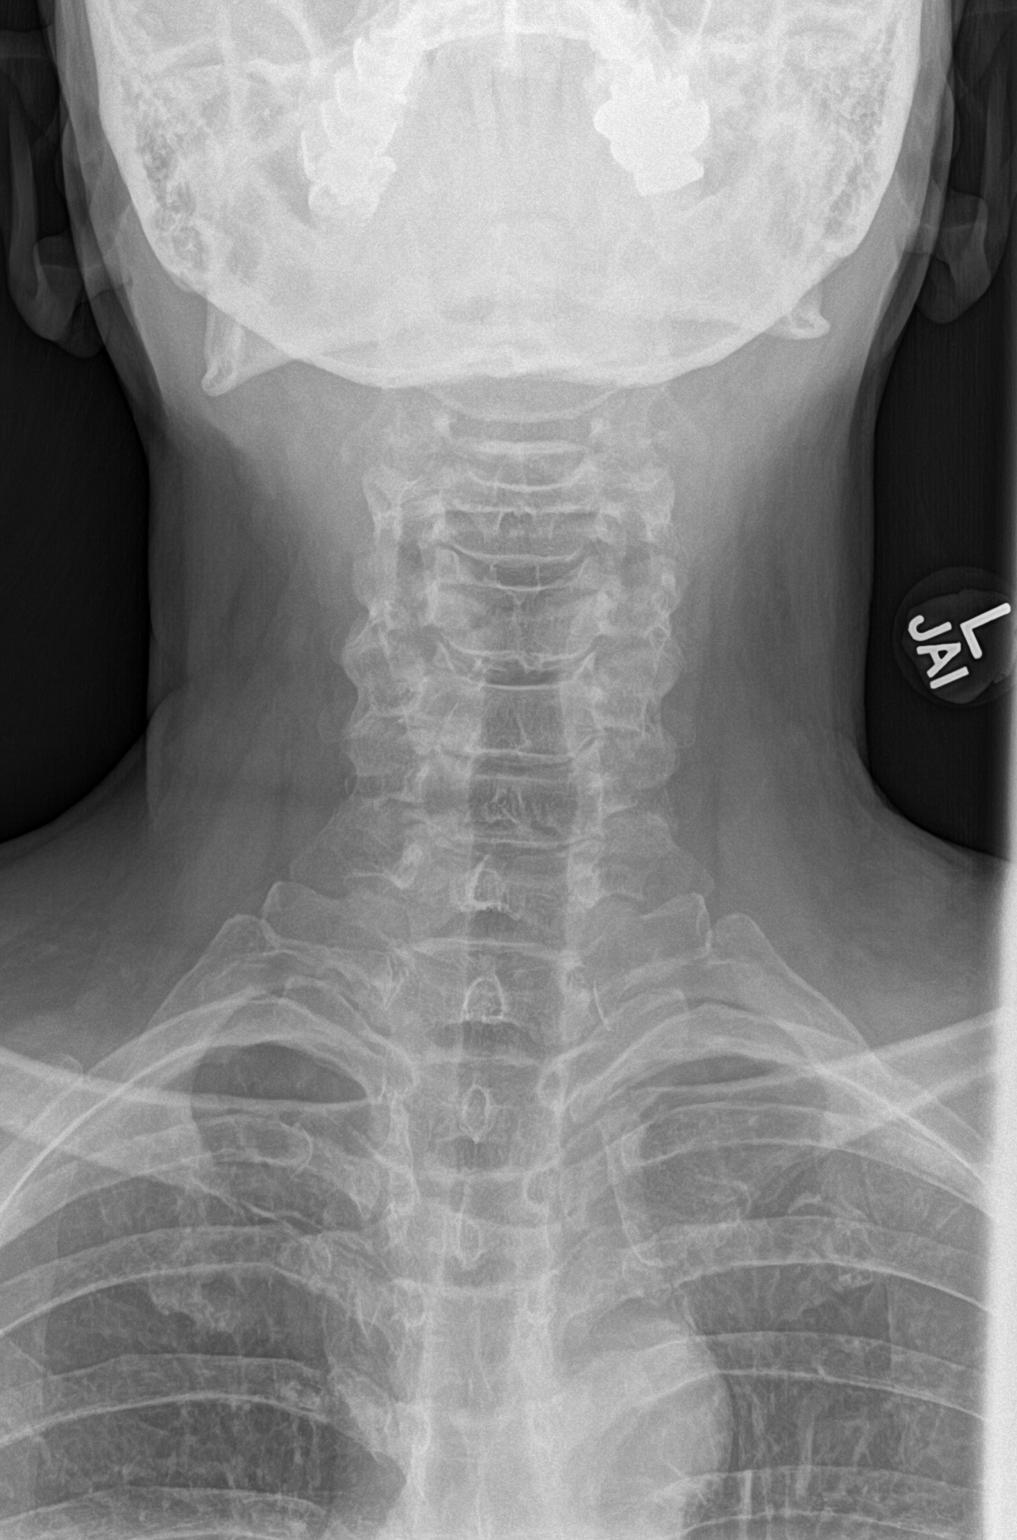

[c-spine open mouth]
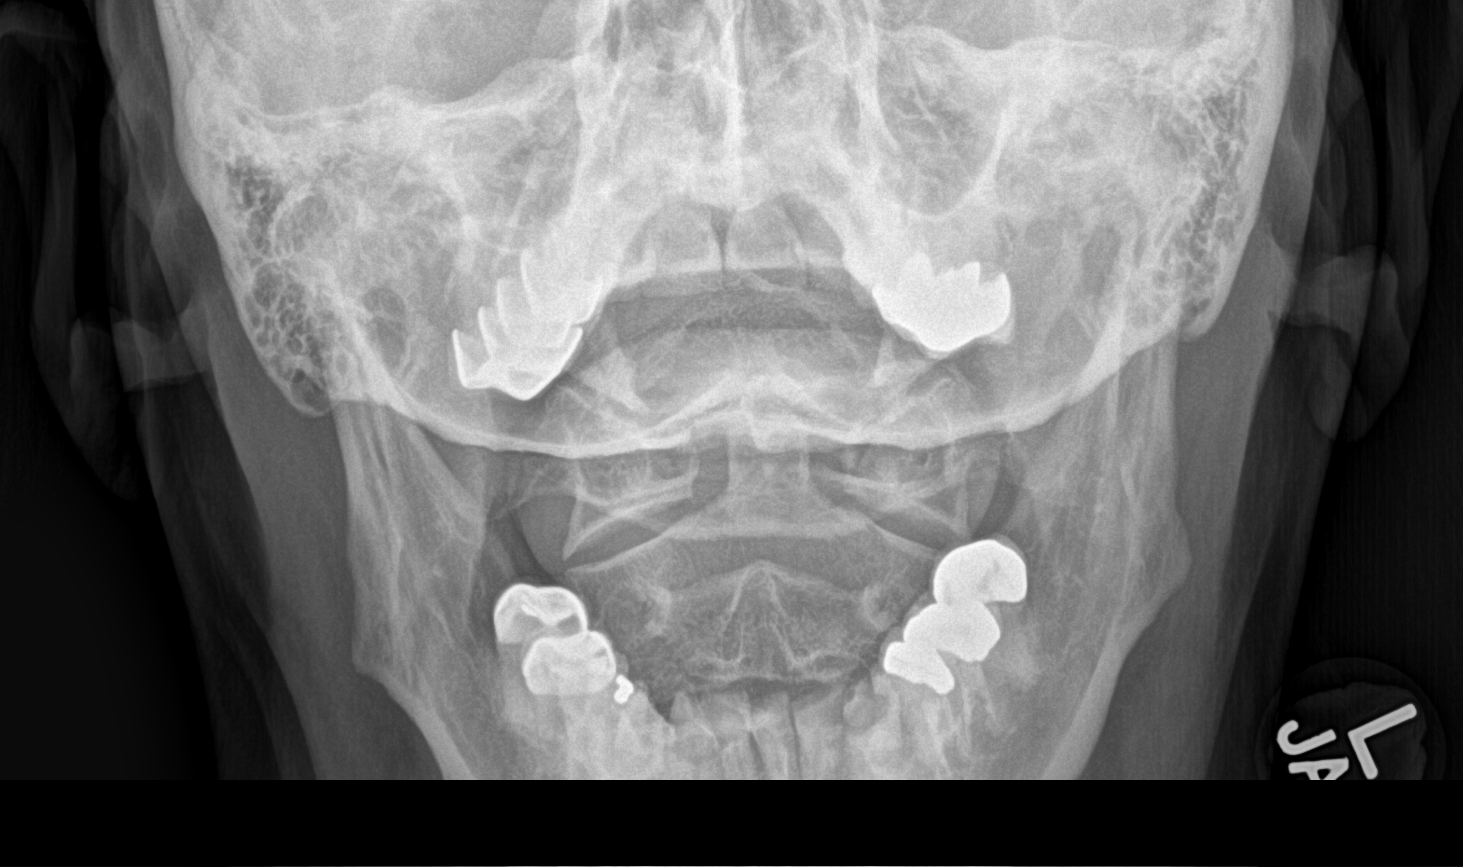

[3 of 3 positions shown; findings below may reference images not displayed]

FINDINGS: Straightening of the cervical lordosis. No acute fracture or
malalignment identified. Mild to moderate intervertebral disc space
narrowing at C5-C6 with small dorsal endplate osteophytes.
IMPRESSION: Degenerative changes with no acute fracture identified.

## 2022-01-18 MED ORDER — NAPROXEN 500 MG PO TABS: 500.0000 mg | ORAL_TABLET | Freq: Two times a day (BID) | ORAL | 1 refills | Status: DC | PRN

## 2022-01-18 NOTE — Patient Instructions (Addendum)
Xrays today [place] [phone] Call Today  Naproxen prescription filled See you again in 4-6 weeks after your epidural

## 2022-01-18 NOTE — Assessment & Plan Note (Signed)
Chronic problem with exacerbation.  Patient is having some radicular symptoms down the left side at the moment.  Hip pain seems to be doing much better overall overall.  At this point with patient having worsening discomfort and pain we did discuss the possibility of gabapentin and patient does not want to take it if she can avoid it.  Patient would like to try the epidural that was significantly successful in August 2021.  We will get repeat x-ray today but do not expect any significant changes. Will try naproxen instead of meloxicam.  Follow-up again in 6 to 8 weeks

## 2022-01-21 ENCOUNTER — Ambulatory Visit: Payer: Self-pay | Admitting: Family Medicine

## 2022-01-24 ENCOUNTER — Encounter: Payer: Self-pay | Admitting: Family Medicine

## 2022-01-24 ENCOUNTER — Ambulatory Visit (INDEPENDENT_AMBULATORY_CARE_PROVIDER_SITE_OTHER): Payer: 59 | Admitting: Family Medicine

## 2022-01-24 VITALS — BP 110/70 | HR 76 | Temp 97.4°F | Ht 66.5 in | Wt 165.9 lb

## 2022-01-24 DIAGNOSIS — N951 Menopausal and female climacteric states: Secondary | ICD-10-CM | POA: Diagnosis not present

## 2022-01-24 DIAGNOSIS — R6882 Decreased libido: Secondary | ICD-10-CM

## 2022-01-24 NOTE — Progress Notes (Signed)
Established Patient Office Visit  Subjective   Patient ID: Kelly Gilbert, female    DOB: 03-08-1961  Age: 61 y.o. MRN: [id number]  Chief Complaint  Patient presents with   Medication Consultation    HPI   Kelly Gilbert is here to discuss postmenopausal symptoms.  She [facility] to [facility] and apparently was placed recently on topical estrogen patch and progesterone 200 mg daily.  She has had previous hysterectomy.  She quit smoking over a year ago.  She apparently was given some testosterone which did help libido but she does not wish to continue that.  She was placed on combination with hormonal therapy as above but this has not really affected her libido.  The only benefit she has seen is less hot flashes.  No significant increase in energy.  She has not had a mammogram in several years.  Past Medical History:  Diagnosis Date   Arthritis    GERD (gastroesophageal reflux disease)    Lesion of bladder    Migraine headache    PONV (postoperative nausea and vomiting)    Past Surgical History:  Procedure Laterality Date   BENIGN LEFT BREASET BX  1992   COLONOSCOPY     CYSTOSCOPY WITH BIOPSY  01/16/2012   Procedure: CYSTOSCOPY WITH BIOPSY;  Surgeon: Molli Hazard, MD;  Location: [facility];  Service: Urology;  Laterality: N/A;  1 hour requested for this case  [place].    reports that she quit smoking about 18 months ago. Her smoking use included cigarettes. She has a 8.75 pack-year smoking history. She has never used smokeless tobacco. She reports current alcohol use of about 2.0 standard drinks of alcohol per week. She reports that she does not use drugs. family history includes Cancer in her father; Cancer (age of onset: 80) in her mother; Hypertension in her father and maternal grandmother. Allergies  Allergen Reactions   Codeine Sulfate Nausea And Vomiting   Sulfa  Antibiotics Hives and Swelling    Review of Systems  Constitutional:  Negative for chills and fever.  Respiratory:  Negative for shortness of breath.   Cardiovascular:  Negative for chest pain.  Neurological:  Negative for headaches.      Objective:     BP 110/70 (BP Location: Left Arm, Patient Position: Sitting, Cuff Size: Normal)   Pulse 76   Temp (!) 97.4 F (36.3 C) (Oral)   Ht 5' 6.5" (1.689 m)   Wt 165 lb 14.4 oz (75.3 kg)   SpO2 97%   BMI 26.38 kg/m  BP Readings from Last 3 Encounters:  01/24/22 110/70  01/18/22 122/84  07/07/21 136/82   Wt Readings from Last 3 Encounters:  01/24/22 165 lb 14.4 oz (75.3 kg)  01/18/22 165 lb (74.8 kg)  08/04/21 163 lb (73.9 kg)      Physical Exam Vitals reviewed.  Constitutional:      Appearance: Normal appearance.  Cardiovascular:     Rate and Rhythm: Normal rate and regular rhythm.  Pulmonary:     Effort: Pulmonary effort is normal.     Breath sounds: Normal breath sounds. No wheezing or rales.  Neurological:     Mental Status: She is alert.      No results found for any visits on 01/24/22.    The ASCVD Risk score (Arnett DK, et al., 2019) failed to calculate for the following reasons:   Cannot  find a previous HDL lab   Cannot find a previous total cholesterol lab    Assessment & Plan:   Problem List Items Addressed This Visit   None Visit Diagnoses     Low libido    -  Primary   Hot flashes, menopausal         We had a long discussion regarding postmenopausal hormonal replacement.  She was recently placed at another facility on combination with estrogen and progesterone.  We are not sure for the rationale for combination therapy since she has had previous hysterectomy.  We did discuss potential risk of postmenopausal hormonal replacement.  We discussed risk including but not limited to increased coagulopathies and increased risk of breast cancer especially with combination therapy.  We also discussed our  concerns that she was placed on hormone therapy without recent mammogram  -After discussion she has decided she would like to leave off the progesterone which seems reasonable. -She will maintain the estrogen patch for now for hot flashes but consider tapering off later. -We strongly suggested that she promptly get a mammogram to further assess if she is to remain on any form of estrogen  No follow-ups on file.    Carolann Littler, MD

## 2022-01-25 ENCOUNTER — Ambulatory Visit
Admission: RE | Admit: 2022-01-25 | Discharge: 2022-01-25 | Disposition: A | Discharge status: Home / Self Care | Payer: 59 | Source: Ambulatory Visit | Attending: Family Medicine | Admitting: Family Medicine

## 2022-01-25 DIAGNOSIS — M542 Cervicalgia: Secondary | ICD-10-CM

## 2022-01-25 IMAGING — XA DG INJECT/[PERSON_NAME] INC NEEDLE/CATH/PLC EPI/CERV/THOR W/IMG
3 series · 3 of 3 positions shown · non-contrast
Comparison: none

CLINICAL DATA: Cervical spondylosis without myelopathy. Excellent
response to the epidural injection in [DATE]. Recurrence of
symptoms 1 month ago with bilateral neck and occasional left arm
pain.

[Series 1: ortho adipose · 1 of 1 slices shown (1 of 3)]
[im 1/1]
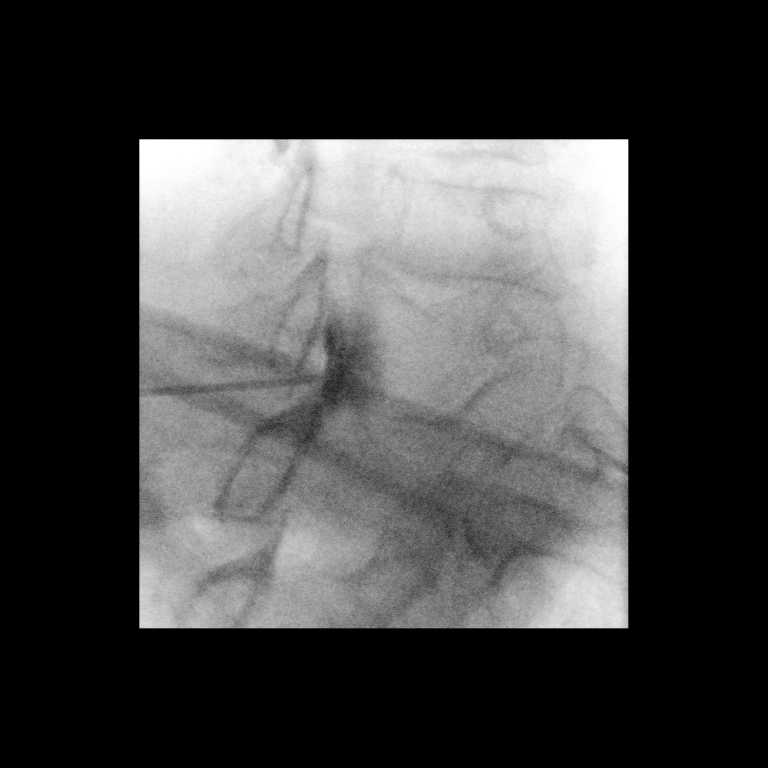

[Series 2: ortho adipose · 1 of 1 slices shown (2 of 3)]
[im 1/1]
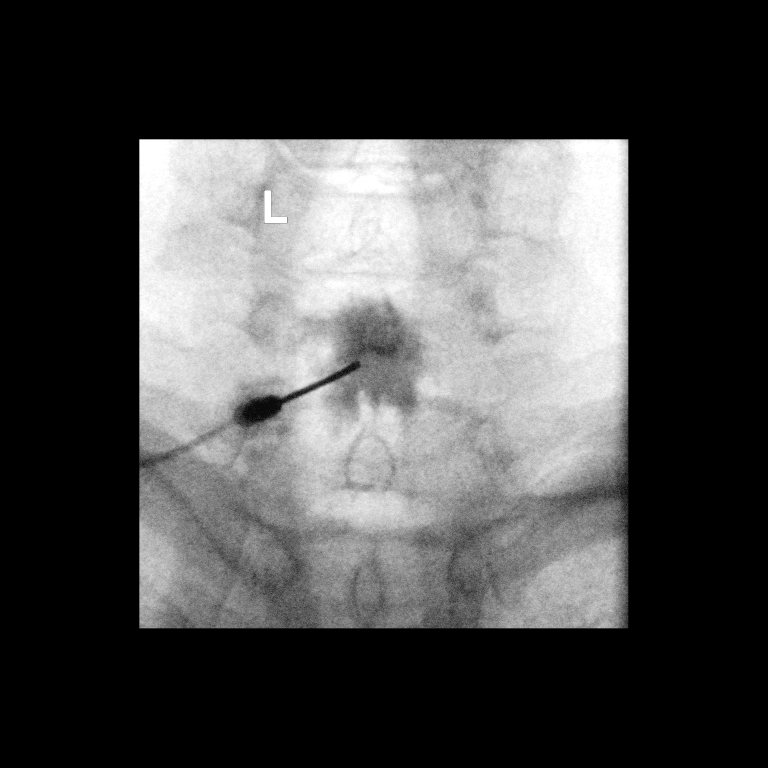

[Series 3: ortho adipose · 1 of 1 slices shown (3 of 3)]
[im 1/1]
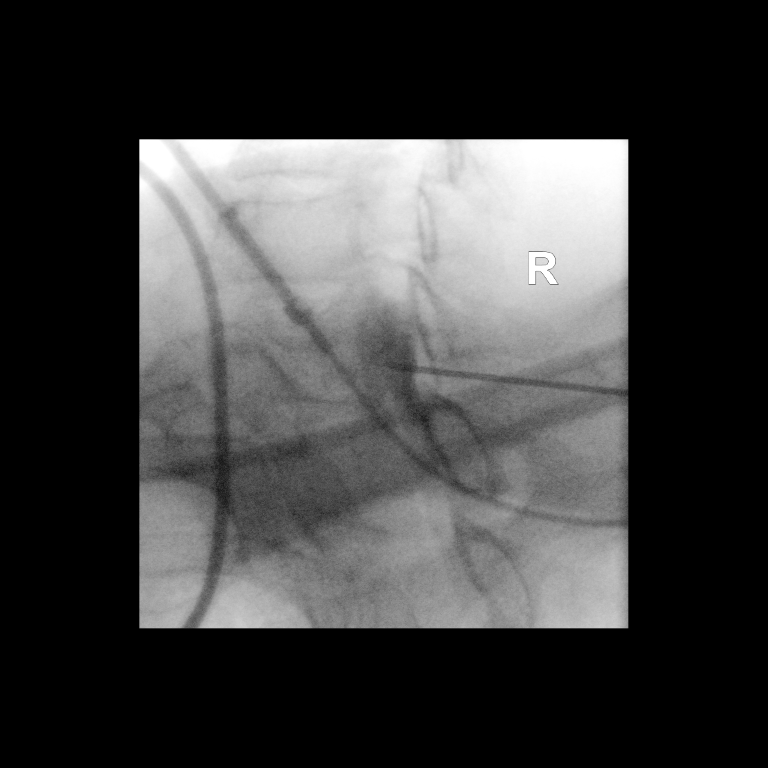

[3 of 3 positions shown; findings below may reference images not displayed]

FLUOROSCOPY:
Radiation Exposure Index (as provided by the fluoroscopic device):
2.50 mGy Kerma

PROCEDURE:
The procedure, risks, benefits, and alternatives were explained to
the patient. Questions regarding the procedure were encouraged and
answered. The patient understands and consents to the procedure.

CERVICAL EPIDURAL INJECTION

An interlaminar approach was performed on the left at C7-T1. A
inch 20 gauge epidural needle was advanced using loss-of-resistance
technique.

DIAGNOSTIC EPIDURAL INJECTION

Injection of Isovue-M 300 shows a good epidural pattern with spread
above and below the level of needle placement bilaterally. No
vascular opacification is seen.

THERAPEUTIC EPIDURAL INJECTION

1.5 ml of Kenalog 40 mixed with 2 ml of normal saline were then
instilled. The procedure was well-tolerated, and the patient was
discharged thirty minutes following the injection in good condition.
IMPRESSION: Technically successful interlaminar epidural injection on the left
at C7-T1.

## 2022-01-25 NOTE — Discharge Instructions (Signed)

## 2022-03-01 ENCOUNTER — Telehealth: Payer: Self-pay | Admitting: Family Medicine

## 2022-03-01 NOTE — Telephone Encounter (Signed)
LVM instructions for pt to call office to speak to [facility] about appt scheduling.

## 2022-03-01 NOTE — Telephone Encounter (Signed)
Last documented mammogram 2015; confirmed by pt.  Per pt she prefers to be scheduled at [facility]. Appt made for 03/08/22 at 2:30 while pt was on phone; pt aware.

## 2022-03-01 NOTE — Telephone Encounter (Signed)
Error. Please disregard

## 2022-03-08 ENCOUNTER — Encounter: Payer: Self-pay | Admitting: Family Medicine

## 2022-03-14 ENCOUNTER — Encounter: Payer: Self-pay | Admitting: Family Medicine

## 2022-03-14 ENCOUNTER — Telehealth: Payer: Self-pay | Admitting: Family Medicine

## 2022-03-14 NOTE — Telephone Encounter (Signed)
Patient would like Dr. Tamala Julian to order the neck MRI they have been discussing via MyChart.

## 2022-03-15 ENCOUNTER — Other Ambulatory Visit: Payer: Self-pay

## 2022-03-15 DIAGNOSIS — M542 Cervicalgia: Secondary | ICD-10-CM

## 2022-03-15 MED ORDER — ESTRADIOL 0.05 MG/24HR TD PTTW: 1.0000 | MEDICATED_PATCH | TRANSDERMAL | 2 refills | Status: DC

## 2022-03-15 NOTE — Telephone Encounter (Signed)
MRI order and patient notified.

## 2022-03-16 LAB — HM MAMMOGRAPHY

## 2022-03-18 ENCOUNTER — Encounter: Payer: Self-pay | Admitting: Family Medicine

## 2022-03-23 ENCOUNTER — Ambulatory Visit
Admission: RE | Admit: 2022-03-23 | Discharge: 2022-03-23 | Disposition: A | Discharge status: Home / Self Care | Payer: 59 | Source: Ambulatory Visit | Attending: Family Medicine | Admitting: Family Medicine

## 2022-03-23 DIAGNOSIS — M542 Cervicalgia: Secondary | ICD-10-CM

## 2022-03-29 ENCOUNTER — Encounter: Payer: Self-pay | Admitting: Family Medicine

## 2022-04-14 ENCOUNTER — Other Ambulatory Visit: Payer: Self-pay

## 2022-04-14 DIAGNOSIS — M5412 Radiculopathy, cervical region: Secondary | ICD-10-CM

## 2022-04-19 ENCOUNTER — Ambulatory Visit
Admission: RE | Admit: 2022-04-19 | Discharge: 2022-04-19 | Disposition: A | Discharge status: Home / Self Care | Payer: 59 | Source: Ambulatory Visit | Attending: Family Medicine | Admitting: Family Medicine

## 2022-04-19 DIAGNOSIS — M5412 Radiculopathy, cervical region: Secondary | ICD-10-CM

## 2022-04-19 MED ORDER — TRIAMCINOLONE ACETONIDE 40 MG/ML IJ SUSP (RADIOLOGY)
60.0000 mg | Freq: Once | INTRAMUSCULAR | Status: AC
  Administered 2022-04-19: 60 mg via EPIDURAL

## 2022-04-19 MED ORDER — IOPAMIDOL (ISOVUE-M 300) INJECTION 61%
1.0000 mL | Freq: Once | INTRAMUSCULAR | Status: AC
  Administered 2022-04-19: 1 mL via EPIDURAL

## 2022-04-19 NOTE — Discharge Instructions (Signed)

## 2022-05-25 ENCOUNTER — Encounter: Payer: Self-pay | Admitting: *Deleted

## 2022-06-13 ENCOUNTER — Other Ambulatory Visit: Payer: Self-pay | Admitting: Family Medicine

## 2022-06-20 ENCOUNTER — Encounter: Payer: Self-pay | Admitting: Family Medicine

## 2022-06-21 ENCOUNTER — Other Ambulatory Visit: Payer: Self-pay

## 2022-06-21 MED ORDER — IMIPRAMINE HCL 25 MG PO TABS: ORAL_TABLET | ORAL | 3 refills | Status: DC

## 2022-06-21 MED ORDER — ESTRADIOL 0.05 MG/24HR TD PTTW: 1.0000 | MEDICATED_PATCH | TRANSDERMAL | 11 refills | Status: DC

## 2022-06-21 MED ORDER — IMIPRAMINE HCL 25 MG PO TABS: ORAL_TABLET | ORAL | 0 refills | Status: DC

## 2022-06-21 NOTE — Telephone Encounter (Signed)
Refill sent for both medications   Eulas Post MD [facility]

## 2022-06-27 ENCOUNTER — Other Ambulatory Visit: Payer: Self-pay

## 2022-06-27 ENCOUNTER — Emergency Department (HOSPITAL_COMMUNITY)
Admission: EM | Admit: 2022-06-27 | Discharge: 2022-06-27 | Disposition: A | Discharge status: Home / Self Care | Payer: 59 | Attending: Emergency Medicine | Admitting: Emergency Medicine

## 2022-06-27 ENCOUNTER — Encounter (HOSPITAL_COMMUNITY): Payer: Self-pay

## 2022-06-27 DIAGNOSIS — K2901 Acute gastritis with bleeding: Secondary | ICD-10-CM | POA: Diagnosis not present

## 2022-06-27 DIAGNOSIS — K2971 Gastritis, unspecified, with bleeding: Secondary | ICD-10-CM

## 2022-06-27 DIAGNOSIS — K920 Hematemesis: Secondary | ICD-10-CM

## 2022-06-27 DIAGNOSIS — R1110 Vomiting, unspecified: Secondary | ICD-10-CM | POA: Diagnosis present

## 2022-06-27 HISTORY — DX: Spinal stenosis, cervical region: M48.02

## 2022-06-27 HISTORY — DX: Dizziness and giddiness: R42

## 2022-06-27 LAB — CBC
HCT: 43.5 % (ref 36.0–46.0)
Hemoglobin: 13.9 g/dL (ref 12.0–15.0)
MCH: 29 pg (ref 26.0–34.0)
MCHC: 32 g/dL (ref 30.0–36.0)
MCV: 90.6 fL (ref 80.0–100.0)
Platelets: 363 10*3/uL (ref 150–400)
RBC: 4.8 MIL/uL (ref 3.87–5.11)
RDW: 13.7 % (ref 11.5–15.5)
WBC: 7.4 10*3/uL (ref 4.0–10.5)
nRBC: 0 % (ref 0.0–0.2)

## 2022-06-27 LAB — I-STAT BETA HCG BLOOD, ED (MC, WL, AP ONLY): I-stat hCG, quantitative: <5 m[IU]/mL (ref <5)

## 2022-06-27 LAB — COMPREHENSIVE METABOLIC PANEL
ALT: 20 U/L (ref 0–44)
AST: 21 U/L (ref 15–41)
Albumin: 4.4 g/dL (ref 3.5–5.0)
Alkaline Phosphatase: 62 U/L (ref 38–126)
Anion gap: 11 (ref 5–15)
BUN: 17 mg/dL (ref 6–20)
CO2: 25 mmol/L (ref 22–32)
Calcium: 8.8 mg/dL — ABNORMAL LOW (ref 8.9–10.3)
Chloride: 104 mmol/L (ref 98–111)
Creatinine, Ser: 0.82 mg/dL (ref 0.44–1.00)
GFR, Estimated: >60 mL/min (ref >60)
Glucose, Bld: 108 mg/dL — ABNORMAL HIGH (ref 70–99)
Potassium: 3.6 mmol/L (ref 3.5–5.1)
Sodium: 140 mmol/L (ref 135–145)
Total Bilirubin: 0.6 mg/dL (ref 0.3–1.2)
Total Protein: 7.1 g/dL (ref 6.5–8.1)

## 2022-06-27 LAB — LIPASE, BLOOD: Lipase: 33 U/L (ref 11–51)

## 2022-06-27 MED ORDER — ONDANSETRON 4 MG PO TBDP
4.0000 mg | ORAL_TABLET | Freq: Once | ORAL | Status: AC
  Administered 2022-06-27: 4 mg via ORAL
  Filled 2022-06-27: qty 1

## 2022-06-27 MED ORDER — ONDANSETRON 4 MG PO TBDP: 4.0000 mg | ORAL_TABLET | Freq: Three times a day (TID) | ORAL | 0 refills | Status: AC | PRN

## 2022-06-27 NOTE — ED Provider Triage Note (Signed)
Emergency Medicine Provider Triage Evaluation Note  Kelly Gilbert , a 61 y.o. female  was evaluated in triage.  Pt complains of black/coffee ground emesis Saturday night. Around 4-5 shots of vodka.   Infrequent drinker.   She states she had several episodes of emesis. States she ate very little yesterday and had one episode of emesis that was NML.   Hx of REFLUX and on PPI.   Takes lots of excedrin for her frequent headaches.    Review of Systems  Positive: Nausea, fatigue Negative: Fever, pain  Physical Exam  BP (!) 145/92 (BP Location: Left Arm)   Pulse 66   Temp 98.2 F (36.8 C) (Oral)   Resp 18   Ht 5' 6.5" (1.689 m)   Wt 74.8 kg   SpO2 100%   BMI 26.23 kg/m  Gen:   Awake, no distress   Resp:  Normal effort  MSK:   Moves extremities without difficulty  Other:  Dry oral mucosa. Mild LUQ ttp.   Medical Decision Making  Medically screening exam initiated at 1:59 PM.  Appropriate orders placed.  Kelly Gilbert was informed that the remainder of the evaluation will be completed by another provider, this initial triage assessment does not replace that evaluation, and the importance of remaining in the ED until their evaluation is complete.  [address]   Pati Gallo Maish Vaya, [place] 06/27/22 1404

## 2022-06-27 NOTE — Discharge Instructions (Addendum)
Please restart your Prilosec  I have written you prescription for Zofran to use as needed for nausea.  Make sure you are drinking plenty of water.  Follow-up with your primary care doctor have also given you the information for the [facility] gastroenterology group.  Refrain from drinking any additional alcohol, refrain from taking NSAIDs such as aspirin, ibuprofen, meloxicam, naproxen

## 2022-06-27 NOTE — ED Triage Notes (Addendum)
Patient reports that she had been drinking 2 days ago and during the night she threw up black coffee ground emesis. Patient has had emesis the next day, but was not coffee ground. Patient reports that she does take a lot of Excedrin.  Patient also c/o nausea, headache, and dizziness. Patient reports a history of vertigo.

## 2022-06-27 NOTE — ED Provider Notes (Signed)
[facility]-EMERGENCY DEPT Provider Note   CSN: [id number] Arrival date & time: 06/27/22  1247     History  Chief Complaint  Patient presents with   Hematemesis    Kelly Gilbert is a 61 y.o. female.  HPI A 61 year old female with past medical history significant for depression, reflux, migraines, ADHD,   Patient is present emergency room today with complaints of black/coffee-ground emesis that occurred 3 nights ago.  She states she drank approximately 5 shots of vodka but states that these were mixed in with cranberry juice.  She states she has an infrequent drinker but seems to drink somewhat heavily when she does she states she drinks less than once a week.  She states that she has had several episodes of emesis over the night from Saturday to Sunday.  She states she ate very little yesterday and had 1 episode of nonbloody nonbilious emesis yesterday.  She has a history of reflux and takes Prilosec but has not taken this in several days.  She takes "lots "of NSAIDs/Excedrin for frequent headache.  She denies any chest pain or difficulty breathing.  No lightheadedness or dizziness.  No dark or tarry stool.  No urinary frequency urgency dysuria hematuria.  No other associate symptoms.  No aggravating factors.  She has been nauseous today but without any episodes of emesis       Home Medications Prior to Admission medications   Medication Sig Start Date End Date Taking? Authorizing Provider  ondansetron (ZOFRAN-ODT) 4 MG disintegrating tablet Take 1 tablet (4 mg total) by mouth every 8 (eight) hours as needed for nausea or vomiting. 06/27/22  Yes Huntley Demedeiros S, PA  amphetamine-dextroamphetamine (ADDERALL) 15 MG tablet Take 1 tablet by mouth 2 (two) times daily. May refill in one month 01/24/17   Burchette, Elberta Fortis, MD  Aspirin-Acetaminophen-Caffeine (EXCEDRIN PO) Take 1 tablet by mouth daily as needed (headache).     [provider]  Biotin 5000  MCG CAPS Take 1 capsule by mouth every evening.     [provider]  Jeanie Cooks Serrata (BOSWELLIA PO) Take by mouth.    [provider]  Calcium Carb-Cholecalciferol (CALCIUM 600 + D PO) Take by mouth daily.    [provider]  cholecalciferol (VITAMIN D3) 25 MCG (1000 UNIT) tablet Take 1,000 Units by mouth daily.    [provider]  co-enzyme Q-10 30 MG capsule Take 30 mg by mouth 3 (three) times daily.    [provider]  estradiol (VIVELLE-DOT) 0.05 MG/24HR patch Place 1 patch (0.05 mg total) onto the skin 2 (two) times a week. 06/23/22   Burchette, Elberta Fortis, MD  imipramine (TOFRANIL) 25 MG tablet TAKE ONE TABLET BY MOUTH DAILY AT BEDTIME 06/21/22   Burchette, Elberta Fortis, MD  L-LYSINE PO Take 1,000 mg by mouth daily.    [provider]  magnesium 30 MG tablet Take 30 mg by mouth daily.    [provider]  Multiple Vitamin (MULTIVITAMIN PO) Take 1 capsule by mouth daily.    [provider]  naproxen (NAPROSYN) 500 MG tablet Take 1 tablet (500 mg total) by mouth 2 (two) times daily as needed. 01/18/22   Judi Saa, DO  omeprazole (PRILOSEC) 20 MG capsule Take 20 mg by mouth daily.    [provider]  PENNSAID 2 % SOLN APPLY TWO PUMPS (40MG ) TOPICALLY TO AFFECTED AREA(S) TWICE A DAY 05/18/20   Judi Saa, DO  triamcinolone cream (KENALOG) 0.1 %  Apply 1 application topically 2 (two) times daily. For 1-2 weeks. 02/20/18   Burchette, Elberta Fortis, MD  TURMERIC PO Take 2 capsules by mouth.    [provider]  vitamin C (ASCORBIC ACID) 250 MG tablet Take 500 mg by mouth daily.    [provider]      Allergies    Codeine sulfate and Sulfa antibiotics    Review of Systems   Review of Systems  Physical Exam Updated Vital Signs BP (!) 166/84   Pulse 72   Temp 98.1 F (36.7 C) (Oral)   Resp 16   Ht 5' 6.5" (1.689 m)   Wt 74.8 kg   SpO2 100%   BMI 26.23 kg/m  Physical Exam Vitals and nursing  note reviewed.  Constitutional:      General: She is not in acute distress. HENT:     Head: Normocephalic and atraumatic.     Nose: Nose normal.  Eyes:     General: No scleral icterus.    Comments: Normal conjunctiva  Cardiovascular:     Rate and Rhythm: Normal rate and regular rhythm.     Pulses: Normal pulses.     Heart sounds: Normal heart sounds.  Pulmonary:     Effort: Pulmonary effort is normal. No respiratory distress.     Breath sounds: No wheezing.  Abdominal:     Palpations: Abdomen is soft.     Tenderness: There is no abdominal tenderness. There is no guarding or rebound.  Musculoskeletal:     Cervical back: Normal range of motion.     Right lower leg: No edema.     Left lower leg: No edema.  Skin:    General: Skin is warm and dry.     Capillary Refill: Capillary refill takes less than 2 seconds.  Neurological:     Mental Status: She is alert. Mental status is at baseline.  Psychiatric:        Mood and Affect: Mood normal.        Behavior: Behavior normal.     ED Results / Procedures / Treatments   Labs (all labs ordered are listed, but only abnormal results are displayed) Labs Reviewed  COMPREHENSIVE METABOLIC PANEL - Abnormal; Notable for the following components:      Result Value   Glucose, Bld 108 (*)    Calcium 8.8 (*)    All other components within normal limits  LIPASE, BLOOD  CBC  URINALYSIS, ROUTINE W REFLEX MICROSCOPIC  I-STAT BETA HCG BLOOD, ED ([facility], [facility], [facility] ONLY)    EKG None  Radiology No results found.  Procedures Procedures    Medications Ordered in ED Medications  ondansetron (ZOFRAN-ODT) disintegrating tablet 4 mg (4 mg Oral Given 06/27/22 1408)    ED Course/ Medical Decision Making/ A&P                           Medical Decision Making Amount and/or Complexity of Data Reviewed Labs: ordered.  Risk Prescription drug management.   This patient presents to the ED for concern of hematemesis, nausea, vomiting, this  involves a number of treatment options, and is a complaint that carries with it a moderate to high risk of complications and morbidity. A differential diagnosis was considered for the patient's symptoms which is discussed below:   I considered a broad differential diagnosis for patient's hematemesis.  Given that she has had an episode of emesis yesterday it was not nonbloody have  low suspicion for this being a acute emergent condition such as a variceal hemorrhage.  She also drinks infrequently but seems to have issues when she does drink.  This calls in the question of possibility of gastritis versus peptic ulcer disease within the second to be less likely given the lack of melena.     Co morbidities: Discussed in HPI   Brief History:  A 61 year old female with past medical history significant for depression, reflux, migraines, ADHD,   Patient is present emergency room today with complaints of black/coffee-ground emesis that occurred 3 nights ago.  She states she drank approximately 5 shots of vodka but states that these were mixed in with cranberry juice.  She states she has an infrequent drinker but seems to drink somewhat heavily when she does she states she drinks less than once a week.  She states that she has had several episodes of emesis over the night from Saturday to Sunday.  She states she ate very little yesterday and had 1 episode of nonbloody nonbilious emesis yesterday.  She has a history of reflux and takes Prilosec but has not taken this in several days.  She takes "lots "of NSAIDs/Excedrin for frequent headache.  She denies any chest pain or difficulty breathing.  No lightheadedness or dizziness.  No dark or tarry stool.  No urinary frequency urgency dysuria hematuria.  No other associate symptoms.  No aggravating factors.  She has been nauseous today but without any episodes of emesis  Physical exam unremarkable.  No abdominal tenderness    EMR reviewed including pt  PMHx, past surgical history and past visits to ER.   See HPI for more details   Lab Tests:   I ordered and independently interpreted labs. Labs notable for CBC without anemia hemoglobin stable.  CMP unremarkable no elevation in BUN.  Lipase within normal limits i-STAT Hg to pregnancy.  No urinary symptoms well not a patient wait for urinalysis.   Imaging Studies:  No imaging studies ordered for this patient    Cardiac Monitoring:  NA NA   Medicines ordered:  I ordered medication including Zofran for nausea Reevaluation of the patient after these medicines showed that the patient resolved I have reviewed the patients home medicines and have made adjustments as needed   Critical Interventions:     Consults/Attending Physician      Reevaluation:  After the interventions noted above I re-evaluated patient and found that they have :resolved   Social Determinants of Health:      Problem List / ED Course:  Hematemesis now resolved.  Nonbloody emesis yesterday.  Abdominal exam benign.  Doubt variceal hemorrhage or other emergent condition.  Suspect gastritis versus peptic ulcer disease favor the former.  She will restart her PPI that she has been prescribed for reflux.  Will prescribe Zofran for any nausea she experiences.  Recommend follow-up with [facility].  Return precautions discussed   Dispostion:  After consideration of the diagnostic results and the patients response to treatment, I feel that the patent would benefit from discharge.     Final Clinical Impression(s) / ED Diagnoses Final diagnoses:  Hematemesis with nausea  Gastritis with hemorrhage, unspecified chronicity, unspecified gastritis type    Rx / DC Orders ED Discharge Orders          Ordered    ondansetron (ZOFRAN-ODT) 4 MG disintegrating tablet  Every 8 hours PRN        11 /20/23 2047  Solon Augusta [place], [place] 06/27/22 2056    Vanetta Mulders, MD 06/29/22 1811

## 2022-06-28 ENCOUNTER — Telehealth: Payer: Self-pay

## 2022-06-28 NOTE — Telephone Encounter (Signed)
Patients husband called into the office following up from patients ED visit, he stated the patient was advised to have a GI referral based on ED findings.   The patient has an appointment scheduled for tomorrow at 61 with Dr. Elease Hashimoto, but the Husband would like to know if the appointment is required to have the referral sent instead he would like to know if it could be sent based on the ED finding.  Pt husband would like a call back at [phone] with the decision.   Patients  husbands Meagan Ancona would like to know if  referral could be sent  to  Dr. Carol Ada Address: [address], [place], [place] [address] Phone: [phone]

## 2022-06-29 ENCOUNTER — Ambulatory Visit (INDEPENDENT_AMBULATORY_CARE_PROVIDER_SITE_OTHER): Payer: 59 | Admitting: Family Medicine

## 2022-06-29 ENCOUNTER — Encounter: Payer: Self-pay | Admitting: Family Medicine

## 2022-06-29 VITALS — BP 144/80 | HR 70 | Temp 98.1°F | Ht 66.5 in | Wt 168.2 lb

## 2022-06-29 DIAGNOSIS — R030 Elevated blood-pressure reading, without diagnosis of hypertension: Secondary | ICD-10-CM

## 2022-06-29 DIAGNOSIS — K920 Hematemesis: Secondary | ICD-10-CM | POA: Diagnosis not present

## 2022-06-29 DIAGNOSIS — K922 Gastrointestinal hemorrhage, unspecified: Secondary | ICD-10-CM

## 2022-06-29 NOTE — Telephone Encounter (Signed)
Patient's husband informed of the message and expressed understanding

## 2022-06-29 NOTE — Patient Instructions (Signed)
Increase the Prilosec to 40 mg daily  I will set up follow up with Dr Benson Norway.   Follow up immediately for any recurrent vomiting or increased abdominal pain.

## 2022-06-29 NOTE — Progress Notes (Signed)
Established Patient Office Visit  Subjective   Patient ID: Kelly Gilbert, female    DOB: Oct 08, 1960  Age: 61 y.o. MRN: [id number]  Chief Complaint  Patient presents with   Hospitalization Follow-up    HPI   Kelly Gilbert is seen for ER follow-up.  Couple days ago on the 20th she went to the ER with some vomiting of coffee-ground type emesis.  Symptoms started on Saturday and continued into Sunday and then she was seen on Monday.  Recurrent episodes of vomiting.  Prior to onset she had had 5 shots of vodka mixed with cranberry juice.  No melena.  She also takes frequent nonsteroidals.  Denies history of ulcer.  No significant abdominal pain currently.  Still some nausea but is also having some vertigo symptoms when turning head to the left.  No chest pains.  No orthostatic symptoms.  ER labs reviewed.  Lipase was normal.  CMP and CBC unremarkable.  Hemoglobin 13.9.  She takes Prilosec 20 mg daily at baseline.  It was recommended she follow-up with GI.  We received a note yesterday from her husband requesting referral to Dr. Carol Ada..  Past Medical History:  Diagnosis Date   Arthritis    Cervical spinal stenosis    GERD (gastroesophageal reflux disease)    Lesion of bladder    Migraine headache    PONV (postoperative nausea and vomiting)    Vertigo    Past Surgical History:  Procedure Laterality Date   BENIGN LEFT BREASET BX  1992   COLONOSCOPY     CYSTOSCOPY WITH BIOPSY  01/16/2012   Procedure: CYSTOSCOPY WITH BIOPSY;  Surgeon: Molli Hazard, MD;  Location: [facility];  Service: Urology;  Laterality: N/A;  1 hour requested for this case  [place].    reports that she quit smoking about 2 years ago. Her smoking use included cigarettes. She has a 8.75 pack-year smoking history. She has never used smokeless tobacco. She reports current alcohol use of about 2.0 standard drinks of alcohol per week. She  reports that she does not use drugs. family history includes Cancer in her father; Cancer (age of onset: 83) in her mother; Hypertension in her father and maternal grandmother. Allergies  Allergen Reactions   Codeine Sulfate Nausea And Vomiting   Sulfa Antibiotics Hives and Swelling    Review of Systems  Constitutional:  Negative for chills and fever.  Respiratory:  Negative for cough.   Cardiovascular:  Negative for chest pain.  Gastrointestinal:  Positive for nausea. Negative for abdominal pain, blood in stool and melena.  Neurological:  Positive for dizziness.      Objective:     BP (!) 144/80 (BP Location: Left Arm, Patient Position: Sitting, Cuff Size: Normal)   Pulse 70   Temp 98.1 F (36.7 C) (Oral)   Ht 5' 6.5" (1.689 m)   Wt 168 lb 3.2 oz (76.3 kg)   SpO2 99%   BMI 26.74 kg/m    Physical Exam Vitals reviewed.  Constitutional:      Appearance: Normal appearance.  Cardiovascular:     Rate and Rhythm: Normal rate and regular rhythm.  Pulmonary:     Effort: Pulmonary effort is normal.     Breath sounds: Normal breath sounds.  Abdominal:     Palpations: Abdomen is soft. There is no mass.     Tenderness: There is no abdominal tenderness. There is no guarding  or rebound.  Neurological:     Mental Status: She is alert.      No results found for any visits on 06/29/22.    The ASCVD Risk score (Arnett DK, et al., 2019) failed to calculate for the following reasons:   Cannot find a previous HDL lab   Cannot find a previous total cholesterol lab    Assessment & Plan:   #1 recent episode of hematemesis following binge with vodka.  She has not had evidence for ongoing melena.  Frequent use of nonsteroidals.  Suspected gastritis.  -Set up GI referral -Avoid all nonsteroidals for now -Increase Prilosec to 40 mg daily until GI follow-up -Follow-up immediately for any recurrent hematemesis or any abdominal pain or other concerns  #2 elevated blood pressure  without history of hypertension.  We recommend she monitor closely over the next month and be in touch if consistently greater than 140/90.   No follow-ups on file.    Carolann Littler, MD

## 2022-07-06 ENCOUNTER — Encounter: Payer: Self-pay | Admitting: Family Medicine

## 2022-07-07 MED ORDER — UBRELVY 100 MG PO TABS: ORAL_TABLET | ORAL | 2 refills | Status: DC

## 2022-07-07 NOTE — Telephone Encounter (Signed)
Noted  

## 2022-07-15 MED ORDER — ELETRIPTAN HYDROBROMIDE 40 MG PO TABS: ORAL_TABLET | ORAL | 0 refills | Status: DC

## 2022-07-15 NOTE — Addendum Note (Signed)
Addended by: Nilda Riggs on: 07/15/2022 03:16 PM   Modules accepted: Orders

## 2022-07-15 NOTE — Addendum Note (Signed)
Addended by: Rosalee Kaufman L on: 07/15/2022 11:40 AM   Modules accepted: Orders

## 2022-10-19 ENCOUNTER — Encounter: Payer: Self-pay | Admitting: Adult Health

## 2022-10-19 ENCOUNTER — Ambulatory Visit (INDEPENDENT_AMBULATORY_CARE_PROVIDER_SITE_OTHER): Payer: BC Managed Care – PPO | Admitting: Adult Health

## 2022-10-19 VITALS — BP 118/82 | HR 84 | Temp 98.2°F | Ht 66.5 in | Wt 176.0 lb

## 2022-10-19 DIAGNOSIS — G43819 Other migraine, intractable, without status migrainosus: Secondary | ICD-10-CM

## 2022-10-19 DIAGNOSIS — R030 Elevated blood-pressure reading, without diagnosis of hypertension: Secondary | ICD-10-CM | POA: Diagnosis not present

## 2022-10-19 NOTE — Progress Notes (Signed)
Subjective:    Patient ID: Kelly Gilbert, female    DOB: [birth year], 62 y.o.   MRN: [id number]  HPI 62 year old female who  has a past medical history of Arthritis, Cervical spinal stenosis, GERD (gastroesophageal reflux disease), Lesion of bladder, Migraine headache, PONV (postoperative nausea and vomiting), and Vertigo.  She is a patient of Dr. Elease Hashimoto who I am seeing today for an acute issue. She reports that yesterday she went on a walk of about 5.5 miles, when she returned home she developed a migraine headache, had nausea, and some mild shortness of breath.  She took an Excedrin Migraine, hydrated and laid down.  She did start to feel better, 2 hours later she started checking her blood pressure and noticed that it was elevated and has been checking multiple times throughout the day over the last 24 hours and has had readings in the 0000000 to [id number] systolic.  Seen a wrist cuff, was not using it correctly.  She woke up this morning she did feel better but continued to have a mild headache and took another Excedrin Migraine.  Today in the office her initial blood pressure reading was 112/80 and second blood pressure reading 118/82.  Showing her how to use her blood pressure cuff correctly we had a reading of 124/79.    BP Readings from Last 3 Encounters:  10/19/22 118/82  06/29/22 (!) 144/80  06/27/22 (!) 166/84   Review of Systems See HPI   Past Medical History:  Diagnosis Date   Arthritis    Cervical spinal stenosis    GERD (gastroesophageal reflux disease)    Lesion of bladder    Migraine headache    PONV (postoperative nausea and vomiting)    Vertigo     Social History   Socioeconomic History   Marital status: Married    Spouse name: Not on file   Number of children: 2   Years of education: Not on file   Highest education level: Bachelor's degree (e.g., BA, AB, BS)  Occupational History    Comment: Realtor  Tobacco Use   Smoking status: Former    Packs/day:  0.25    Years: 35.00    Total pack years: 8.75    Types: Cigarettes    Quit date: 06/26/2020    Years since quitting: 2.3   Smokeless tobacco: Never   Tobacco comments:    QUIT SMOKING 2008--  RECENTLY STARTED BACK 7-8 CIG. PER DAY  Vaping Use   Vaping Use: Never used  Substance and Sexual Activity   Alcohol use: Yes    Alcohol/week: 2.0 standard drinks of alcohol    Types: 2 Shots of liquor per week    Comment: Occasional   Drug use: Never   Sexual activity: Not on file  Other Topics Concern   Not on file  Social History Narrative   Not on file   Social Determinants of Health   Financial Resource Strain: Low Risk  (01/21/2022)   Overall Financial Resource Strain (CARDIA)    Difficulty of Paying Living Expenses: Not hard at all  Food Insecurity: No Food Insecurity (01/21/2022)   Hunger Vital Sign    Worried About Running Out of Food in the Last Year: Never true    [place] in the Last Year: Never true  Transportation Needs: No Transportation Needs (01/21/2022)   PRAPARE - Hydrologist (Medical): No    Lack of Transportation (Non-Medical): No  Physical  Activity: Insufficiently Active (01/21/2022)   Exercise Vital Sign    Days of Exercise per Week: 1 day    Minutes of Exercise per Session: 50 min  Stress: Stress Concern Present (01/21/2022)   [place]    Feeling of Stress : To some extent  Social Connections: Moderately Integrated (01/21/2022)   Social Connection and Isolation Panel [NHANES]    Frequency of Communication with Friends and Family: Twice a week    Frequency of Social Gatherings with Friends and Family: Twice a week    Attends Religious Services: 1 to 4 times per year    Active Member of [employer] or Organizations: No    Attends Music therapist: Not on file    Marital Status: Married  Human resources officer Violence: Not on file    Past Surgical  History:  Procedure Laterality Date   BENIGN LEFT BREASET [place] WITH BIOPSY  01/16/2012   Procedure: CYSTOSCOPY WITH BIOPSY;  Surgeon: Molli Hazard, MD;  Location: [facility];  Service: Urology;  Laterality: N/A;  1 hour requested for this case  [place].    Family History  Problem Relation Age of Onset   Hypertension Father    Cancer Father        prostate   Hypertension Maternal Grandmother    Cancer Mother 78       renal cell cancer   Colon cancer Neg Hx    Pancreatic cancer Neg Hx    Stomach cancer Neg Hx    Esophageal cancer Neg Hx     Allergies  Allergen Reactions   Codeine Sulfate Nausea And Vomiting   Sulfa Antibiotics Hives and Swelling    Current Outpatient Medications on File Prior to Visit  Medication Sig Dispense Refill   amphetamine-dextroamphetamine (ADDERALL) 15 MG tablet Take 1 tablet by mouth 2 (two) times daily. May refill in one month 60 tablet 0   Aspirin-Acetaminophen-Caffeine (EXCEDRIN PO) Take 1 tablet by mouth daily as needed (headache).      Biotin 5000 MCG CAPS Take 1 capsule by mouth every evening.      Boswellia Serrata (BOSWELLIA PO) Take by mouth.     Calcium Carb-Cholecalciferol (CALCIUM 600 + D PO) Take by mouth daily.     cholecalciferol (VITAMIN D3) 25 MCG (1000 UNIT) tablet Take 1,000 Units by mouth daily.     co-enzyme Q-10 30 MG capsule Take 30 mg by mouth 3 (three) times daily.     estradiol (VIVELLE-DOT) 0.05 MG/24HR patch Place 1 patch (0.05 mg total) onto the skin 2 (two) times a week. 8 patch 11   imipramine (TOFRANIL) 25 MG tablet TAKE ONE TABLET BY MOUTH DAILY AT BEDTIME 90 tablet 3   L-LYSINE PO Take 1,000 mg by mouth daily.     magnesium 30 MG tablet Take 30 mg by mouth daily.     Multiple Vitamin (MULTIVITAMIN PO) Take 1 capsule by mouth daily.     omeprazole (PRILOSEC) 20 MG capsule Take 20 mg by mouth daily.      ondansetron (ZOFRAN-ODT) 4 MG disintegrating tablet Take 1 tablet (4 mg total) by mouth every 8 (eight) hours as needed for nausea or vomiting. 20 tablet 0   PENNSAID 2 % SOLN APPLY TWO PUMPS ('40MG'$ ) TOPICALLY TO AFFECTED AREA(S) TWICE A DAY 112 g 2  triamcinolone cream (KENALOG) 0.1 % Apply 1 application topically 2 (two) times daily. For 1-2 weeks. 45 g 0   TURMERIC PO Take 2 capsules by mouth.     vitamin C (ASCORBIC ACID) 250 MG tablet Take 500 mg by mouth daily.     Current Facility-Administered Medications on File Prior to Visit  Medication Dose Route Frequency Provider Last Rate Last Admin   0.9 %  sodium chloride infusion  500 mL Intravenous Once Pyrtle, Lajuan Lines, MD        BP 118/82   Pulse 84   Temp 98.2 F (36.8 C) (Oral)   Ht 5' 6.5" (1.689 m)   Wt 176 lb (79.8 kg)   SpO2 98%   BMI 27.98 kg/m       Objective:   Physical Exam Vitals and nursing note reviewed.  Constitutional:      Appearance: Normal appearance.  Cardiovascular:     Rate and Rhythm: Normal rate and regular rhythm.     Pulses: Normal pulses.     Heart sounds: Normal heart sounds.  Pulmonary:     Effort: Pulmonary effort is normal.     Breath sounds: Normal breath sounds.  Skin:    General: Skin is warm and dry.  Neurological:     General: No focal deficit present.     Mental Status: She is alert and oriented to person, place, and time.  Psychiatric:        Mood and Affect: Mood normal.        Behavior: Behavior normal.        Thought Content: Thought content normal.        Judgment: Judgment normal.       Assessment & Plan:  1. Elevated blood pressure reading -Blood pressure well-controlled in the office today.  I think she was getting abnormal readings due only using her blood pressure cuff wrong.  Will have her continue to monitor her blood pressure at home  2. Other migraine without status migrainosus, intractable - Continue with OTC medication   Dorothyann Peng, NP

## 2023-04-24 DIAGNOSIS — Z1231 Encounter for screening mammogram for malignant neoplasm of breast: Secondary | ICD-10-CM | POA: Diagnosis not present

## 2023-04-24 LAB — HM MAMMOGRAPHY

## 2023-04-25 ENCOUNTER — Encounter: Payer: Self-pay | Admitting: Family Medicine

## 2023-07-10 ENCOUNTER — Other Ambulatory Visit: Payer: Self-pay | Admitting: Family Medicine

## 2023-07-28 ENCOUNTER — Encounter: Payer: Self-pay | Admitting: Family Medicine

## 2023-07-28 ENCOUNTER — Ambulatory Visit (INDEPENDENT_AMBULATORY_CARE_PROVIDER_SITE_OTHER): Payer: BC Managed Care – PPO | Admitting: Family Medicine

## 2023-07-28 VITALS — BP 128/80 | HR 75 | Temp 98.1°F | Wt 174.5 lb

## 2023-07-28 DIAGNOSIS — Z5181 Encounter for therapeutic drug level monitoring: Secondary | ICD-10-CM | POA: Diagnosis not present

## 2023-07-28 DIAGNOSIS — F988 Other specified behavioral and emotional disorders with onset usually occurring in childhood and adolescence: Secondary | ICD-10-CM | POA: Diagnosis not present

## 2023-07-28 DIAGNOSIS — Z79890 Hormone replacement therapy (postmenopausal): Secondary | ICD-10-CM

## 2023-07-28 DIAGNOSIS — M79674 Pain in right toe(s): Secondary | ICD-10-CM

## 2023-07-28 MED ORDER — AMPHETAMINE-DEXTROAMPHETAMINE 15 MG PO TABS: 15.0000 mg | ORAL_TABLET | Freq: Two times a day (BID) | ORAL | 0 refills | Status: DC

## 2023-07-28 MED ORDER — ESTRADIOL 0.05 MG/24HR TD PTTW: 1.0000 | MEDICATED_PATCH | TRANSDERMAL | 11 refills | Status: DC

## 2023-07-28 NOTE — Progress Notes (Unsigned)
Established Patient Office Visit  Subjective   Patient ID: TURKESSA HENNEN, female    DOB: 11-May-1961  Age: 62 y.o. MRN: [id number]  Chief Complaint  Patient presents with   Toe Pain    Patient complains of right great toe pain, x4 months    HPI  {History (Optional):23778} Rosey Bath has history of GERD, ADD, migraine headaches.  She is seen today with right great toe pain for about 4 months.  She first noticed this when she was up in [place] doing quite a bit of walking.  She noticed a burning type pain on the volar surface of the right great toe near the base of the toe.  This was distal to the MTP joint.  No visible swelling.  Has had some persistent pain with walking since that time.  This has greatly limited her activities.  She denies any specific injury.  Denies any hyperextension type injury.  She states this is a burning or stinging quality.  Generally not at rest.  She requests refill of estradiol patch.  Had mammogram in September which was unremarkable.  She has been on this for several years.  She is requesting refill of Adderall.  She has history of ADD but does not take this on a regular basis.  Past Medical History:  Diagnosis Date   Arthritis    Cervical spinal stenosis    GERD (gastroesophageal reflux disease)    Lesion of bladder    Migraine headache    PONV (postoperative nausea and vomiting)    Vertigo    Past Surgical History:  Procedure Laterality Date   BENIGN LEFT BREASET BX  1992   COLONOSCOPY     CYSTOSCOPY WITH BIOPSY  01/16/2012   Procedure: CYSTOSCOPY WITH BIOPSY;  Surgeon: Milford Cage, MD;  Location: [facility];  Service: Urology;  Laterality: N/A;  1 hour requested for this case  C-ARM CAMERA     VAGINAL HYSTERECTOMY  1999   EPIDURAL ANES.    reports that she quit smoking about 3 years ago. Her smoking use included cigarettes. She started smoking about 38 years ago. She has a 8.8 pack-year smoking history. She  has never used smokeless tobacco. She reports current alcohol use of about 2.0 standard drinks of alcohol per week. She reports that she does not use drugs. family history includes Cancer in her father; Cancer (age of onset: 12) in her mother; Hypertension in her father and maternal grandmother. Allergies  Allergen Reactions   Codeine Sulfate Nausea And Vomiting   Sulfa Antibiotics Hives and Swelling      Review of Systems  Constitutional:  Negative for malaise/fatigue.  Eyes:  Negative for blurred vision.  Respiratory:  Negative for shortness of breath.   Cardiovascular:  Negative for chest pain.  Neurological:  Negative for dizziness, weakness and headaches.      Objective:     BP 128/80 (BP Location: Left Arm, Cuff Size: Normal)   Pulse 75   Temp 98.1 F (36.7 C) (Oral)   Wt 174 lb 8 oz (79.2 kg)   SpO2 98%   BMI 27.74 kg/m  BP Readings from Last 3 Encounters:  07/28/23 128/80  10/19/22 118/82  06/29/22 (!) 144/80   Wt Readings from Last 3 Encounters:  07/28/23 174 lb 8 oz (79.2 kg)  10/19/22 176 lb (79.8 kg)  06/29/22 168 lb 3.2 oz (76.3 kg)      Physical Exam Vitals reviewed.  Constitutional:  Appearance: She is well-developed.  Eyes:     Pupils: Pupils are equal, round, and reactive to light.  Neck:     Thyroid: No thyromegaly.     Vascular: No JVD.  Cardiovascular:     Rate and Rhythm: Normal rate and regular rhythm.     Heart sounds:     No gallop.  Pulmonary:     Effort: Pulmonary effort is normal. No respiratory distress.     Breath sounds: Normal breath sounds. No wheezing or rales.  Musculoskeletal:     Cervical back: Neck supple.     Comments: Right great toe examined.  No bony tenderness.  No MTP joint tenderness.  Full range of motion.  Superficial callus distal aspect of the right great toe.  Nontender.  No signs of infection.  No interdigital tenderness  Neurological:     Mental Status: She is alert.      No results found for any  visits on 07/28/23.  {Labs (Optional):23779}  The ASCVD Risk score (Arnett DK, et al., 2019) failed to calculate for the following reasons:   Cannot find a previous HDL lab   Cannot find a previous total cholesterol lab    Assessment & Plan:   #1   47-month history of right great toe pain.  No history of injury.  She is describing a burning or stinging type discomfort which suggest neuropathic origin.  Etiology unclear.  Will set up podiatry referral for further evaluation  #2 postmenopausal.  History of hot flashes.  Patient on estradiol patch.  Requesting refills.  Continue annual mammograms.  Refill sent  #3 history of ADD.  Infrequently takes Adderall.  Refill provided.  She did have initial blood pressure up today but repeat after rest was normal.  Continue close monitoring   No follow-ups on file.    Evelena Peat, MD

## 2023-08-10 ENCOUNTER — Encounter: Payer: Self-pay | Admitting: Podiatry

## 2023-08-10 ENCOUNTER — Ambulatory Visit: Payer: BC Managed Care – PPO

## 2023-08-10 ENCOUNTER — Ambulatory Visit (INDEPENDENT_AMBULATORY_CARE_PROVIDER_SITE_OTHER): Payer: BC Managed Care – PPO | Admitting: Podiatry

## 2023-08-10 DIAGNOSIS — M62461 Contracture of muscle, right lower leg: Secondary | ICD-10-CM | POA: Diagnosis not present

## 2023-08-10 DIAGNOSIS — S90111A Contusion of right great toe without damage to nail, initial encounter: Secondary | ICD-10-CM

## 2023-08-10 DIAGNOSIS — S8491XA Injury of unspecified nerve at lower leg level, right leg, initial encounter: Secondary | ICD-10-CM | POA: Diagnosis not present

## 2023-08-10 DIAGNOSIS — M205X1 Other deformities of toe(s) (acquired), right foot: Secondary | ICD-10-CM

## 2023-08-10 MED ORDER — LIDOCAINE-PRILOCAINE 2.5-2.5 % EX CREA: 1.0000 | TOPICAL_CREAM | CUTANEOUS | 0 refills | Status: AC | PRN

## 2023-08-10 NOTE — Patient Instructions (Signed)
Do exercises exactly as told by your health care provider and adjust them as directed. It is normal to feel mild stretching, pulling, tightness, or discomfort as you do these exercises. Stop the exercise right away if you feel sudden pain or your pain gets worse.   Stretching exercises These exercises improve the movement and flexibility of your calf muscles. These exercises may also help to relieve pain and stiffness. Standing gastroc stretch  This exercise is also called a standing calf (gastroc) stretch. Stand with your hands against a wall. Extend your left / right leg behind you, and bend your front knee slightly. Your heels should be on the floor. Keeping your heels on the floor and your back knee straight, shift your weight toward the wall. You should feel a gentle stretch in the back of your lower leg (calf). Hold this position for 10 seconds. Repeat 10 times. Complete this exercise 2 times a day. Gastroc and soleus stretch, standing This is an exercise in which you stand on a step and use your body weight to stretch your calf muscles. To do this exercise: Stand with the ball of your left / right foot on a step. The ball of your foot is on the walking surface, right under your toes. Keep your other foot firmly on the same step. Hold on to the wall, a railing, or a chair for balance. Slowly lift your other foot, allowing your body weight to press your left / right heel down over the edge of the step. You should feel a stretch in your left / right calf. Hold this position for 10 seconds. Return both feet to the step. Repeat this exercise with a slight bend in your left / right knee. Repeat 10 times. Complete this exercise 2 times a day. Strengthening exercise This exercise builds strength and endurance in your foot muscles and may help to take pressure off your heel. Endurance is the ability to use your muscles for a long time, even after they get tired. Arch lifts This exercise is  sometimes called foot intrinsics. This is an exercise in which you lift the arch part of your foot only. To do this exercise: Sit in a chair with your feet flat on the floor. Keeping your big toe and your heel on the floor, lift only your arch, which is on the inner edge of your left / right foot. Do not move your knee or scrunch your toes. This is a small movement. Hold this position for 10 seconds. Return to the starting position. Repeat 10 times. Complete this exercise 2 times a day.  

## 2023-08-11 NOTE — Progress Notes (Signed)
 Subjective:  Patient ID: Kelly Gilbert, female    DOB: 1960/09/12,  MRN: [id number]  Chief Complaint  Patient presents with   Callouses    Patient is here for right great toe pain, feels like blister that burns and has sharp pains while wearing shoes or active    Discussed the use of AI scribe software for clinical note transcription with the patient, who gave verbal consent to proceed.  History of Present Illness   The patient, with an unspecified medical history, presented with a chief complaint of persistent pain in the big toe. The discomfort began during a trip to [place]  in late July or early August, described as a burning sensation on the bottom of the toe, making walking difficult. The pain is localized to a specific area on the toe, but no visible abnormalities were noted.  Various self-care measures were attempted, including wrapping the toe with moleskin, applying medicated pads, and soaking the foot in Epsom salt. The first Epsom salt soak resulted in a skin reaction, and the second soak intensified the pain in the affected area. These soaks occurred approximately six weeks prior to the consultation.  The patient noted a peculiar gait on the right foot, with a tendency for the big toe to lift during walking. The pain is not present when walking barefoot, but certain types of footwear, such as boots and wedge sandals, exacerbate the discomfort. Tennis shoes seem to alleviate the pain somewhat.  The patient also reported a sensation of the toe being on fire when walking with certain shoes on. The pain has been ongoing for several months, from July to January, and has slightly improved over time. The patient expressed frustration over the inability to walk comfortably, which has impacted her physical activity level.          Objective:    Physical Exam   MUSCULOSKELETAL: Good range of motion in the first metatarsophalangeal (MTP) joint and first interphalangeal (IP)  joint of the big toe. No palpable bony prominence in the area of concern. Mild tenderness on palpation of the plantar medial cutaneous nerve on the plantar medial hallux. No pain on palpation of the sesamoids. No bony deformity noted. NEUROLOGICAL: Mild pain and palpation on the dorsal plantar medial nerve. No radiation distally or proximally. No ecchymosis, bruising, or deformity noted.       No images are attached to the encounter.    Results   Procedure: X-ray of the foot  RADIOLOGY Foot X-ray: No bony abnormality, no fracture, no joint abnormality (08/10/2023)      Assessment:   1. Contusion of right great toe without damage to nail, initial encounter   2. Neurapraxia of right lower extremity, initial encounter   3. Gastrocnemius equinus of right lower extremity   4. Hallux extensus, acquired, right      Plan:  Patient was evaluated and treated and all questions answered.  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 X-ray results show no bony abnormality, and her pain has slightly improved over several months. We will continue activity and shoe wear as tolerated. She will trial a silicone offloading pad and stretching exercises for hallux extensis. Imlicrim will be applied topically as needed for nerve pain. She has opted to avoid injection therapy at this time.  Return if symptoms worsen or fail to improve.

## 2023-08-14 ENCOUNTER — Telehealth: Payer: Self-pay

## 2023-08-14 NOTE — Telephone Encounter (Signed)
I called and left detailed message for patient

## 2023-08-14 NOTE — Telephone Encounter (Signed)
 PA request received from [facility] on 08/10/2023 for Lidocaine-Prilocaine 2.5*-2.5% cream. PA was submitted through covermymeds.   PA was denied because there is no record of patient trying and failing OTC lidocaine

## 2023-08-22 ENCOUNTER — Encounter: Payer: Self-pay | Admitting: Acute Care

## 2023-09-18 ENCOUNTER — Other Ambulatory Visit: Payer: Self-pay | Admitting: Family Medicine

## 2023-10-31 ENCOUNTER — Other Ambulatory Visit: Payer: Self-pay | Admitting: Family Medicine

## 2023-10-31 ENCOUNTER — Encounter: Payer: Self-pay | Admitting: Family Medicine

## 2023-11-01 ENCOUNTER — Encounter: Payer: Self-pay | Admitting: Family Medicine

## 2023-11-01 MED ORDER — AMPHETAMINE-DEXTROAMPHETAMINE 15 MG PO TABS: 15.0000 mg | ORAL_TABLET | Freq: Two times a day (BID) | ORAL | 0 refills | Status: DC

## 2024-01-22 ENCOUNTER — Other Ambulatory Visit: Payer: Self-pay

## 2024-01-22 ENCOUNTER — Ambulatory Visit (INDEPENDENT_AMBULATORY_CARE_PROVIDER_SITE_OTHER): Admitting: Family Medicine

## 2024-01-22 ENCOUNTER — Telehealth: Payer: Self-pay | Admitting: Family Medicine

## 2024-01-22 ENCOUNTER — Encounter: Payer: Self-pay | Admitting: Family Medicine

## 2024-01-22 VITALS — BP 124/76 | HR 72 | Temp 97.4°F | Ht 66.54 in | Wt 177.2 lb

## 2024-01-22 DIAGNOSIS — Z0000 Encounter for general adult medical examination without abnormal findings: Secondary | ICD-10-CM

## 2024-01-22 DIAGNOSIS — Z87891 Personal history of nicotine dependence: Secondary | ICD-10-CM

## 2024-01-22 DIAGNOSIS — Z122 Encounter for screening for malignant neoplasm of respiratory organs: Secondary | ICD-10-CM

## 2024-01-22 MED ORDER — ESTRADIOL 0.05 MG/24HR TD PTTW: 1.0000 | MEDICATED_PATCH | TRANSDERMAL | 11 refills | Status: DC

## 2024-01-22 MED ORDER — AMPHETAMINE-DEXTROAMPHETAMINE 15 MG PO TABS: 15.0000 mg | ORAL_TABLET | Freq: Two times a day (BID) | ORAL | 0 refills | Status: DC

## 2024-01-22 NOTE — Progress Notes (Signed)
 Established Patient Office Visit  Subjective   Patient ID: Kelly Gilbert, female    DOB: 30-Mar-1961  Age: 63 y.o. MRN: [id number]  Chief Complaint  Patient presents with   Annual Exam    HPI   Kelly Gilbert is seen today for physical exam.  Generally doing well.  Unfortunately, her Kelly Gilbert just passed away earlier this year possibly from complications of small cell lung cancer.  She had multiple other medical problems as well.  She has ADD and requesting refill of Adderall.  Also uses estrogen patch.  Had previous partial hysterectomy.  She is getting regular mammograms.  Health maintenance reviewed  Health Maintenance  Topic Date Due   HIV Screening  Never done   Zoster Vaccines- Shingrix (1 of 2) Never done   COVID-19 Vaccine (3 - 2024-25 season) 04/09/2023   Hepatitis C Screening  01/21/2025 (Originally 09/30/1980)   INFLUENZA VACCINE  03/08/2024   DTaP/Tdap/Td (3 - Td or Tdap) 04/04/2024   MAMMOGRAM  04/23/2024   Colonoscopy  10/28/2029   HPV VACCINES  Aged Out   Meningococcal B Vaccine  Aged Out   - She has not had Shingrix nor Prevnar 20 -She plans to set up repeat mammogram this year -Has done low-dose CT lung cancer screening but not since 2022.  She would like to get reinitiated.  She quit smoking back in 2019.  Family history-Kelly Gilbert just died recently age 31.  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  Brother with type 2 diabetes.  Father is 90 and has history of hypertension and sick sinus syndrome but recently healthy.  Social history-she is married and has 2 daughters.  No grandchildren.  But smoking 2019.  No regular alcohol.  Past Medical History:  Diagnosis Date   Arthritis    Cervical spinal stenosis    GERD (gastroesophageal reflux disease)    Lesion of bladder    Migraine headache    PONV (postoperative nausea and vomiting)    Vertigo     Past Surgical History:  Procedure Laterality Date   BENIGN LEFT BREASET BX  1992   COLONOSCOPY     CYSTOSCOPY WITH BIOPSY  01/16/2012   Procedure: CYSTOSCOPY WITH BIOPSY;  Surgeon: Soledad Dupes, MD;  Location: [facility];  Service: Urology;  Laterality: N/A;  1 hour requested for this case  C-ARM CAMERA     VAGINAL HYSTERECTOMY  1999   EPIDURAL ANES.    reports that she quit smoking about 3 years ago. Her smoking use included cigarettes. She started smoking about 38 years ago. She has a 8.8 pack-year smoking history. She has never used smokeless tobacco. She reports current alcohol use of about 2.0 standard drinks of alcohol per week. She reports that she does not use drugs. family history includes Cancer in her father; Cancer (age of onset: 87) in her Kelly Gilbert; Hypertension in her father and maternal grandmother. Allergies  Allergen Reactions   Codeine Sulfate Nausea And Vomiting   Sulfa Antibiotics Hives and Swelling    Review of Systems  Constitutional:  Negative for chills, fever, malaise/fatigue and weight loss.  HENT:  Negative for hearing loss.   Eyes:  Negative for blurred vision and double vision.  Respiratory:  Negative for cough and shortness of breath.   Cardiovascular:  Negative for chest pain, palpitations and leg swelling.  Gastrointestinal:  Negative for abdominal pain, blood in stool,  constipation and diarrhea.  Genitourinary:  Negative for dysuria.  Skin:  Negative for rash.  Neurological:  Negative for dizziness, speech change, seizures, loss of consciousness and headaches.  Psychiatric/Behavioral:  Negative for depression.       Objective:     BP 124/76 (BP Location: Left Arm, Patient Position: Sitting, Cuff Size: Normal)   Pulse 72   Temp (!) 97.4 F (36.3 C) (Oral)   Ht 5' 6.54 (1.69 m)   Wt 177 lb 3.2 oz (80.4 kg)   SpO2 98%   BMI 28.14 kg/m  BP Readings from Last 3 Encounters:  01/22/24 124/76  07/28/23 128/80   10/19/22 118/82   Wt Readings from Last 3 Encounters:  01/22/24 177 lb 3.2 oz (80.4 kg)  07/28/23 174 lb 8 oz (79.2 kg)  10/19/22 176 lb (79.8 kg)      Physical Exam Vitals reviewed.  Constitutional:      Appearance: She is well-developed.  HENT:     Head: Normocephalic and atraumatic.   Eyes:     Pupils: Pupils are equal, round, and reactive to light.   Neck:     Thyroid : No thyromegaly.   Cardiovascular:     Rate and Rhythm: Normal rate and regular rhythm.     Heart sounds: Normal heart sounds. No murmur heard. Pulmonary:     Effort: No respiratory distress.     Breath sounds: Normal breath sounds. No wheezing or rales.  Abdominal:     General: Bowel sounds are normal. There is no distension.     Palpations: Abdomen is soft. There is no mass.     Tenderness: There is no abdominal tenderness. There is no guarding or rebound.   Musculoskeletal:        General: Normal range of motion.     Cervical back: Normal range of motion and neck supple.     Right lower leg: No edema.     Left lower leg: No edema.  Lymphadenopathy:     Cervical: No cervical adenopathy.   Skin:    Findings: No rash.   Neurological:     Mental Status: She is alert and oriented to person, place, and time.     Cranial Nerves: No cranial nerve deficit.   Psychiatric:        Behavior: Behavior normal.        Thought Content: Thought content normal.        Judgment: Judgment normal.      No results found for any visits on 01/22/24.  Last CBC Lab Results  Component Value Date   WBC 7.4 06/27/2022   HGB 13.9 06/27/2022   HCT 43.5 06/27/2022   MCV 90.6 06/27/2022   MCH 29.0 06/27/2022   RDW 13.7 06/27/2022   PLT 363 06/27/2022   Last metabolic panel Lab Results  Component Value Date   GLUCOSE 108 (H) 06/27/2022   NA 140 06/27/2022   K 3.6 06/27/2022   CL 104 06/27/2022   CO2 25 06/27/2022   BUN 17 06/27/2022   CREATININE 0.82 06/27/2022   GFRNONAA >60 06/27/2022   CALCIUM  8.8 (L) 06/27/2022   PROT 7.1 06/27/2022   ALBUMIN 4.4 06/27/2022   BILITOT 0.6 06/27/2022   ALKPHOS 62 06/27/2022   AST 21 06/27/2022   ALT 20 06/27/2022   ANIONGAP 11 06/27/2022   Last lipids Lab Results  Component Value Date   CHOL 206 (H) 12/18/2015   HDL 62.30 12/18/2015   LDLCALC 125 (H) 12/18/2015  LDLDIRECT 146.3 03/12/2013   TRIG 94.0 12/18/2015   CHOLHDL 3 12/18/2015   Last thyroid  functions Lab Results  Component Value Date   TSH 0.93 12/18/2015      The ASCVD Risk score (Arnett DK, et al., 2019) failed to calculate for the following reasons:   Cannot find a previous HDL lab   Cannot find a previous total cholesterol lab    Assessment & Plan:   Problem List Items Addressed This Visit   None Visit Diagnoses       Physical exam    -  Primary   Relevant Orders   CBC with Differential/Platelet   Lipid panel   Basic metabolic panel with GFR   Hepatic function panel   Hemoglobin A1c     Ms. Scotto has chronic issues as above.  Quit smoking 2019.  We discussed several health maintenance items as below  -Recommend labs including A1c with her strong family history of type 2 diabetes in Kelly Gilbert and brother. - Recommend Shingrix vaccine.  She will check on insurance coverage - Recommend she consider Prevnar 20 specially with her smoking history - She plans to set up repeat mammogram - Colonoscopy up-to-date - She has had previous partial hysterectomy so no indication for repeat Pap smears - Set up referral for repeat low-dose CT lung cancer screen - Consider annual flu vaccine - Consider possible coronary calcium score but will check on labs above first  No follow-ups on file.    Glean Lamy, MD

## 2024-01-22 NOTE — Telephone Encounter (Signed)
 Patient stopped at the desk and asked for a refill on:  amphetamine -dextroamphetamine  (ADDERALL) 15 MG tablet   [id number] - [place], [place] - [address] Phone: [phone]  Fax: [phone]     Please advise at [phone]

## 2024-01-22 NOTE — Patient Instructions (Signed)
 Consider vaccines- Prevnar 20 (pneumonia vaccine) and Shingrix  Set up repeat mammogram  I will set up repeat low dose CT lung cancer screen.

## 2024-01-23 ENCOUNTER — Ambulatory Visit: Payer: Self-pay | Admitting: Family Medicine

## 2024-01-23 LAB — LIPID PANEL
Cholesterol: 208 mg/dL — ABNORMAL HIGH (ref <200)
HDL: 85 mg/dL (ref >=50)
LDL Cholesterol (Calc): 106 mg/dL — ABNORMAL HIGH
Non-HDL Cholesterol (Calc): 123 mg/dL (ref <130)
Total CHOL/HDL Ratio: 2.4 (calc) (ref <5.0)
Triglycerides: 83 mg/dL (ref <150)

## 2024-01-23 LAB — CBC WITH DIFFERENTIAL/PLATELET
Absolute Lymphocytes: 1940 {cells}/uL (ref 850–3900)
Absolute Monocytes: 458 {cells}/uL (ref 200–950)
Basophils Absolute: 61 {cells}/uL (ref 0–200)
Basophils Relative: 1 %
Eosinophils Absolute: 171 {cells}/uL (ref 15–500)
Eosinophils Relative: 2.8 %
HCT: 40 % (ref 35.0–45.0)
Hemoglobin: 12.5 g/dL (ref 11.7–15.5)
MCH: 28 pg (ref 27.0–33.0)
MCHC: 31.3 g/dL — ABNORMAL LOW (ref 32.0–36.0)
MCV: 89.5 fL (ref 80.0–100.0)
MPV: 9.7 fL (ref 7.5–12.5)
Monocytes Relative: 7.5 %
Neutro Abs: 3471 {cells}/uL (ref 1500–7800)
Neutrophils Relative %: 56.9 %
Platelets: 411 10*3/uL — ABNORMAL HIGH (ref 140–400)
RBC: 4.47 10*6/uL (ref 3.80–5.10)
RDW: 14.3 % (ref 11.0–15.0)
Total Lymphocyte: 31.8 %
WBC: 6.1 10*3/uL (ref 3.8–10.8)

## 2024-01-23 LAB — HEPATIC FUNCTION PANEL
AG Ratio: 1.9 (calc) (ref 1.0–2.5)
ALT: 13 U/L (ref 6–29)
AST: 17 U/L (ref 10–35)
Albumin: 4.4 g/dL (ref 3.6–5.1)
Alkaline phosphatase (APISO): 51 U/L (ref 37–153)
Bilirubin, Direct: 0.1 mg/dL (ref 0.0–0.2)
Globulin: 2.3 g/dL (ref 1.9–3.7)
Indirect Bilirubin: 0.2 mg/dL (ref 0.2–1.2)
Total Bilirubin: 0.3 mg/dL (ref 0.2–1.2)
Total Protein: 6.7 g/dL (ref 6.1–8.1)

## 2024-01-23 LAB — BASIC METABOLIC PANEL WITH GFR
BUN: 20 mg/dL (ref 7–25)
CO2: 26 mmol/L (ref 20–32)
Calcium: 9.8 mg/dL (ref 8.6–10.4)
Chloride: 105 mmol/L (ref 98–110)
Creat: 0.82 mg/dL (ref 0.50–1.05)
Glucose, Bld: 96 mg/dL (ref 65–99)
Potassium: 5.2 mmol/L (ref 3.5–5.3)
Sodium: 141 mmol/L (ref 135–146)
eGFR: 81 mL/min/{1.73_m2} (ref >=60)

## 2024-01-23 LAB — HEMOGLOBIN A1C
Hgb A1c MFr Bld: 5.9 % — ABNORMAL HIGH (ref <5.7)
Mean Plasma Glucose: 123 mg/dL
eAG (mmol/L): 6.8 mmol/L

## 2024-01-24 ENCOUNTER — Telehealth: Payer: Self-pay

## 2024-01-24 NOTE — Telephone Encounter (Signed)
 Copied from CRM [id number]. Topic: Clinical - Medical Advice >> Jan 24, 2024 10:39 AM Kelly Gilbert wrote: Reason for CRM: pt called back to speak with Michekal  pt is requesting a call back at [phone]

## 2024-01-24 NOTE — Telephone Encounter (Signed)
 Please see result note

## 2024-02-14 ENCOUNTER — Ambulatory Visit
Admission: RE | Admit: 2024-02-14 | Discharge: 2024-02-14 | Disposition: A | Discharge status: Home / Self Care | Source: Ambulatory Visit | Attending: Acute Care | Admitting: Acute Care

## 2024-02-14 DIAGNOSIS — Z87891 Personal history of nicotine dependence: Secondary | ICD-10-CM

## 2024-02-14 DIAGNOSIS — Z122 Encounter for screening for malignant neoplasm of respiratory organs: Secondary | ICD-10-CM

## 2024-02-18 ENCOUNTER — Encounter: Payer: Self-pay | Admitting: Family Medicine

## 2024-02-26 ENCOUNTER — Other Ambulatory Visit: Payer: Self-pay | Admitting: Acute Care

## 2024-02-26 DIAGNOSIS — Z122 Encounter for screening for malignant neoplasm of respiratory organs: Secondary | ICD-10-CM

## 2024-02-26 DIAGNOSIS — Z87891 Personal history of nicotine dependence: Secondary | ICD-10-CM

## 2024-03-01 ENCOUNTER — Ambulatory Visit (INDEPENDENT_AMBULATORY_CARE_PROVIDER_SITE_OTHER): Admitting: Family Medicine

## 2024-03-01 ENCOUNTER — Ambulatory Visit: Payer: Self-pay | Admitting: Family Medicine

## 2024-03-01 VITALS — BP 100/66 | HR 83 | Temp 97.8°F | Ht 66.54 in | Wt 177.7 lb

## 2024-03-01 DIAGNOSIS — N951 Menopausal and female climacteric states: Secondary | ICD-10-CM | POA: Diagnosis not present

## 2024-03-01 DIAGNOSIS — L659 Nonscarring hair loss, unspecified: Secondary | ICD-10-CM | POA: Diagnosis not present

## 2024-03-01 DIAGNOSIS — I700 Atherosclerosis of aorta: Secondary | ICD-10-CM | POA: Diagnosis not present

## 2024-03-01 DIAGNOSIS — F988 Other specified behavioral and emotional disorders with onset usually occurring in childhood and adolescence: Secondary | ICD-10-CM

## 2024-03-01 DIAGNOSIS — Z79890 Hormone replacement therapy (postmenopausal): Secondary | ICD-10-CM

## 2024-03-01 LAB — VITAMIN D 25 HYDROXY (VIT D DEFICIENCY, FRACTURES): VITD: 62.73 ng/mL (ref 30.00–100.00)

## 2024-03-01 LAB — TSH: TSH: 0.83 u[IU]/mL (ref 0.35–5.50)

## 2024-03-01 MED ORDER — ROSUVASTATIN CALCIUM 10 MG PO TABS: 10.0000 mg | ORAL_TABLET | Freq: Every day | ORAL | 1 refills | Status: DC

## 2024-03-01 MED ORDER — AMPHETAMINE-DEXTROAMPHETAMINE 15 MG PO TABS: 15.0000 mg | ORAL_TABLET | Freq: Two times a day (BID) | ORAL | 0 refills | Status: AC

## 2024-03-01 MED ORDER — ESTRADIOL 0.05 MG/24HR TD PTTW: 1.0000 | MEDICATED_PATCH | TRANSDERMAL | 3 refills | Status: AC

## 2024-03-01 NOTE — Patient Instructions (Signed)
 Consider trial of topical Minoxidil 5%.

## 2024-03-01 NOTE — Progress Notes (Signed)
 Established Patient Office Visit  Subjective   Patient ID: Kelly Gilbert, female    DOB: 23-Jun-1961  Age: 63 y.o. MRN: [id number]  Chief Complaint  Patient presents with   Medical Management of Chronic Issues    Pt is here for CT scan and blood work for hair loss.     HPI   Kelly Gilbert is seen today for several issues as follows  She called with really several years of diffuse alopecia wanting to discuss possible therapies.  She has tried multiple different over-the-counter products to no avail.  She is specifically had questions about minoxidil.  Has not had recent thyroid  functions or other labs to check for vitamin D or iron deficiency.  Recent low-dose CT lung cancer screen.  There is mention of aortic atherosclerosis on exam.  She had coronary calcium score 2020 of 0.  Does have significant family history of stroke and vascular disease.  Last LDL cholesterol low 100 range.  Requesting refills of her Adderall.  Current dose seems to be working well.  Also requesting 65-month supply of estrogen patches.  She is getting yearly mammograms.  Past Medical History:  Diagnosis Date   Arthritis    Cervical spinal stenosis    GERD (gastroesophageal reflux disease)    Lesion of bladder    Migraine headache    PONV (postoperative nausea and vomiting)    Vertigo    Past Surgical History:  Procedure Laterality Date   BENIGN LEFT BREASET BX  1992   COLONOSCOPY     CYSTOSCOPY WITH BIOPSY  01/16/2012   Procedure: CYSTOSCOPY WITH BIOPSY;  Surgeon: Toribio Neysa Repine, MD;  Location: [facility];  Service: Urology;  Laterality: N/A;  1 hour requested for this case  C-ARM CAMERA     VAGINAL HYSTERECTOMY  1999   EPIDURAL ANES.    reports that she quit smoking about 3 years ago. Her smoking use included cigarettes. She started smoking about 38 years ago. She has a 8.8 pack-year smoking history. She has never used smokeless tobacco. She reports current alcohol use of  about 2.0 standard drinks of alcohol per week. She reports that she does not use drugs. family history includes Cancer in her father; Cancer (age of onset: 68) in her mother; Hypertension in her father and maternal grandmother. Allergies  Allergen Reactions   Codeine Sulfate Nausea And Vomiting   Sulfa Antibiotics Hives and Swelling    Review of Systems  Constitutional:  Negative for malaise/fatigue.  Eyes:  Negative for blurred vision.  Respiratory:  Negative for shortness of breath.   Cardiovascular:  Negative for chest pain.  Neurological:  Negative for dizziness, weakness and headaches.      Objective:     BP 100/66 (BP Location: Left Arm, Patient Position: Sitting, Cuff Size: Normal)   Pulse 83   Temp 97.8 F (36.6 C) (Oral)   Ht 5' 6.54 (1.69 m)   Wt 177 lb 11.2 oz (80.6 kg)   SpO2 94%   BMI 28.22 kg/m  BP Readings from Last 3 Encounters:  03/01/24 100/66  01/22/24 124/76  07/28/23 128/80   Wt Readings from Last 3 Encounters:  03/01/24 177 lb 11.2 oz (80.6 kg)  01/22/24 177 lb 3.2 oz (80.4 kg)  07/28/23 174 lb 8 oz (79.2 kg)      Physical Exam Vitals reviewed.  Constitutional:      Appearance: Normal appearance.  Cardiovascular:     Rate and Rhythm: Normal rate and regular rhythm.  Skin:    Comments: General hair thinning.  No localized alopecia.  Neurological:     Mental Status: She is alert.      No results found for any visits on 03/01/24.  Last lipids Lab Results  Component Value Date   CHOL 208 (H) 01/22/2024   HDL 85 01/22/2024   LDLCALC 106 (H) 01/22/2024   LDLDIRECT 146.3 03/12/2013   TRIG 83 01/22/2024   CHOLHDL 2.4 01/22/2024      The 10-year ASCVD risk score (Arnett DK, et al., 2019) is: 1.8%    Assessment & Plan:   #1 generalized alopecia.  Going on for several years.  Check TSH, 25 hydroxy vitamin D, iron studies. - Consider trial of over-the-counter 5% topical minoxidil - Discussed other possible options such as  Aldactone but would have to watch her blood pressures that she is already on the low in - She specifically has interest in low-dose oral minoxidil if above normal which we will consider  #2 ADD-stable.  Refill Adderall 15 mg twice daily for 3 months  #3 aortic atherosclerosis-noted on recent low-dose CT lung cancer screening.  She does have mild hyperlipidemia history.  We discussed initiate rosuvastatin 10 mg daily and recheck lipid and hepatic panel in about 2 months  #4 postmenopausal on hormone replacement.  Patient requesting 78-month prescription for estrogen patches.  This will be sent in   Return in about 2 months (around 05/02/2024).    Kelly Scarlet, MD

## 2024-03-02 LAB — IRON,TIBC AND FERRITIN PANEL
%SAT: 27 % (ref 16–45)
Ferritin: 37 ng/mL (ref 16–288)
Iron: 103 ug/dL (ref 45–160)
TIBC: 375 ug/dL (ref 250–450)

## 2024-03-04 MED ORDER — MINOXIDIL 2.5 MG PO TABS: ORAL_TABLET | ORAL | 5 refills | Status: DC

## 2024-03-04 NOTE — Addendum Note (Signed)
 Addended by: MICHEAL WOLM ORN on: 03/04/2024 09:32 PM   Modules accepted: Orders

## 2024-03-04 NOTE — Telephone Encounter (Signed)
 I have sent in the Minoxidil  prescription.  Would start with one half tablet per day.   Wolm LELON Scarlet MD [facility] at [facility]

## 2024-03-14 ENCOUNTER — Encounter: Payer: Self-pay | Admitting: Internal Medicine

## 2024-03-14 ENCOUNTER — Ambulatory Visit (INDEPENDENT_AMBULATORY_CARE_PROVIDER_SITE_OTHER): Admitting: Internal Medicine

## 2024-03-14 VITALS — BP 114/60 | HR 78 | Temp 98.2°F | Ht 66.0 in | Wt 178.0 lb

## 2024-03-14 DIAGNOSIS — I700 Atherosclerosis of aorta: Secondary | ICD-10-CM | POA: Diagnosis not present

## 2024-03-14 DIAGNOSIS — J432 Centrilobular emphysema: Secondary | ICD-10-CM

## 2024-03-14 DIAGNOSIS — Z87891 Personal history of nicotine dependence: Secondary | ICD-10-CM | POA: Diagnosis not present

## 2024-03-14 NOTE — Progress Notes (Signed)
 Kelly Gilbert    [id number]    1961-07-07  Primary Care Physician:Burchette, Wolm LELON, MD  Referring Physician: Micheal Wolm LELON, MD [address] [place],  [place] [address] Reason for Consultation: shortness of breath, abnormal ct chest Date of Consultation: 03/14/2024  Chief complaint:   Chief Complaint  Patient presents with   Consult    Ct result    Shortness of Breath    On activity      HPI:  Discussed the use of AI scribe software for clinical note transcription with the patient, who gave verbal consent to proceed.  History of Present Illness Kelly Gilbert is a 63 year old female who presents for follow-up after a CT scan indicating mild emphysema and aortic atherosclerosis.  She experiences occasional shortness of breath, particularly during exertion, but it does not limit her daily activities. She has chest tightness, described as pressure, mostly at night when lying down, but it does not wake her from sleep. She sometimes notices it during the day, but it is not severe and does not limit her activities.  She has a history of bronchitis, occurring about once a year, but has not had it recently. She quit smoking in November 2021 after a long history of smoking since her teenage years, reaching a regular pack-per-day habit by college. Her smoking history and her mother's diagnosis of small cell carcinoma prompted her participation in the lung cancer screening program.  She previously used an inhaler after a pulmonary function test in December 2021 showed normal lung function, but discontinued it due to cost and lack of noticeable benefit.  She reports episodes of sudden stinging sensations followed by bruising on her wrists, hands, and ankles, which have occurred over many years. These episodes are brief, lasting about 60 seconds, and result in dark bruises.  She had a coronary calcium  score done several years ago, which was zero, indicating no  coronary calcifications at that time.    Social History   Occupational History    Comment: Realtor  Tobacco Use   Smoking status: Former    Current packs/day: 0.00    Average packs/day: 1 pack/day for 41.0 years (41.0 ttl pk-yrs)    Types: Cigarettes    Start date: 06/27/1979    Quit date: 06/26/2020    Years since quitting: 3.7   Smokeless tobacco: Never   Tobacco comments:    QUIT SMOKING 2008--  RECENTLY STARTED BACK 7-8 CIG. PER DAY  Vaping Use   Vaping status: Never Used  Substance and Sexual Activity   Alcohol use: Yes    Alcohol/week: 2.0 standard drinks of alcohol    Types: 2 Shots of liquor per week    Comment: Occasional   Drug use: Never   Sexual activity: Not on file    Relevant family history:  Family History  Problem Relation Age of Onset   Cancer Mother 67       renal cell cancer   Cancer - Lung Mother    Hypertension Father    Cancer Father        prostate   Hypertension Maternal Grandmother    Colon cancer Neg Hx    Pancreatic cancer Neg Hx    Stomach cancer Neg Hx    Esophageal cancer Neg Hx     Past Medical History:  Diagnosis Date   Arthritis    Cervical spinal stenosis    GERD (gastroesophageal reflux disease)  Lesion of bladder    Migraine headache    PONV (postoperative nausea and vomiting)    Vertigo     Past Surgical History:  Procedure Laterality Date   BENIGN LEFT BREASET BX  1992   COLONOSCOPY     CYSTOSCOPY WITH BIOPSY  01/16/2012   Procedure: CYSTOSCOPY WITH BIOPSY;  Surgeon: Toribio Neysa Repine, MD;  Location: [facility];  Service: Urology;  Laterality: N/A;  1 hour requested for this case  C-ARM CAMERA     VAGINAL HYSTERECTOMY  1999   EPIDURAL ANES.     Physical Exam: Blood pressure 114/60, pulse 78, temperature 98.2 F (36.8 C), temperature source Oral, height 5' 6 (1.676 m), weight 178 lb (80.7 kg), SpO2 100%. Gen:      No acute distress ENT:  no nasal polyps, mucus membranes  moist Lungs:    No increased respiratory effort, symmetric chest wall excursion, clear to auscultation bilaterally, no wheezes or crackles CV:         Regular rate and rhythm; no murmurs, rubs, or gallops.  No pedal edema Abd:      + bowel sounds; soft, non-tender; no distension MSK: no acute synovitis of DIP or PIP joints, no mechanics hands.  Skin:      Warm and dry; no rashes Neuro: normal speech, no focal facial asymmetry Psych: alert and oriented x3, normal mood and affect   Data Reviewed/Medical Decision Making:  Independent interpretation of tests: Imaging:  Review of patient's CT Chest July 2025 images revealed mild upper lobe centrilobular emphysema, no nodules or masses. The patient's images have been independently reviewed by me.    PFTs: I have personally reviewed the patient's PFTs and normal pulmonary function.     Latest Ref Rng & Units 07/21/2020   11:59 AM  PFT Results  FVC-Pre L 3.97   FVC-Predicted Pre % 111   FVC-Post L 3.93   FVC-Predicted Post % 110   Pre FEV1/FVC % % 83   Post FEV1/FCV % % 83   FEV1-Pre L 3.27   FEV1-Predicted Pre % 117   FEV1-Post L 3.26   DLCO uncorrected ml/min/mmHg 23.08   DLCO UNC% % 107   DLCO corrected ml/min/mmHg 23.08   DLCO COR %Predicted % 107   DLVA Predicted % 96   TLC L 5.96   TLC % Predicted % 112   RV % Predicted % 95     Labs:  Lab Results  Component Value Date   NA 141 01/22/2024   K 5.2 01/22/2024   CO2 26 01/22/2024   GLUCOSE 96 01/22/2024   BUN 20 01/22/2024   CREATININE 0.82 01/22/2024   CALCIUM  9.8 01/22/2024   GFR 94.52 12/18/2015   EGFR 81 01/22/2024   GFRNONAA >60 06/27/2022   Lab Results  Component Value Date   WBC 6.1 01/22/2024   HGB 12.5 01/22/2024   HCT 40.0 01/22/2024   MCV 89.5 01/22/2024   PLT 411 (H) 01/22/2024     Immunization status:  Immunization History  Administered Date(s) Administered   Influenza Split 05/14/2011   Influenza,inj,Quad PF,6+ Mos 04/18/2013, 04/04/2014,  07/11/2016, 05/27/2020, 05/18/2021   Janssen (J&J) SARS-COV-2 Vaccination 10/28/2019, 06/18/2020   Td 08/09/2003   Tdap 04/04/2014     I reviewed prior external note(s) from lung cancer screening, pcp  I reviewed the result(s) of the labs and imaging as noted above.   I have ordered    Assessment and Plan Assessment & Plan Emphysema, mild Mild emphysema  on CT, primarily in lung apices, not significantly affecting breathing. - Continue annual lung cancer screening with low-dose CT. Next scan due July 2026.  Aortic atherosclerosis, mild Mild aortic atherosclerosis on CT, no significant coronary calcifications. Previous coronary calcium  score zero, indicating low risk. - No cardiology referral unless symptoms or concerns arise.  History of tobacco use Former smoker, quit November 2021. Increased lung cancer risk due to smoking and family history. - Continue lung cancer screening program due to increased risk.   Return to Care: Return if symptoms worsen or fail to improve.  Verdon Gore, MD Pulmonary and Critical Care Medicine [facility] [phone]  CC: Micheal, Wolm ORN, MD

## 2024-03-14 NOTE — Patient Instructions (Signed)
 It was a pleasure to see you today!  Please schedule follow up as needed. Please call [phone] if issues or concerns arise. You can also send us  a message through MyChart, but but aware that this is not to be used for urgent issues and it may take up to 5-7 days to receive a reply. Please be aware that you will likely be able to view your results before I have a chance to respond to them. Please give us  5 business days to respond to any non-urgent results.    VISIT SUMMARY:  You came in today for a follow-up after your CT scan showed mild emphysema and aortic atherosclerosis. We discussed your symptoms, including occasional shortness of breath and chest tightness, as well as your history of smoking and bronchitis.  YOUR PLAN:  -EMPHYSEMA, MILD: Mild emphysema means there is some damage to the air sacs in your lungs, but it is not significantly affecting your breathing. We will continue with annual lung cancer screenings using low-dose CT scans. Your next scan is due in July 2026.  -AORTIC ATHEROSCLEROSIS, MILD: Mild aortic atherosclerosis means there is some plaque buildup in your aorta, but it is not currently causing significant issues. Since your previous coronary calcium  score was zero, your risk remains low. We do not need to refer you to a cardiologist unless you develop new symptoms or concerns.  -HISTORY OF TOBACCO USE: You have a history of smoking, which increases your risk for lung cancer. You quit smoking in November 2021, which is excellent. We will continue to monitor your lung health through the lung cancer screening program due to your increased risk.  INSTRUCTIONS:  Please continue with your annual lung cancer screenings. Your next low-dose CT scan is due in July 2026. If you experience any new symptoms or have concerns, please contact our office.

## 2024-04-26 LAB — HM MAMMOGRAPHY

## 2024-06-03 ENCOUNTER — Encounter: Payer: Self-pay | Admitting: Family Medicine

## 2024-06-04 MED ORDER — MINOXIDIL 2.5 MG PO TABS: 2.5000 mg | ORAL_TABLET | Freq: Every day | ORAL | 1 refills | Status: AC

## 2024-08-14 ENCOUNTER — Other Ambulatory Visit: Payer: Self-pay | Admitting: Family Medicine
